# Patient Record
Sex: Female | Born: 1980 | Hispanic: Yes | Marital: Married | Smoking: Never smoker
Health system: Southern US, Community
[De-identification: ages and names within clinical notes are randomized; demographics above are authoritative.]

## PROBLEM LIST (undated history)

## (undated) DIAGNOSIS — G43909 Migraine, unspecified, not intractable, without status migrainosus: Secondary | ICD-10-CM

## (undated) DIAGNOSIS — G932 Benign intracranial hypertension: Secondary | ICD-10-CM

## (undated) HISTORY — PX: CYST EXCISION: SHX5701

## (undated) HISTORY — PX: TUBAL LIGATION: SHX77

## (undated) HISTORY — PX: MOUTH SURGERY: SHX715

---

## 2013-05-21 ENCOUNTER — Emergency Department: Payer: Self-pay | Admitting: Emergency Medicine

## 2013-06-09 ENCOUNTER — Encounter (HOSPITAL_COMMUNITY): Payer: Self-pay | Admitting: Emergency Medicine

## 2013-06-09 ENCOUNTER — Emergency Department (HOSPITAL_COMMUNITY): Payer: Medicaid - Out of State

## 2013-06-09 ENCOUNTER — Emergency Department (HOSPITAL_COMMUNITY)
Admission: EM | Admit: 2013-06-09 | Discharge: 2013-06-10 | Disposition: A | Discharge status: Home / Self Care | Payer: Medicaid - Out of State | Attending: Emergency Medicine | Admitting: Emergency Medicine

## 2013-06-09 DIAGNOSIS — M5126 Other intervertebral disc displacement, lumbar region: Secondary | ICD-10-CM | POA: Insufficient documentation

## 2013-06-09 DIAGNOSIS — X500XXA Overexertion from strenuous movement or load, initial encounter: Secondary | ICD-10-CM | POA: Insufficient documentation

## 2013-06-09 DIAGNOSIS — Y9389 Activity, other specified: Secondary | ICD-10-CM | POA: Insufficient documentation

## 2013-06-09 DIAGNOSIS — Z8679 Personal history of other diseases of the circulatory system: Secondary | ICD-10-CM | POA: Insufficient documentation

## 2013-06-09 DIAGNOSIS — Y990 Civilian activity done for income or pay: Secondary | ICD-10-CM | POA: Insufficient documentation

## 2013-06-09 DIAGNOSIS — Z8669 Personal history of other diseases of the nervous system and sense organs: Secondary | ICD-10-CM | POA: Insufficient documentation

## 2013-06-09 DIAGNOSIS — Y9289 Other specified places as the place of occurrence of the external cause: Secondary | ICD-10-CM | POA: Insufficient documentation

## 2013-06-09 DIAGNOSIS — IMO0002 Reserved for concepts with insufficient information to code with codable children: Secondary | ICD-10-CM | POA: Insufficient documentation

## 2013-06-09 HISTORY — DX: Benign intracranial hypertension: G93.2

## 2013-06-09 HISTORY — DX: Migraine, unspecified, not intractable, without status migrainosus: G43.909

## 2013-06-09 MED ORDER — DIAZEPAM 5 MG PO TABS
5.0000 mg | ORAL_TABLET | Freq: Once | ORAL | Status: AC
Start: 2013-06-09 — End: 2013-06-09
  Administered 2013-06-09: 5 mg via ORAL
  Filled 2013-06-09: qty 1

## 2013-06-09 MED ORDER — HYDROMORPHONE HCL PF 1 MG/ML IJ SOLN
1.0000 mg | Freq: Once | INTRAMUSCULAR | Status: AC
  Administered 2013-06-09: 1 mg via INTRAMUSCULAR
  Filled 2013-06-09: qty 1

## 2013-06-09 MED ORDER — HYDROMORPHONE HCL PF 2 MG/ML IJ SOLN
2.0000 mg | Freq: Once | INTRAMUSCULAR | Status: AC
  Administered 2013-06-09: 2 mg via INTRAVENOUS
  Filled 2013-06-09: qty 1

## 2013-06-09 MED ORDER — METHOCARBAMOL 500 MG PO TABS
1000.0000 mg | ORAL_TABLET | Freq: Once | ORAL | Status: AC
  Administered 2013-06-09: 1000 mg via ORAL
  Filled 2013-06-09: qty 2

## 2013-06-09 MED ORDER — PREDNISONE 20 MG PO TABS
60.0000 mg | ORAL_TABLET | Freq: Once | ORAL | Status: AC
  Administered 2013-06-09: 60 mg via ORAL
  Filled 2013-06-09: qty 3

## 2013-06-09 MED ORDER — KETOROLAC TROMETHAMINE 30 MG/ML IJ SOLN
30.0000 mg | Freq: Once | INTRAMUSCULAR | Status: AC
  Administered 2013-06-09: 30 mg via INTRAVENOUS
  Filled 2013-06-09: qty 1

## 2013-06-09 NOTE — ED Notes (Signed)
ED PA at bedside

## 2013-06-09 NOTE — ED Notes (Signed)
Pt sts lower back pain with radiation to feet after picking up something at work today; pt appears very uncomfortable at present

## 2013-06-09 NOTE — ED Provider Notes (Signed)
CSN: [id number]     Arrival date & time 06/09/13  1849 History   First MD Initiated Contact with Patient 06/09/13 1904     Chief Complaint  Patient presents with  . Back Pain   (Consider location/radiation/quality/duration/timing/severity/associated sxs/prior Treatment) HPI 74 YOF presents to the emergency department with low back pain radiating into her legs bilaterally that began at work today. She bent over at work quickly to pick up a glass of water when she heard a "pop like a band" in her lower back. She was unable to stand up initially and was eventually able to stand up. She walked with a limp away. Pain started immediately and is a "20/10" stabbing, tingling, burning pain that radiates into her heels. She now has tingling in her heels and feet. Denies weakness, saddle anesthesia, bowel and bladder incontinence or dysfunction, numbness, fever, chills, shortness of breath, chest pain. Tried taking 3 Advil with no relief. Nothing makes the pain better; movement, standing, walking, laying for too long makes the pain worse.  Past Medical History  Diagnosis Date  . Pseudotumor cerebri   . Migraine    History reviewed. No pertinent past surgical history. History reviewed. No pertinent family history. History  Substance Use Topics  . Smoking status: Never Smoker   . Smokeless tobacco: Not on file  . Alcohol Use: Yes     Comment: occ   OB History   Grav Para Term Preterm Abortions TAB SAB Ect Mult Living                 Review of Systems All other systems negative except as documented in the HPI. All pertinent positives and negatives as reviewed in the HPI.  Allergies  Review of patient's allergies indicates no known allergies.  Home Medications   Current Outpatient Rx  Name  Route  Sig  Dispense  Refill  . ibuprofen (ADVIL,MOTRIN) 200 MG tablet   Oral   Take 600 mg by mouth daily as needed for pain.          BP 138/98  Pulse 90  Temp(Src) 97.3 F (36.3 C) (Oral)   Resp 22  SpO2 99%  LMP 05/19/2013 Physical Exam  Nursing note and vitals reviewed. Constitutional: She is oriented to person, place, and time. Vital signs are normal. She appears well-developed and well-nourished. She appears distressed.  Patient is rolling in bed moaning with pain  HENT:  Head: Normocephalic and atraumatic.  Eyes: Pupils are equal, round, and reactive to light.  Neck: Normal range of motion and full passive range of motion without pain.  Cardiovascular: Normal rate, regular rhythm, normal heart sounds, intact distal pulses and normal pulses.   Pulmonary/Chest: Effort normal and breath sounds normal.  Musculoskeletal: She exhibits tenderness.       Right knee: Normal.       Left knee: Normal.       Right ankle: Normal.       Left ankle: Normal. Achilles tendon normal.       Cervical back: Normal.       Thoracic back: Normal.       Lumbar back: She exhibits decreased range of motion, tenderness and bony tenderness. She exhibits no swelling, no edema and no deformity.       Back:       Right foot: She exhibits tenderness (tenderness worse over heel). She exhibits normal range of motion (full ROM with pain), no bony tenderness, no swelling, normal capillary refill and no crepitus.  Left foot: She exhibits tenderness (tenderness worse over heel). She exhibits normal range of motion (ROM full with pain), no bony tenderness, no swelling, normal capillary refill and no laceration.  Neurological: She is alert and oriented to person, place, and time. She has normal strength and normal reflexes. No sensory deficit.  Reflex Scores:      Patellar reflexes are 2+ on the right side and 2+ on the left side. Skin: Skin is warm and dry.  Psychiatric: She has a normal mood and affect. Her behavior is normal. Judgment and thought content normal.    ED Course  Procedures (including critical care time) Labs Review Labs Reviewed - No data to display Imaging Review Dg Lumbar Spine  Complete  06/09/2013   CLINICAL DATA:  Back pain.  EXAM: LUMBAR SPINE - COMPLETE 4+ VIEW  COMPARISON:  No priors.  FINDINGS: Five views of the lumbar spine demonstrate no definite acute displaced fractures or compression type fractures. Alignment is anatomic. Possible left-sided pars defect at L5 (likely chronic). Multiple Schmorl's nodes.  IMPRESSION: 1. No acute radiographic abnormality of the lumbar spine to account for the patient's symptoms. 2. Additional incidental findings, as above.   Electronically Signed   By: Trudie Reed M.D.   On: 06/09/2013 19:58    MDM  Patient is given the findings on MRI.  Patient is advised to followup with neurosurgery in the [facility].  The patient does not have any neurological deficits and normal reflexes.  Patient's pain has improved following treatment here in the emergency department.    Carlyle Dolly, PA-C 06/10/13 [phone]

## 2013-06-09 NOTE — ED Notes (Signed)
Patient was at work today around noon drinking from a water bottle, she dropped it and when she bent over to pick it up she heard a sound like a rubber band breaking, she was unable to move or stand up straight, she was eventually able to bend and stand straight after , she completed her shift at work and then came to the hospital for evaluation

## 2013-06-10 MED ORDER — HYDROMORPHONE HCL PF 2 MG/ML IJ SOLN
2.0000 mg | Freq: Once | INTRAMUSCULAR | Status: DC
  Filled 2013-06-10: qty 1

## 2013-06-10 MED ORDER — PREDNISONE 50 MG PO TABS: 50.0000 mg | ORAL_TABLET | Freq: Every day | ORAL | Status: DC

## 2013-06-10 MED ORDER — OXYCODONE-ACETAMINOPHEN 10-325 MG PO TABS: 1.0000 | ORAL_TABLET | ORAL | Status: DC | PRN

## 2013-06-10 MED ORDER — DIAZEPAM 5 MG PO TABS: 5.0000 mg | ORAL_TABLET | Freq: Three times a day (TID) | ORAL | Status: DC | PRN

## 2013-06-10 NOTE — ED Notes (Signed)
Pt returned from MRI °

## 2013-06-11 ENCOUNTER — Emergency Department (HOSPITAL_COMMUNITY)
Admission: EM | Admit: 2013-06-11 | Discharge: 2013-06-11 | Disposition: A | Discharge status: Home / Self Care | Payer: Medicaid - Out of State | Attending: Emergency Medicine | Admitting: Emergency Medicine

## 2013-06-11 ENCOUNTER — Encounter (HOSPITAL_COMMUNITY): Payer: Self-pay | Admitting: *Deleted

## 2013-06-11 DIAGNOSIS — M5126 Other intervertebral disc displacement, lumbar region: Secondary | ICD-10-CM

## 2013-06-11 DIAGNOSIS — M79609 Pain in unspecified limb: Secondary | ICD-10-CM | POA: Insufficient documentation

## 2013-06-11 DIAGNOSIS — M549 Dorsalgia, unspecified: Secondary | ICD-10-CM

## 2013-06-11 DIAGNOSIS — M545 Low back pain, unspecified: Secondary | ICD-10-CM | POA: Insufficient documentation

## 2013-06-11 DIAGNOSIS — G932 Benign intracranial hypertension: Secondary | ICD-10-CM | POA: Insufficient documentation

## 2013-06-11 DIAGNOSIS — Z8669 Personal history of other diseases of the nervous system and sense organs: Secondary | ICD-10-CM | POA: Insufficient documentation

## 2013-06-11 MED ORDER — HYDROMORPHONE HCL PF 1 MG/ML IJ SOLN
1.0000 mg | Freq: Once | INTRAMUSCULAR | Status: AC
  Administered 2013-06-11: 1 mg via INTRAMUSCULAR
  Filled 2013-06-11: qty 1

## 2013-06-11 MED ORDER — KETOROLAC TROMETHAMINE 60 MG/2ML IM SOLN
60.0000 mg | Freq: Once | INTRAMUSCULAR | Status: AC
  Administered 2013-06-11: 60 mg via INTRAMUSCULAR
  Filled 2013-06-11: qty 2

## 2013-06-11 MED ORDER — MORPHINE SULFATE 4 MG/ML IJ SOLN: 4.0000 mg | Freq: Once | INTRAMUSCULAR | Status: DC

## 2013-06-11 MED ORDER — METHOCARBAMOL 100 MG/ML IJ SOLN
500.0000 mg | Freq: Once | INTRAMUSCULAR | Status: AC
  Administered 2013-06-11: 500 mg via INTRAMUSCULAR
  Filled 2013-06-11: qty 5

## 2013-06-11 MED ORDER — HYDROMORPHONE HCL PF 2 MG/ML IJ SOLN
2.0000 mg | Freq: Once | INTRAMUSCULAR | Status: AC
  Administered 2013-06-11: 2 mg via INTRAMUSCULAR
  Filled 2013-06-11: qty 1

## 2013-06-11 NOTE — Progress Notes (Signed)
ED CM received consult from [facility] in [place]. Pt presents to ED with back pain. Pt was seen in [facility] ED 2 days ago for same complaint. MRI done and revealed herniated disc. Pt  moved to [place] from [place] 3 weeks ago. Pt reports having state insurance in [place]. Pt states, she has applied for Medicaid in [place]. Provided verbal and written information about the [facility] and [facility] and its services. Discussed the importance and benefits of having a PCP for follow up care. Pt verbalizes understanding and it in agreement. With patient's permission, I will forward information to the scheduler Barbaraann Boys at [facility] to contact for an ED follow up appointment. Verified correct phone number with patient to be [phone]. No further CM needs identified

## 2013-06-11 NOTE — ED Notes (Signed)
Pt has ride home.

## 2013-06-11 NOTE — ED Provider Notes (Signed)
Medical screening examination/treatment/procedure(s) were conducted as a shared visit with non-physician practitioner(s) and myself.  I personally evaluated the patient during the encounter.  MRI reveals a left HNP/annular tear at L5-S1 questionable acute.   No bowel or bladder incontinence. Refer to neurosurgeon  Donnetta Hutching, MD 06/11/13 [phone]

## 2013-06-11 NOTE — ED Notes (Signed)
Pt refused to ambulate. Pt states "I'll walk when my husband picks me up. It hurts too much to walk." MD notified.

## 2013-06-11 NOTE — ED Notes (Signed)
Pt was here two days ago for lower back pain, had MRI done and told she has herniated disc and was dc home with percocet. Reports pain to both legs and pressure when she urinates, diff ambulating.

## 2013-06-11 NOTE — ED Provider Notes (Signed)
CSN: [id number]     Arrival date & time 06/11/13  1248 History   None    Chief Complaint  Patient presents with  . Back Pain   (Consider location/radiation/quality/duration/timing/severity/associated sxs/prior Treatment) Patient is a 32 y.o. female presenting with back pain. The history is provided by the patient. No language interpreter was used.  Back Pain Location:  Lumbar spine Quality:  Stabbing Radiates to:  L posterior upper leg and R posterior upper leg Pain severity:  Moderate Pain is:  Same all the time Onset quality:  Sudden Duration:  4 days Timing:  Constant Progression:  Worsening Chronicity:  New Context comment:  Bending Relieved by:  Nothing Worsened by:  Movement, twisting and bending Ineffective treatments:  Narcotics Associated symptoms: no abdominal pain, no chest pain, no dysuria, no fever, no headaches, no numbness, no paresthesias, no perianal numbness, no tingling and no weakness   Risk factors: obesity     Past Medical History  Diagnosis Date  . Pseudotumor cerebri   . Migraine    History reviewed. No pertinent past surgical history. History reviewed. No pertinent family history. History  Substance Use Topics  . Smoking status: Never Smoker   . Smokeless tobacco: Not on file  . Alcohol Use: Yes     Comment: occ   OB History   Grav Para Term Preterm Abortions TAB SAB Ect Mult Living                 Review of Systems  Constitutional: Negative for fever.  HENT: Negative for congestion, sore throat and rhinorrhea.   Respiratory: Negative for cough and shortness of breath.   Cardiovascular: Negative for chest pain.  Gastrointestinal: Negative for nausea, vomiting, abdominal pain and diarrhea.  Genitourinary: Negative for dysuria, hematuria and flank pain.  Musculoskeletal: Positive for back pain.  Skin: Negative for rash.  Neurological: Negative for tingling, syncope, weakness, light-headedness, numbness, headaches and paresthesias.  All  other systems reviewed and are negative.    Allergies  Review of patient's allergies indicates no known allergies.  Home Medications   Current Outpatient Rx  Name  Route  Sig  Dispense  Refill  . diazepam (VALIUM) 5 MG tablet   Oral   Take 1 tablet (5 mg total) by mouth every 8 (eight) hours as needed for anxiety.   12 tablet   0   . ibuprofen (ADVIL,MOTRIN) 200 MG tablet   Oral   Take 600 mg by mouth daily as needed for pain.         Marland Kitchen oxyCODONE-acetaminophen (PERCOCET) 10-325 MG per tablet   Oral   Take 1 tablet by mouth every 4 (four) hours as needed for pain.   24 tablet   0   . predniSONE (DELTASONE) 50 MG tablet   Oral   Take 1 tablet (50 mg total) by mouth daily.   5 tablet   0    BP 119/60  Pulse 107  Temp(Src) 98.1 F (36.7 C) (Oral)  Resp 22  Ht 5' 6.5" (1.689 m)  Wt 243 lb (110.224 kg)  BMI 38.64 kg/m2  SpO2 96%  LMP 05/19/2013 Physical Exam  Nursing note and vitals reviewed. Constitutional: She is oriented to person, place, and time. She appears well-developed and well-nourished.  HENT:  Head: Normocephalic and atraumatic.  Right Ear: External ear normal.  Left Ear: External ear normal.  Eyes: EOM are normal.  Neck: Normal range of motion. Neck supple.  Cardiovascular: Normal rate, regular rhythm and intact distal  pulses.  Exam reveals no gallop and no friction rub.   No murmur heard. Pulmonary/Chest: Effort normal and breath sounds normal. No respiratory distress. She has no wheezes. She has no rales. She exhibits no tenderness.  Abdominal: Soft. Bowel sounds are normal. She exhibits no distension. There is no tenderness. There is no rebound.  Genitourinary:  Rectal exam: normal sensation, normal rectal tone  Musculoskeletal: Normal range of motion. She exhibits tenderness (lumbar spine). She exhibits no edema.  Lymphadenopathy:    She has no cervical adenopathy.  Neurological: She is alert and oriented to person, place, and time.  Skin:  Skin is warm. No rash noted.  Psychiatric: She has a normal mood and affect. Her behavior is normal.    ED Course  Procedures (including critical care time) Labs Review Labs Reviewed  URINALYSIS, DIPSTICK ONLY   Imaging Review Dg Lumbar Spine Complete  06/09/2013   CLINICAL DATA:  Back pain.  EXAM: LUMBAR SPINE - COMPLETE 4+ VIEW  COMPARISON:  No priors.  FINDINGS: Five views of the lumbar spine demonstrate no definite acute displaced fractures or compression type fractures. Alignment is anatomic. Possible left-sided pars defect at L5 (likely chronic). Multiple Schmorl's nodes.  IMPRESSION: 1. No acute radiographic abnormality of the lumbar spine to account for the patient's symptoms. 2. Additional incidental findings, as above.   Electronically Signed   By: Trudie Reed M.D.   On: 06/09/2013 19:58   Mr Lumbar Spine Wo Contrast  06/10/2013   *RADIOLOGY REPORT*  Clinical Data: New onset severe low back pain, unable to ambulate.  MRI LUMBAR SPINE WITHOUT CONTRAST  Multiplanar and multiecho pulse sequences of the lumbar spine were obtained without intravenous contrast.  Comparison: Lumbar spine radiographs June 09, 2013 at 1948 hours.  Findings: Lumbar vertebral bodies are intact and aligned with maintenance of lumbar lordosis.  Posterior elements appear intact, specifically no pars interarticularis defect.  Using a reference level of the last well formed intervertebral disc as L5 S1, mild L3- 4 disc height loss with low signal within the L3-4 and L5 S1 disc most consistent with mild desiccation.  Scattered chronic Schmorl's nodes.  No abnormal STIR signal to suggest acute process process.  Conus medullaris terminates at L1-2 appear normal in course and caliber.  Borderline congenital canal narrowing.  1-2 mm of bright T1 signal along the filum terminalis with mild chemical shift artifact most consistent with fibrolipomatous change.  No cord tethering.  Level by level evaluation:  L1-2 and L2-3:   No significant disc bulge, canal stenosis or neural foraminal narrowing.  L3-4:  9 x 6 mm fluid signal lesion contiguous with the medial aspect of the right facet, within the dorsal epidural space resulting in mild thecal sac effacement without osseous canal stenosis, axial 18/40.  Minimal annular bulging. Minimal right greater thanleft neural foraminal narrowing.  L4-5:  Minimal annular bulging, mild facet arthropathy without canal stenosis.  Minimal bilateral neural foraminal narrowing.  L5 S1:  3 mm broad-based left paracentral disc protrusion with annular tear and mild effacement left lateral recess may affect the traversing left S1 nerve.  Mild facet arthropathy without canal stenosis.  Minimal neural foraminal  narrowing.  IMPRESSION: Small to moderate left paracentral disc protrusion and annular tear at L5-S1 may be acute, encroaching upon the traversing left S1 nerve.  9 x 6 mm L3-4 dorsal epidural fluid signal lesion may reflect a facet cyst resulting in mild thecal sac effacement. If clinically indicated, this could be further characterized with  contrast enhanced sequences of the lumbar spine.  No canal stenosis.  Minimal L3-4 through L5-S1 neural foraminal narrowing.  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   Original Report Authenticated By: Awilda Metro    MDM   1. Back pain   2. Bulging lumbar disc    2:49 PM Pt is a 32 y.o. female with pertinent PMHX of pseu who presents to the ED with back pain. Back pain for 4 days. Similar to chronic back pain. Denies tingling or numbness. Denies bowel or bladder incontinence. Denies new trauma or injury to back. Denies fever, recent illness. Denies/endorses IVDA. Endorses nausea. Denies vomiting, diarrhea. Denies loss of sensation to perineum. No red flags on history for back pain. Previous imaging of back showed: disc herniation at L5-S1 on Monday. Pt comes back for worsening pain. No history of kidney stone,s no  dysuria.  On exam: AFVSS. Lungs clear, no CVA tenderness.Rectal exam: normal tone, normal sensation low clinical suspicion for cauda equina. No fevers no IVDA low clinical suspicion for discitis. Plan for: acute pain control and discharge. Will hold off on imaging today, given recent MRI and no history of new trauma  Pain somewhat improved with Dilaudid 2 mg, Robaxin IM. Pt able to ambulate some and bear weight, but does not want to ambulate.Pt stated pain continues, will give extra dose of toradol.  On re-eval pt stated pain slightly improved.  Neurologic exam remains unchanged. Pt stated she still does not feel like walking. Will give 1 last dose of Dilaudid and discharge pt home. Pt able to ambulate with some assistance. Instructed pt to continue medications as prescribed and obtain follow up with either neurosurgery or orthopedics and to allow time for steroids to bring down inflammation. Return precautions for cauda equina given.  8:36 PM: I have discussed the diagnosis/risks/treatment options with the patient and believe the pt to be eligible for discharge home to follow-up with Establish PCP in . We also discussed returning to the ED immediately if new or worsening sx occur. We discussed the sx which are most concerning (e.g., loss of sensation, cauda equina) that necessitate immediate return. Any new prescriptions provided to the patient are listed below.   New Prescriptions   No medications on file    The patient appears reasonably screened and/or stabilized for discharge and I doubt any other medical condition or other [facility] requiring further screening, evaluation or treatment in the ED at this time prior to discharge . Pt in agreement with discharge plan. Return precautions given. Pt discharged VSS  Pt was discussed with my attending, Dr. Kavin Leech, MD 06/12/13 [phone]

## 2013-06-12 ENCOUNTER — Encounter: Payer: Self-pay | Admitting: General Practice

## 2013-06-12 NOTE — ED Provider Notes (Signed)
I saw and evaluated the patient, reviewed the resident's note and I agree with the findings and plan.   Patient with normal neuro exam with continued low back pain. Recent MRI in last 2 days with no signs of acute cord malfunction. Feel that there is no cause for repeat imaging at this time. Given symptomatic control, stressed importance of getting a PCP as well as following up with a spine specialist. Stable for discharge.   Audree Camel, MD 06/12/13 1302

## 2013-06-14 ENCOUNTER — Emergency Department: Payer: Self-pay | Admitting: Emergency Medicine

## 2013-06-15 LAB — URINALYSIS, COMPLETE
Ketone: NEGATIVE
Nitrite: NEGATIVE
Ph: 5 (ref 4.5–8.0)
Protein: NEGATIVE
RBC,UR: 10 /HPF (ref 0–5)
Specific Gravity: 1.01 (ref 1.003–1.030)
Squamous Epithelial: 14
WBC UR: 152 /HPF (ref 0–5)

## 2013-07-13 ENCOUNTER — Emergency Department: Payer: Self-pay | Admitting: Emergency Medicine

## 2013-09-17 ENCOUNTER — Emergency Department: Payer: Self-pay | Admitting: Emergency Medicine

## 2013-09-17 LAB — URINALYSIS, COMPLETE
Bilirubin,UR: NEGATIVE
Glucose,UR: NEGATIVE mg/dL (ref 0–75)
Ketone: NEGATIVE
Leukocyte Esterase: NEGATIVE
Nitrite: NEGATIVE
Ph: 6 (ref 4.5–8.0)
RBC,UR: <1 /HPF (ref 0–5)
Specific Gravity: 1.025 (ref 1.003–1.030)
WBC UR: 1 /HPF (ref 0–5)

## 2013-09-18 ENCOUNTER — Encounter (HOSPITAL_COMMUNITY): Payer: Self-pay | Admitting: Emergency Medicine

## 2013-09-18 DIAGNOSIS — R109 Unspecified abdominal pain: Secondary | ICD-10-CM | POA: Insufficient documentation

## 2013-09-18 DIAGNOSIS — R300 Dysuria: Secondary | ICD-10-CM | POA: Insufficient documentation

## 2013-09-18 DIAGNOSIS — M5126 Other intervertebral disc displacement, lumbar region: Secondary | ICD-10-CM | POA: Insufficient documentation

## 2013-09-18 DIAGNOSIS — R51 Headache: Secondary | ICD-10-CM | POA: Insufficient documentation

## 2013-09-18 DIAGNOSIS — Z8669 Personal history of other diseases of the nervous system and sense organs: Secondary | ICD-10-CM | POA: Insufficient documentation

## 2013-09-18 DIAGNOSIS — G932 Benign intracranial hypertension: Secondary | ICD-10-CM | POA: Insufficient documentation

## 2013-09-18 DIAGNOSIS — M545 Low back pain, unspecified: Secondary | ICD-10-CM | POA: Insufficient documentation

## 2013-09-18 DIAGNOSIS — R110 Nausea: Secondary | ICD-10-CM | POA: Insufficient documentation

## 2013-09-18 NOTE — ED Notes (Signed)
Presents with lower back pain, recently diagnosed with herniated disc, bent over very fast yesterday and felt a sharp shooting pain down her legs. Then today began having lower abdominal pain, headache, nausea and pressure in pelvic area with urination. Denies vaginal discharge, denies foul odor. LMP 09-02-13. Pain is worse with urination and sitting down. Pt alert, oriented.

## 2013-09-19 ENCOUNTER — Emergency Department (HOSPITAL_COMMUNITY)
Admission: EM | Admit: 2013-09-19 | Discharge: 2013-09-19 | Discharge status: Against Medical Advice | Payer: Medicaid Other | Attending: Emergency Medicine | Admitting: Emergency Medicine

## 2013-09-19 NOTE — ED Notes (Signed)
Montgomery Cellar documenting:  Pt called with no responce

## 2013-09-20 ENCOUNTER — Encounter (HOSPITAL_COMMUNITY): Payer: Self-pay | Admitting: Emergency Medicine

## 2013-09-20 ENCOUNTER — Emergency Department (HOSPITAL_COMMUNITY)
Admission: EM | Admit: 2013-09-20 | Discharge: 2013-09-20 | Disposition: A | Discharge status: Home / Self Care | Payer: Medicaid Other | Attending: Emergency Medicine | Admitting: Emergency Medicine

## 2013-09-20 DIAGNOSIS — Z8669 Personal history of other diseases of the nervous system and sense organs: Secondary | ICD-10-CM | POA: Insufficient documentation

## 2013-09-20 DIAGNOSIS — R109 Unspecified abdominal pain: Secondary | ICD-10-CM | POA: Insufficient documentation

## 2013-09-20 DIAGNOSIS — M5431 Sciatica, right side: Secondary | ICD-10-CM

## 2013-09-20 DIAGNOSIS — R51 Headache: Secondary | ICD-10-CM | POA: Insufficient documentation

## 2013-09-20 DIAGNOSIS — R110 Nausea: Secondary | ICD-10-CM | POA: Insufficient documentation

## 2013-09-20 DIAGNOSIS — Z8679 Personal history of other diseases of the circulatory system: Secondary | ICD-10-CM | POA: Insufficient documentation

## 2013-09-20 DIAGNOSIS — M5430 Sciatica, unspecified side: Secondary | ICD-10-CM | POA: Insufficient documentation

## 2013-09-20 DIAGNOSIS — N39498 Other specified urinary incontinence: Secondary | ICD-10-CM | POA: Insufficient documentation

## 2013-09-20 MED ORDER — ONDANSETRON 4 MG PO TBDP
8.0000 mg | ORAL_TABLET | Freq: Once | ORAL | Status: AC
  Administered 2013-09-20: 8 mg via ORAL
  Filled 2013-09-20: qty 2

## 2013-09-20 MED ORDER — OXYCODONE-ACETAMINOPHEN 5-325 MG PO TABS
1.0000 | ORAL_TABLET | Freq: Once | ORAL | Status: AC
  Administered 2013-09-20: 1 via ORAL
  Filled 2013-09-20: qty 1

## 2013-09-20 MED ORDER — OXYCODONE-ACETAMINOPHEN 5-325 MG PO TABS: 2.0000 | ORAL_TABLET | ORAL | Status: DC | PRN

## 2013-09-20 MED ORDER — DIAZEPAM 5 MG PO TABS: 5.0000 mg | ORAL_TABLET | Freq: Two times a day (BID) | ORAL | Status: DC

## 2013-09-20 MED ORDER — DIAZEPAM 5 MG PO TABS
5.0000 mg | ORAL_TABLET | Freq: Once | ORAL | Status: AC
Start: 2013-09-20 — End: 2013-09-20
  Administered 2013-09-20: 5 mg via ORAL
  Filled 2013-09-20: qty 1

## 2013-09-20 MED ORDER — PREDNISONE 20 MG PO TABS
60.0000 mg | ORAL_TABLET | Freq: Once | ORAL | Status: AC
  Administered 2013-09-20: 60 mg via ORAL
  Filled 2013-09-20: qty 3

## 2013-09-20 MED ORDER — PREDNISONE 10 MG PO TABS: 20.0000 mg | ORAL_TABLET | Freq: Every day | ORAL | Status: DC

## 2013-09-20 NOTE — ED Notes (Signed)
Pt alert and oriented, with steady gait at time of discharge. Pt given discharge papers and papers explained. All questions answered and pt walked to discharge.  

## 2013-09-20 NOTE — ED Provider Notes (Signed)
CSN: [id number]     Arrival date & time 09/20/13  1753 History  This chart was scribed for Fayrene Helper, PA-C, working with Junius Argyle, MD by Blanchard Kelch, ED Scribe. This patient was seen in room TR10C/TR10C and the patient's care was started at 7:46 PM.    Chief Complaint  Patient presents with  . Back Pain    Patient is a 32 y.o. female presenting with back pain. The history is provided by the patient. No language interpreter was used.  Back Pain Associated symptoms: abdominal pain and headaches   Associated symptoms: no fever and no numbness     HPI Comments: Kristi Gutierrez is a 32 y.o. female with a history of herniated disc (diagnosed 05/2013) who presents to the Emergency Department complaining of constant severe back pain that began four days ago. She states that she bent over suddenly to grab something out the stove when she felt a shooting pain throughout her back. She has had constant pain to the mid back that worsened the morning after the injury occurred. She states the pain radiates down her entire right leg. The pain is worsened by sitting. She also has pressure in her abdomen with urination since the back pain began as well as nausea and headache. She denies bowel incontinence. She denies groin numbness or fever. She has been taking Tylenol and Motrin without relief of pain. She denies a past medical history of diabetes.    Past Medical History  Diagnosis Date  . Pseudotumor cerebri   . Migraine    History reviewed. No pertinent past surgical history. History reviewed. No pertinent family history. History  Substance Use Topics  . Smoking status: Never Smoker   . Smokeless tobacco: Not on file  . Alcohol Use: Yes     Comment: occ   OB History   Grav Para Term Preterm Abortions TAB SAB Ect Mult Living                 Review of Systems  Constitutional: Negative for fever.  Gastrointestinal: Positive for nausea and abdominal pain.  Musculoskeletal: Positive  for back pain and gait problem.  Neurological: Positive for headaches. Negative for numbness.    Allergies  Review of patient's allergies indicates no known allergies.  Home Medications  No current outpatient prescriptions on file. Triage Vitals: BP 123/98  Pulse 99  Temp(Src) 97.3 F (36.3 C) (Oral)  Resp 22  Ht 5\' 6"  (1.676 m)  Wt 243 lb (110.224 kg)  BMI 39.24 kg/m2  SpO2 95%  LMP 08/28/2013  Physical Exam  Nursing note and vitals reviewed. Constitutional: She is oriented to person, place, and time. She appears well-developed and well-nourished. No distress.  HENT:  Head: Normocephalic and atraumatic.  Eyes: EOM are normal.  Neck: Neck supple. No tracheal deviation present.  Cardiovascular: Normal rate.   Pulmonary/Chest: Effort normal. No respiratory distress.  Abdominal: Soft. She exhibits no distension. There is no tenderness.  Genitourinary:  No cva tenderness  Musculoskeletal: Normal range of motion. She exhibits tenderness.  Tenderness to lumbar midline and paraspinal with no crepitus or step offs. Positive straight leg raise. Pedal pulses intact. 4/4 strength in lower extremities.   Neurological: She is alert and oriented to person, place, and time.  Skin: Skin is warm and dry.  Psychiatric: She has a normal mood and affect. Her behavior is normal.    ED Course  Procedures (including critical care time)  DIAGNOSTIC STUDIES: Oxygen Saturation is 95% on room  air, adequate by my interpretation.    COORDINATION OF CARE: 8:00 PM -Clinical suspicion of sciatica. Will order medication to help with pain. Patient verbalizes understanding and agrees with treatment plan. Pt able to ambulate.  No red flags.    Labs Review Labs Reviewed - No data to display Imaging Review No results found.  EKG Interpretation   None       MDM   1. Sciatica, right    BP 123/98  Pulse 99  Temp(Src) 97.3 F (36.3 C) (Oral)  Resp 22  Ht 5\' 6"  (1.676 m)  Wt 243 lb  (110.224 kg)  BMI 39.24 kg/m2  SpO2 95%  LMP 08/28/2013   I personally performed the services described in this documentation, which was scribed in my presence. The recorded information has been reviewed and is accurate.     Fayrene Helper, PA-C 09/20/13 2014

## 2013-09-20 NOTE — ED Notes (Signed)
Reports hx of herniated disc, went to bend down on 12/24 and felt a pop and pain to lower back. Ambulatory at triage.

## 2013-09-21 NOTE — ED Provider Notes (Signed)
Medical screening examination/treatment/procedure(s) were performed by non-physician practitioner and as supervising physician I was immediately available for consultation/collaboration.  EKG Interpretation   None         Junius Argyle, MD 09/21/13 1235

## 2013-11-09 ENCOUNTER — Emergency Department: Payer: Self-pay | Admitting: Emergency Medicine

## 2013-11-09 LAB — URINALYSIS, COMPLETE
Bacteria: NONE SEEN
Bilirubin,UR: NEGATIVE
Glucose,UR: NEGATIVE mg/dL (ref 0–75)
Ketone: NEGATIVE
Nitrite: NEGATIVE
Ph: 5 (ref 4.5–8.0)
Protein: 100
RBC,UR: 770 /HPF (ref 0–5)
Specific Gravity: 1.017 (ref 1.003–1.030)
Squamous Epithelial: 16

## 2013-11-09 LAB — COMPREHENSIVE METABOLIC PANEL
Albumin: 3.6 g/dL (ref 3.4–5.0)
Alkaline Phosphatase: 89 U/L
Anion Gap: 5 — ABNORMAL LOW (ref 7–16)
BUN: 14 mg/dL (ref 7–18)
Bilirubin,Total: 0.5 mg/dL (ref 0.2–1.0)
CALCIUM: 9.3 mg/dL (ref 8.5–10.1)
Chloride: 106 mmol/L (ref 98–107)
Co2: 26 mmol/L (ref 21–32)
Creatinine: 0.75 mg/dL (ref 0.60–1.30)
EGFR (Non-African Amer.): >60
GLUCOSE: 84 mg/dL (ref 65–99)
Osmolality: 273 (ref 275–301)
Potassium: 4.5 mmol/L (ref 3.5–5.1)
SGOT(AST): 33 U/L (ref 15–37)
SGPT (ALT): 56 U/L (ref 12–78)
Sodium: 137 mmol/L (ref 136–145)
Total Protein: 8 g/dL (ref 6.4–8.2)

## 2013-11-09 LAB — CBC WITH DIFFERENTIAL/PLATELET
Basophil #: 0.1 10*3/uL (ref 0.0–0.1)
Basophil %: 0.6 %
EOS ABS: 0.1 10*3/uL (ref 0.0–0.7)
Eosinophil %: 0.9 %
HCT: 38.8 % (ref 35.0–47.0)
HGB: 13.4 g/dL (ref 12.0–16.0)
Lymphocyte #: 3 10*3/uL (ref 1.0–3.6)
Lymphocyte %: 22.9 %
MCH: 30.9 pg (ref 26.0–34.0)
MCHC: 34.6 g/dL (ref 32.0–36.0)
MCV: 90 fL (ref 80–100)
Monocyte #: 1.1 x10 3/mm — ABNORMAL HIGH (ref 0.2–0.9)
Monocyte %: 8.2 %
NEUTROS ABS: 8.9 10*3/uL — AB (ref 1.4–6.5)
Neutrophil %: 67.4 %
PLATELETS: 221 10*3/uL (ref 150–440)
RBC: 4.33 10*6/uL (ref 3.80–5.20)
RDW: 12.5 % (ref 11.5–14.5)
WBC: 13.2 10*3/uL — AB (ref 3.6–11.0)

## 2013-11-09 LAB — WET PREP, GENITAL

## 2013-11-09 LAB — GC/CHLAMYDIA PROBE AMP

## 2013-11-11 LAB — URINE CULTURE

## 2013-12-01 ENCOUNTER — Emergency Department (HOSPITAL_COMMUNITY)
Admission: EM | Admit: 2013-12-01 | Discharge: 2013-12-01 | Disposition: A | Discharge status: Home / Self Care | Payer: Medicaid Other | Attending: Emergency Medicine | Admitting: Emergency Medicine

## 2013-12-01 ENCOUNTER — Encounter (HOSPITAL_COMMUNITY): Payer: Self-pay | Admitting: Emergency Medicine

## 2013-12-01 DIAGNOSIS — Z3202 Encounter for pregnancy test, result negative: Secondary | ICD-10-CM | POA: Insufficient documentation

## 2013-12-01 DIAGNOSIS — R21 Rash and other nonspecific skin eruption: Secondary | ICD-10-CM | POA: Insufficient documentation

## 2013-12-01 DIAGNOSIS — R51 Headache: Secondary | ICD-10-CM

## 2013-12-01 DIAGNOSIS — R519 Headache, unspecified: Secondary | ICD-10-CM

## 2013-12-01 DIAGNOSIS — G43909 Migraine, unspecified, not intractable, without status migrainosus: Secondary | ICD-10-CM | POA: Insufficient documentation

## 2013-12-01 LAB — POC URINE PREG, ED: Preg Test, Ur: NEGATIVE

## 2013-12-01 MED ORDER — METOCLOPRAMIDE HCL 5 MG/ML IJ SOLN
10.0000 mg | Freq: Once | INTRAMUSCULAR | Status: AC
  Administered 2013-12-01: 10 mg via INTRAVENOUS
  Filled 2013-12-01: qty 2

## 2013-12-01 MED ORDER — SODIUM CHLORIDE 0.9 % IV BOLUS (SEPSIS)
1000.0000 mL | Freq: Once | INTRAVENOUS | Status: AC
  Administered 2013-12-01: 1000 mL via INTRAVENOUS

## 2013-12-01 MED ORDER — KETOROLAC TROMETHAMINE 30 MG/ML IJ SOLN
30.0000 mg | Freq: Once | INTRAMUSCULAR | Status: AC
Start: 2013-12-01 — End: 2013-12-01
  Administered 2013-12-01: 30 mg via INTRAVENOUS
  Filled 2013-12-01: qty 1

## 2013-12-01 MED ORDER — IBUPROFEN 800 MG PO TABS: 800.0000 mg | ORAL_TABLET | Freq: Three times a day (TID) | ORAL | Status: DC | PRN

## 2013-12-01 NOTE — ED Notes (Signed)
IV start attempted but was unsuccessful.

## 2013-12-01 NOTE — Discharge Instructions (Signed)
Migraine Headache A migraine headache is an intense, throbbing pain on one or both sides of your head. A migraine can last for 30 minutes to several hours. CAUSES  The exact cause of a migraine headache is not always known. However, a migraine may be caused when nerves in the brain become irritated and release chemicals that cause inflammation. This causes pain. Certain things may also trigger migraines, such as:  Alcohol.  Smoking.  Stress.  Menstruation.  Aged cheeses.  Foods or drinks that contain nitrates, glutamate, aspartame, or tyramine.  Lack of sleep.  Chocolate.  Caffeine.  Hunger.  Physical exertion.  Fatigue.  Medicines used to treat chest pain (nitroglycerine), birth control pills, estrogen, and some blood pressure medicines. SIGNS AND SYMPTOMS  Pain on one or both sides of your head.  Pulsating or throbbing pain.  Severe pain that prevents daily activities.  Pain that is aggravated by any physical activity.  Nausea, vomiting, or both.  Dizziness.  Pain with exposure to bright lights, loud noises, or activity.  General sensitivity to bright lights, loud noises, or smells. Before you get a migraine, you may get warning signs that a migraine is coming (aura). An aura may include:  Seeing flashing lights.  Seeing bright spots, halos, or zig-zag lines.  Having tunnel vision or blurred vision.  Having feelings of numbness or tingling.  Having trouble talking.  Having muscle weakness. DIAGNOSIS  A migraine headache is often diagnosed based on:  Symptoms.  Physical exam.  A CT scan or MRI of your head. These imaging tests cannot diagnose migraines, but they can help rule out other causes of headaches. TREATMENT Medicines may be given for pain and nausea. Medicines can also be given to help prevent recurrent migraines.  HOME CARE INSTRUCTIONS  Only take over-the-counter or prescription medicines for pain or discomfort as directed by your  health care provider. The use of long-term narcotics is not recommended.  Lie down in a dark, quiet room when you have a migraine.  Keep a journal to find out what may trigger your migraine headaches. For example, write down:  What you eat and drink.  How much sleep you get.  Any change to your diet or medicines.  Limit alcohol consumption.  Quit smoking if you smoke.  Get 7 9 hours of sleep, or as recommended by your health care provider.  Limit stress.  Keep lights dim if bright lights bother you and make your migraines worse. SEEK IMMEDIATE MEDICAL CARE IF:   Your migraine becomes severe.  You have a fever.  You have a stiff neck.  You have vision loss.  You have muscular weakness or loss of muscle control.  You start losing your balance or have trouble walking.  You feel faint or pass out.  You have severe symptoms that are different from your first symptoms. MAKE SURE YOU:   Understand these instructions.  Will watch your condition.  Will get help right away if you are not doing well or get worse. Document Released: 09/10/2005 Document Revised: 07/01/2013 Document Reviewed: 05/18/2013 [employer] Patient Information 2014 [employer].  

## 2013-12-01 NOTE — ED Notes (Signed)
Patient C/O having a headache for 3 days.  C/O nausea and vomiting and inability to eat.  She also C/o having a rash and swelling in her left AC.

## 2013-12-01 NOTE — ED Provider Notes (Signed)
CSN: [id number]     Arrival date & time 12/01/13  1846 History   First MD Initiated Contact with Patient 12/01/13 2148     Chief Complaint  Patient presents with  . Headache     (Consider location/radiation/quality/duration/timing/severity/associated sxs/prior Treatment) Patient is a 33 y.o. female presenting with headaches.  Headache  Pt with history of migraine as well as pseudotumor cerebri reports 3 days of throbbing frontal headache, associated with nausea, photophobia similar to previous and not improved with OTC meds. She reports this headache is similar to multiple prior migraines and not similar to headache associated with increased CSF pressures. Does not feel like she needs LP. She reports she was previously on Topamax and Diamox but no longer taking anything since moving to this area several months ago. Pt also reports a red rash on her L antecubital area, not itchy, makes her arm feel heavy.   Past Medical History  Diagnosis Date  . Pseudotumor cerebri   . Migraine    History reviewed. No pertinent past surgical history. History reviewed. No pertinent family history. History  Substance Use Topics  . Smoking status: Never Smoker   . Smokeless tobacco: Not on file  . Alcohol Use: Yes     Comment: occ   OB History   Grav Para Term Preterm Abortions TAB SAB Ect Mult Living                 Review of Systems  Neurological: Positive for headaches.   All other systems reviewed and are negative except as noted in HPI.     Allergies  Review of patient's allergies indicates no known allergies.  Home Medications  No current outpatient prescriptions on file. BP 128/70  Pulse 101  Temp(Src) 98.3 F (36.8 C) (Oral)  Resp 20  Ht 5\' 6"  (1.676 m)  Wt 243 lb (110.224 kg)  BMI 39.24 kg/m2  SpO2 99%  LMP 11/25/2013 Physical Exam  Nursing note and vitals reviewed. Constitutional: She is oriented to person, place, and time. She appears well-developed and  well-nourished.  HENT:  Head: Normocephalic and atraumatic.  Eyes: EOM are normal. Pupils are equal, round, and reactive to light.  Neck: Normal range of motion. Neck supple.  Cardiovascular: Normal rate, normal heart sounds and intact distal pulses.   Pulmonary/Chest: Effort normal and breath sounds normal.  Abdominal: Bowel sounds are normal. She exhibits no distension. There is no tenderness.  Musculoskeletal: Normal range of motion. She exhibits no edema and no tenderness.  Neurological: She is alert and oriented to person, place, and time. She has normal strength. No cranial nerve deficit or sensory deficit.  Skin: Skin is warm and dry. No rash noted.  Psychiatric: She has a normal mood and affect.    ED Course  Procedures (including critical care time) Labs Review Labs Reviewed  POC URINE PREG, ED   Imaging Review No results found.   EKG Interpretation None      MDM   Final diagnoses:  Headache    Pt feeling better, ready to go home. Referred to Neuro for long term management of headaches.     Charles B. Karle Starch, MD 12/01/13 2348

## 2013-12-01 NOTE — ED Notes (Signed)
Per pt sts 2 days of migraine associated with nausea. sts taking OTC migraine medication without relief. sts also arm pain associated with a rash.

## 2013-12-10 ENCOUNTER — Emergency Department: Payer: Self-pay | Admitting: Emergency Medicine

## 2014-01-13 ENCOUNTER — Encounter (HOSPITAL_COMMUNITY): Payer: Self-pay | Admitting: Emergency Medicine

## 2014-01-13 ENCOUNTER — Emergency Department (HOSPITAL_COMMUNITY)
Admission: EM | Admit: 2014-01-13 | Discharge: 2014-01-14 | Disposition: A | Discharge status: Home / Self Care | Payer: Medicaid Other | Attending: Emergency Medicine | Admitting: Emergency Medicine

## 2014-01-13 DIAGNOSIS — Z3202 Encounter for pregnancy test, result negative: Secondary | ICD-10-CM | POA: Insufficient documentation

## 2014-01-13 DIAGNOSIS — R110 Nausea: Secondary | ICD-10-CM | POA: Insufficient documentation

## 2014-01-13 DIAGNOSIS — Z8669 Personal history of other diseases of the nervous system and sense organs: Secondary | ICD-10-CM | POA: Insufficient documentation

## 2014-01-13 DIAGNOSIS — Z8679 Personal history of other diseases of the circulatory system: Secondary | ICD-10-CM | POA: Insufficient documentation

## 2014-01-13 DIAGNOSIS — N12 Tubulo-interstitial nephritis, not specified as acute or chronic: Secondary | ICD-10-CM

## 2014-01-13 DIAGNOSIS — R109 Unspecified abdominal pain: Secondary | ICD-10-CM | POA: Insufficient documentation

## 2014-01-13 LAB — CBC WITH DIFFERENTIAL/PLATELET
Basophils Absolute: 0 10*3/uL (ref 0.0–0.1)
Basophils Relative: 0 % (ref 0–1)
Eosinophils Absolute: 0.1 10*3/uL (ref 0.0–0.7)
Eosinophils Relative: 1 % (ref 0–5)
HCT: 39.7 % (ref 36.0–46.0)
HEMOGLOBIN: 13.7 g/dL (ref 12.0–15.0)
LYMPHS PCT: 28 % (ref 12–46)
Lymphs Abs: 3.6 10*3/uL (ref 0.7–4.0)
MCH: 31 pg (ref 26.0–34.0)
MCHC: 34.5 g/dL (ref 30.0–36.0)
MCV: 89.8 fL (ref 78.0–100.0)
MONOS PCT: 7 % (ref 3–12)
Monocytes Absolute: 0.9 10*3/uL (ref 0.1–1.0)
NEUTROS ABS: 8.6 10*3/uL — AB (ref 1.7–7.7)
Neutrophils Relative %: 64 % (ref 43–77)
Platelets: 200 10*3/uL (ref 150–400)
RBC: 4.42 MIL/uL (ref 3.87–5.11)
RDW: 12.4 % (ref 11.5–15.5)
WBC: 13.2 10*3/uL — AB (ref 4.0–10.5)

## 2014-01-13 LAB — URINALYSIS, ROUTINE W REFLEX MICROSCOPIC
Bilirubin Urine: NEGATIVE
GLUCOSE, UA: NEGATIVE mg/dL
Ketones, ur: NEGATIVE mg/dL
Nitrite: NEGATIVE
PH: 5.5 (ref 5.0–8.0)
PROTEIN: 100 mg/dL — AB
SPECIFIC GRAVITY, URINE: 1.029 (ref 1.005–1.030)
Urobilinogen, UA: 1 mg/dL (ref 0.0–1.0)

## 2014-01-13 LAB — URINE MICROSCOPIC-ADD ON

## 2014-01-13 LAB — PREGNANCY, URINE: PREG TEST UR: NEGATIVE

## 2014-01-13 MED ORDER — MORPHINE SULFATE 4 MG/ML IJ SOLN
4.0000 mg | Freq: Once | INTRAMUSCULAR | Status: AC
Start: 2014-01-13 — End: 2014-01-14
  Administered 2014-01-14: 4 mg via INTRAVENOUS
  Filled 2014-01-13: qty 1

## 2014-01-13 MED ORDER — ONDANSETRON HCL 4 MG/2ML IJ SOLN
4.0000 mg | Freq: Once | INTRAMUSCULAR | Status: AC
  Administered 2014-01-14: 4 mg via INTRAVENOUS
  Filled 2014-01-13: qty 2

## 2014-01-13 MED ORDER — DEXTROSE 5 % IV SOLN
1.0000 g | Freq: Once | INTRAVENOUS | Status: AC
  Administered 2014-01-14: 1 g via INTRAVENOUS
  Filled 2014-01-13: qty 10

## 2014-01-13 NOTE — ED Provider Notes (Addendum)
CSN: [id number]     Arrival date & time 01/13/14  2043 History   First MD Initiated Contact with Patient 01/13/14 2301     Chief Complaint  Patient presents with  . Dysuria     (Consider location/radiation/quality/duration/timing/severity/associated sxs/prior Treatment) HPI Comments: Pt comes in with cc of lower quadrant abd pain. She has no medical hx. Pain started today, and is described as cramping pain in the lower quadrants, and moving to the left side. Pt has some urgency, and discomfort with urination. No hx of stones, STD and no risk factors for STD. No vaginal bleeding or discharge, but there is some pinkish urine when she cleans.  Patient is a 33 y.o. female presenting with dysuria. The history is provided by the patient.  Dysuria Associated symptoms: abdominal pain and nausea   Associated symptoms: no vomiting     Past Medical History  Diagnosis Date  . Pseudotumor cerebri   . Migraine    History reviewed. No pertinent past surgical history. No family history on file. History  Substance Use Topics  . Smoking status: Never Smoker   . Smokeless tobacco: Not on file  . Alcohol Use: Yes     Comment: occ   OB History   Grav Para Term Preterm Abortions TAB SAB Ect Mult Living                 Review of Systems  Constitutional: Positive for chills. Negative for activity change.  Respiratory: Negative for shortness of breath.   Cardiovascular: Negative for chest pain.  Gastrointestinal: Positive for nausea and abdominal pain. Negative for vomiting.  Genitourinary: Positive for dysuria.  Musculoskeletal: Negative for neck pain.  Neurological: Negative for headaches.      Allergies  Review of patient's allergies indicates no known allergies.  Home Medications   Prior to Admission medications   Not on File   BP 150/50  Pulse 94  Temp(Src) 98.2 F (36.8 C)  Resp 18  SpO2 98%  LMP 12/29/2013 Physical Exam  Nursing note and vitals  reviewed. Constitutional: She is oriented to person, place, and time. She appears well-developed and well-nourished.  HENT:  Head: Normocephalic and atraumatic.  Eyes: EOM are normal. Pupils are equal, round, and reactive to light.  Neck: Neck supple.  Cardiovascular: Normal rate, regular rhythm and normal heart sounds.   No murmur heard. Pulmonary/Chest: Effort normal. No respiratory distress.  Abdominal: Soft. She exhibits no distension. There is tenderness. There is no rebound and no guarding.  Suprapubic, and LLQ tenderness, and left flank tenderness.  Neurological: She is alert and oriented to person, place, and time.  Skin: Skin is warm and dry.    ED Course  Procedures (including critical care time) Labs Review Labs Reviewed  URINALYSIS, ROUTINE W REFLEX MICROSCOPIC - Abnormal; Notable for the following:    APPearance CLOUDY (*)    Hgb urine dipstick LARGE (*)    Protein, ur 100 (*)    Leukocytes, UA MODERATE (*)    All other components within normal limits  URINE MICROSCOPIC-ADD ON - Abnormal; Notable for the following:    Bacteria, UA FEW (*)    All other components within normal limits  URINE CULTURE  PREGNANCY, URINE  CBC WITH DIFFERENTIAL  I-STAT CHEM 8, ED    Imaging Review No results found.   EKG Interpretation None      MDM   Final diagnoses:  None   Young healthy female with dysuria, suprapubic pain, left flank pain. Onset  today. Pt has no peritoneal findings. i suspect that the pain is from bladder spasms - and that she has pyelonephritis, given some nausea and left flank pain.  Pelvic exam didn't show any CMT.  Will d.c with antibiotics, and we will discuss return precautions.   Varney Biles, MD 01/14/14 0143  4:06 AM Pt on reassessment had persistent pain. Unchanged from before. She however started crying in pain - so we added CT abd non contrast, to ensure there was no renal stone, and to ensure there was no signs of inflammation in  her peritoneum - and it's neg.  She has passed po challenge. We will discharge her with pain meds and antibiotics, and i have discussed return precautions.  In light of dirty UA, left sided flank pain, bilateral adnexal tenderness, neg CT - i think she has pyelo. Ovarian torsion extremely unlikely given location of her pain, bilateral nature to pain and all the other findings suggestive of a UTI.  Varney Biles, MD 01/14/14 [phone]

## 2014-01-13 NOTE — ED Notes (Signed)
Pt. reports dysuria and bladder cramping onset today , denies hematuria / no fever or chills. Slight nausea.

## 2014-01-14 ENCOUNTER — Emergency Department (HOSPITAL_COMMUNITY): Payer: Medicaid Other

## 2014-01-14 ENCOUNTER — Encounter (HOSPITAL_COMMUNITY): Payer: Self-pay | Admitting: Radiology

## 2014-01-14 LAB — WET PREP, GENITAL
Trich, Wet Prep: NONE SEEN
Yeast Wet Prep HPF POC: NONE SEEN

## 2014-01-14 LAB — I-STAT CHEM 8, ED
BUN: 12 mg/dL (ref 6–23)
CREATININE: 0.7 mg/dL (ref 0.50–1.10)
Calcium, Ion: 1.17 mmol/L (ref 1.12–1.23)
Chloride: 106 mEq/L (ref 96–112)
Glucose, Bld: 96 mg/dL (ref 70–99)
HCT: 42 % (ref 36.0–46.0)
Hemoglobin: 14.3 g/dL (ref 12.0–15.0)
POTASSIUM: 3.9 meq/L (ref 3.7–5.3)
SODIUM: 141 meq/L (ref 137–147)
TCO2: 21 mmol/L (ref 0–100)

## 2014-01-14 LAB — GC/CHLAMYDIA PROBE AMP
CT Probe RNA: NEGATIVE
GC Probe RNA: NEGATIVE

## 2014-01-14 MED ORDER — HYDROCODONE-ACETAMINOPHEN 5-325 MG PO TABS
2.0000 | ORAL_TABLET | Freq: Once | ORAL | Status: AC
  Administered 2014-01-14: 2 via ORAL
  Filled 2014-01-14: qty 2

## 2014-01-14 MED ORDER — PROMETHAZINE HCL 25 MG RE SUPP: 25.0000 mg | Freq: Four times a day (QID) | RECTAL | Status: DC | PRN

## 2014-01-14 MED ORDER — HYDROCODONE-ACETAMINOPHEN 5-325 MG PO TABS: 1.0000 | ORAL_TABLET | Freq: Four times a day (QID) | ORAL | Status: DC | PRN

## 2014-01-14 MED ORDER — IBUPROFEN 600 MG PO TABS: 600.0000 mg | ORAL_TABLET | Freq: Four times a day (QID) | ORAL | Status: DC | PRN

## 2014-01-14 MED ORDER — KETOROLAC TROMETHAMINE 30 MG/ML IJ SOLN
30.0000 mg | Freq: Once | INTRAMUSCULAR | Status: AC
  Administered 2014-01-14: 30 mg via INTRAVENOUS
  Filled 2014-01-14: qty 1

## 2014-01-14 MED ORDER — ONDANSETRON 8 MG PO TBDP: 8.0000 mg | ORAL_TABLET | Freq: Three times a day (TID) | ORAL | Status: DC | PRN

## 2014-01-14 MED ORDER — CEPHALEXIN 500 MG PO CAPS: 500.0000 mg | ORAL_CAPSULE | Freq: Two times a day (BID) | ORAL | Status: DC

## 2014-01-14 MED ORDER — PHENAZOPYRIDINE HCL 200 MG PO TABS: 200.0000 mg | ORAL_TABLET | Freq: Three times a day (TID) | ORAL | Status: DC

## 2014-01-14 MED ORDER — ONDANSETRON HCL 4 MG/2ML IJ SOLN
4.0000 mg | Freq: Once | INTRAMUSCULAR | Status: AC
  Administered 2014-01-14: 4 mg via INTRAVENOUS
  Filled 2014-01-14: qty 2

## 2014-01-14 NOTE — ED Provider Notes (Signed)
Pelvic exam performed for Dr. Kathrynn Humble. Exam chaperoned.  Normal external female genitalia. Vaginal walls without erythema. Positive white/yellow vaginal discharge. No vaginal bleeding. Vaginal and cervical cultures obtained. Cervix nonfriable, no cervical discharge, no cervical motion tenderness. Bimanual- tenderness suprapubic, right and left adnexal tenderness. Exam limited due to patient's body habitus.  Illene Labrador, PA-C 01/14/14 [place] performed the exam. She communicated her findings for me. I followed up on the results of the swabs.  Varney Biles, MD 01/14/14 [phone]

## 2014-01-14 NOTE — Discharge Instructions (Signed)
We saw you in the ER for the abdominal pain, pain with urination, back pain. All the results in the ER are normal, labs and imaging EXCEPT FOR YOUR URINE. We think you have a KIDNEY INFECTION. Antibiotics, pain meds, and nausea meds prescribed. Return to the ER if the symptoms get worse.   Pyelonephritis, Adult Pyelonephritis is a kidney infection. In general, there are 2 main types of pyelonephritis:  Infections that come on quickly without any warning (acute pyelonephritis).  Infections that persist for a long period of time (chronic pyelonephritis). CAUSES  Two main causes of pyelonephritis are:  Bacteria traveling from the bladder to the kidney. This is a problem especially in pregnant women. The urine in the bladder can become filled with bacteria from multiple causes, including:  Inflammation of the prostate gland (prostatitis).  Sexual intercourse in females.  Bladder infection (cystitis).  Bacteria traveling from the bloodstream to the tissue part of the kidney. Problems that may increase your risk of getting a kidney infection include:  Diabetes.  Kidney stones or bladder stones.  Cancer.  Catheters placed in the bladder.  Other abnormalities of the kidney or ureter. SYMPTOMS   Abdominal pain.  Pain in the side or flank area.  Fever.  Chills.  Upset stomach.  Blood in the urine (dark urine).  Frequent urination.  Strong or persistent urge to urinate.  Burning or stinging when urinating. DIAGNOSIS  Your caregiver may diagnose your kidney infection based on your symptoms. A urine sample may also be taken. TREATMENT  In general, treatment depends on how severe the infection is.   If the infection is mild and caught early, your caregiver may treat you with oral antibiotics and send you home.  If the infection is more severe, the bacteria may have gotten into the bloodstream. This will require intravenous (IV) antibiotics and a hospital stay. Symptoms  may include:  High fever.  Severe flank pain.  Shaking chills.  Even after a hospital stay, your caregiver may require you to be on oral antibiotics for a period of time.  Other treatments may be required depending upon the cause of the infection. HOME CARE INSTRUCTIONS   Take your antibiotics as directed. Finish them even if you start to feel better.  Make an appointment to have your urine checked to make sure the infection is gone.  Drink enough fluids to keep your urine clear or pale yellow.  Take medicines for the bladder if you have urgency and frequency of urination as directed by your caregiver. SEEK IMMEDIATE MEDICAL CARE IF:   You have a fever or persistent symptoms for more than 2-3 days.  You have a fever and your symptoms suddenly get worse.  You are unable to take your antibiotics or fluids.  You develop shaking chills.  You experience extreme weakness or fainting.  There is no improvement after 2 days of treatment. MAKE SURE YOU:  Understand these instructions.  Will watch your condition.  Will get help right away if you are not doing well or get worse. Document Released: 09/10/2005 Document Revised: 03/11/2012 Document Reviewed: 02/14/2011 [facility] Patient Information 2014 [place], [place].

## 2014-01-16 LAB — URINE CULTURE: Colony Count: >=100000

## 2014-01-17 ENCOUNTER — Telehealth (HOSPITAL_BASED_OUTPATIENT_CLINIC_OR_DEPARTMENT_OTHER): Payer: Self-pay | Admitting: Emergency Medicine

## 2014-01-17 NOTE — Telephone Encounter (Signed)
Post ED Visit - Positive Culture Follow-up  Culture report reviewed by antimicrobial stewardship pharmacist: []  Wes [place], Pharm.D., BCPS []  Heide Guile, Pharm.D., BCPS [x]  Alycia Rossetti, Pharm.D., BCPS []  [place], [place].D., BCPS, AAHIVP []  Legrand Como, Pharm.D., BCPS, AAHIVP []  Juliene Pina, Pharm.D.  Positive urine culture Treated with Keflex, organism sensitive to the same and no further patient follow-up is required at this time.  [name] 01/17/2014, 3:05 PM

## 2014-02-08 ENCOUNTER — Emergency Department: Payer: Self-pay | Admitting: Emergency Medicine

## 2014-02-08 LAB — URINALYSIS, COMPLETE
BILIRUBIN, UR: NEGATIVE
Glucose,UR: NEGATIVE mg/dL (ref 0–75)
Ketone: NEGATIVE
NITRITE: NEGATIVE
Ph: 5 (ref 4.5–8.0)
Protein: NEGATIVE
Specific Gravity: 1.021 (ref 1.003–1.030)
WBC UR: 35 /HPF (ref 0–5)

## 2014-02-08 LAB — CBC WITH DIFFERENTIAL/PLATELET
BASOS ABS: 0 10*3/uL (ref 0.0–0.1)
BASOS PCT: 0.3 %
EOS ABS: 0.1 10*3/uL (ref 0.0–0.7)
Eosinophil %: 0.7 %
HCT: 39.3 % (ref 35.0–47.0)
HGB: 13.1 g/dL (ref 12.0–16.0)
LYMPHS ABS: 4 10*3/uL — AB (ref 1.0–3.6)
Lymphocyte %: 28.7 %
MCH: 30.1 pg (ref 26.0–34.0)
MCHC: 33.3 g/dL (ref 32.0–36.0)
MCV: 90 fL (ref 80–100)
Monocyte #: 1.2 x10 3/mm — ABNORMAL HIGH (ref 0.2–0.9)
Monocyte %: 8.7 %
Neutrophil #: 8.6 10*3/uL — ABNORMAL HIGH (ref 1.4–6.5)
Neutrophil %: 61.6 %
Platelet: 210 10*3/uL (ref 150–440)
RBC: 4.35 10*6/uL (ref 3.80–5.20)
RDW: 12.4 % (ref 11.5–14.5)
WBC: 13.9 10*3/uL — ABNORMAL HIGH (ref 3.6–11.0)

## 2014-02-08 LAB — COMPREHENSIVE METABOLIC PANEL
ALBUMIN: 3.7 g/dL (ref 3.4–5.0)
Alkaline Phosphatase: 88 U/L
Anion Gap: 4 — ABNORMAL LOW (ref 7–16)
BUN: 13 mg/dL (ref 7–18)
Bilirubin,Total: 0.2 mg/dL (ref 0.2–1.0)
Calcium, Total: 8.9 mg/dL (ref 8.5–10.1)
Chloride: 106 mmol/L (ref 98–107)
Co2: 26 mmol/L (ref 21–32)
Creatinine: 1.1 mg/dL (ref 0.60–1.30)
EGFR (African American): >60
GLUCOSE: 103 mg/dL — AB (ref 65–99)
Osmolality: 272 (ref 275–301)
Potassium: 3.7 mmol/L (ref 3.5–5.1)
SGOT(AST): 38 U/L — ABNORMAL HIGH (ref 15–37)
SGPT (ALT): 60 U/L (ref 12–78)
Sodium: 136 mmol/L (ref 136–145)
TOTAL PROTEIN: 7.8 g/dL (ref 6.4–8.2)

## 2014-02-08 LAB — HCG, QUANTITATIVE, PREGNANCY: Beta Hcg, Quant.: <1 m[IU]/mL — ABNORMAL LOW

## 2014-02-28 ENCOUNTER — Encounter (HOSPITAL_COMMUNITY): Payer: Self-pay | Admitting: Emergency Medicine

## 2014-02-28 DIAGNOSIS — R350 Frequency of micturition: Secondary | ICD-10-CM | POA: Insufficient documentation

## 2014-02-28 DIAGNOSIS — R1032 Left lower quadrant pain: Secondary | ICD-10-CM | POA: Insufficient documentation

## 2014-02-28 DIAGNOSIS — Z3202 Encounter for pregnancy test, result negative: Secondary | ICD-10-CM | POA: Insufficient documentation

## 2014-02-28 DIAGNOSIS — Z8679 Personal history of other diseases of the circulatory system: Secondary | ICD-10-CM | POA: Insufficient documentation

## 2014-02-28 DIAGNOSIS — R300 Dysuria: Secondary | ICD-10-CM | POA: Insufficient documentation

## 2014-02-28 DIAGNOSIS — K5710 Diverticulosis of small intestine without perforation or abscess without bleeding: Secondary | ICD-10-CM | POA: Insufficient documentation

## 2014-02-28 DIAGNOSIS — R319 Hematuria, unspecified: Secondary | ICD-10-CM | POA: Insufficient documentation

## 2014-02-28 LAB — CBC WITH DIFFERENTIAL/PLATELET
Basophils Absolute: 0 10*3/uL (ref 0.0–0.1)
Basophils Relative: 0 % (ref 0–1)
Eosinophils Absolute: 0.1 10*3/uL (ref 0.0–0.7)
Eosinophils Relative: 1 % (ref 0–5)
HEMATOCRIT: 38.6 % (ref 36.0–46.0)
HEMOGLOBIN: 13 g/dL (ref 12.0–15.0)
Lymphocytes Relative: 35 % (ref 12–46)
Lymphs Abs: 3.7 10*3/uL (ref 0.7–4.0)
MCH: 30.2 pg (ref 26.0–34.0)
MCHC: 33.7 g/dL (ref 30.0–36.0)
MCV: 89.8 fL (ref 78.0–100.0)
MONOS PCT: 9 % (ref 3–12)
Monocytes Absolute: 1 10*3/uL (ref 0.1–1.0)
NEUTROS ABS: 5.8 10*3/uL (ref 1.7–7.7)
NEUTROS PCT: 55 % (ref 43–77)
Platelets: 208 10*3/uL (ref 150–400)
RBC: 4.3 MIL/uL (ref 3.87–5.11)
RDW: 12.2 % (ref 11.5–15.5)
WBC: 10.6 10*3/uL — AB (ref 4.0–10.5)

## 2014-02-28 LAB — COMPREHENSIVE METABOLIC PANEL
ALK PHOS: 87 U/L (ref 39–117)
ALT: 52 U/L — ABNORMAL HIGH (ref 0–35)
AST: 31 U/L (ref 0–37)
Albumin: 3.9 g/dL (ref 3.5–5.2)
BILIRUBIN TOTAL: 0.4 mg/dL (ref 0.3–1.2)
BUN: 14 mg/dL (ref 6–23)
CHLORIDE: 101 meq/L (ref 96–112)
CO2: 25 mEq/L (ref 19–32)
Calcium: 9.5 mg/dL (ref 8.4–10.5)
Creatinine, Ser: 0.85 mg/dL (ref 0.50–1.10)
GFR calc Af Amer: >90 mL/min (ref >90)
GFR calc non Af Amer: 90 mL/min — ABNORMAL LOW (ref >90)
Glucose, Bld: 92 mg/dL (ref 70–99)
Potassium: 3.8 mEq/L (ref 3.7–5.3)
SODIUM: 138 meq/L (ref 137–147)
Total Protein: 7.9 g/dL (ref 6.0–8.3)

## 2014-02-28 NOTE — ED Notes (Signed)
Pt. reports low abdominal pain , low back pain , dysuria and blood tinged urine , denies fever / no nausea or vomitting .

## 2014-03-01 ENCOUNTER — Emergency Department (HOSPITAL_COMMUNITY): Payer: Medicaid Other

## 2014-03-01 ENCOUNTER — Encounter (HOSPITAL_COMMUNITY): Payer: Self-pay | Admitting: Radiology

## 2014-03-01 ENCOUNTER — Emergency Department (HOSPITAL_COMMUNITY)
Admission: EM | Admit: 2014-03-01 | Discharge: 2014-03-01 | Disposition: A | Discharge status: Home / Self Care | Payer: Medicaid Other | Attending: Emergency Medicine | Admitting: Emergency Medicine

## 2014-03-01 DIAGNOSIS — K5790 Diverticulosis of intestine, part unspecified, without perforation or abscess without bleeding: Secondary | ICD-10-CM

## 2014-03-01 DIAGNOSIS — R109 Unspecified abdominal pain: Secondary | ICD-10-CM

## 2014-03-01 DIAGNOSIS — R300 Dysuria: Secondary | ICD-10-CM

## 2014-03-01 LAB — URINALYSIS, ROUTINE W REFLEX MICROSCOPIC
BILIRUBIN URINE: NEGATIVE
GLUCOSE, UA: NEGATIVE mg/dL
KETONES UR: NEGATIVE mg/dL
Nitrite: NEGATIVE
PH: 6 (ref 5.0–8.0)
Protein, ur: NEGATIVE mg/dL
SPECIFIC GRAVITY, URINE: 1.026 (ref 1.005–1.030)
Urobilinogen, UA: 1 mg/dL (ref 0.0–1.0)

## 2014-03-01 LAB — PREGNANCY, URINE: PREG TEST UR: NEGATIVE

## 2014-03-01 LAB — URINE MICROSCOPIC-ADD ON

## 2014-03-01 MED ORDER — CIPROFLOXACIN HCL 500 MG PO TABS: 500.0000 mg | ORAL_TABLET | Freq: Two times a day (BID) | ORAL | Status: DC

## 2014-03-01 MED ORDER — ONDANSETRON HCL 4 MG PO TABS: 4.0000 mg | ORAL_TABLET | Freq: Four times a day (QID) | ORAL | Status: DC | PRN

## 2014-03-01 MED ORDER — ONDANSETRON HCL 4 MG/2ML IJ SOLN
4.0000 mg | Freq: Once | INTRAMUSCULAR | Status: AC
  Administered 2014-03-01: 4 mg via INTRAVENOUS
  Filled 2014-03-01: qty 2

## 2014-03-01 MED ORDER — ONDANSETRON 4 MG PO TBDP
8.0000 mg | ORAL_TABLET | Freq: Once | ORAL | Status: AC
  Administered 2014-03-01: 8 mg via ORAL
  Filled 2014-03-01: qty 2

## 2014-03-01 MED ORDER — OXYCODONE-ACETAMINOPHEN 5-325 MG PO TABS: 1.0000 | ORAL_TABLET | ORAL | Status: DC | PRN

## 2014-03-01 MED ORDER — HYDROMORPHONE HCL PF 1 MG/ML IJ SOLN
1.0000 mg | Freq: Once | INTRAMUSCULAR | Status: AC
  Administered 2014-03-01: 1 mg via INTRAVENOUS
  Filled 2014-03-01: qty 1

## 2014-03-01 MED ORDER — METRONIDAZOLE 500 MG PO TABS: 500.0000 mg | ORAL_TABLET | Freq: Three times a day (TID) | ORAL | Status: DC

## 2014-03-01 MED ORDER — IOHEXOL 300 MG/ML  SOLN
100.0000 mL | Freq: Once | INTRAMUSCULAR | Status: AC | PRN
  Administered 2014-03-01: 100 mL via INTRAVENOUS

## 2014-03-01 MED ORDER — OXYCODONE-ACETAMINOPHEN 5-325 MG PO TABS
1.0000 | ORAL_TABLET | Freq: Once | ORAL | Status: AC
  Administered 2014-03-01: 1 via ORAL
  Filled 2014-03-01: qty 1

## 2014-03-01 MED ORDER — IOHEXOL 300 MG/ML  SOLN
25.0000 mL | INTRAMUSCULAR | Status: AC
  Administered 2014-03-01: 25 mL via ORAL

## 2014-03-01 NOTE — Discharge Instructions (Signed)
Your evaluation did not show a clear cause for her your pain. There is no definite signs of urinary tract infection. CAT scan showed some evidence of diverticulosis but not diverticulitis. I am going to treat you empirically for possible low-grade diverticulitis. Follow up with your physician on June 17 as scheduled. Return to the emergency department if symptoms are getting worse.  Diverticulosis Diverticulosis is a common condition that develops when small pouches (diverticula) form in the wall of the colon. The risk of diverticulosis increases with age. It happens more often in people who eat a low-fiber diet. Most individuals with diverticulosis have no symptoms. Those individuals with symptoms usually experience abdominal pain, constipation, or loose stools (diarrhea). HOME CARE INSTRUCTIONS   Increase the amount of fiber in your diet as directed by your caregiver or dietician. This may reduce symptoms of diverticulosis.  Your caregiver may recommend taking a dietary fiber supplement.  Drink at least 6 to 8 glasses of water each day to prevent constipation.  Try not to strain when you have a bowel movement.  Your caregiver may recommend avoiding nuts and seeds to prevent complications, although this is still an uncertain benefit.  Only take over-the-counter or prescription medicines for pain, discomfort, or fever as directed by your caregiver. FOODS WITH HIGH FIBER CONTENT INCLUDE:  Fruits. Apple, peach, pear, tangerine, raisins, prunes.  Vegetables. Brussels sprouts, asparagus, broccoli, cabbage, carrot, cauliflower, romaine lettuce, spinach, summer squash, tomato, winter squash, zucchini.  Starchy Vegetables. Baked beans, kidney beans, lima beans, split peas, lentils, potatoes (with skin).  Grains. Whole wheat bread, brown rice, bran flake cereal, plain oatmeal, white rice, shredded wheat, bran muffins. SEEK IMMEDIATE MEDICAL CARE IF:   You develop increasing pain or severe  bloating.  You have an oral temperature above 102 F (38.9 C), not controlled by medicine.  You develop vomiting or bowel movements that are bloody or black. Document Released: 06/07/2004 Document Revised: 12/03/2011 Document Reviewed: 02/08/2010 [facility] Patient Information 2014 [place].  Diverticulitis A diverticulum is a small pouch or sac on the colon. Diverticulosis is the presence of these diverticula on the colon. Diverticulitis is the irritation (inflammation) or infection of diverticula. CAUSES  The colon and its diverticula contain bacteria. If food particles block the tiny opening to a diverticulum, the bacteria inside can grow and cause an increase in pressure. This leads to infection and inflammation and is called diverticulitis. SYMPTOMS   Abdominal pain and tenderness. Usually, the pain is located on the left side of your abdomen. However, it could be located elsewhere.  Fever.  Bloating.  Feeling sick to your stomach (nausea).  Throwing up (vomiting).  Abnormal stools. DIAGNOSIS  Your caregiver will take a history and perform a physical exam. Since many things can cause abdominal pain, other tests may be necessary. Tests may include:  Blood tests.  Urine tests.  X-ray of the abdomen.  CT scan of the abdomen. Sometimes, surgery is needed to determine if diverticulitis or other conditions are causing your symptoms. TREATMENT  Most of the time, you can be treated without surgery. Treatment includes:  Resting the bowels by only having liquids for a few days. As you improve, you will need to eat a low-fiber diet.  Intravenous (IV) fluids if you are losing body fluids (dehydrated).  Antibiotic medicines that treat infections may be given.  Pain and nausea medicine, if needed.  Surgery if the inflamed diverticulum has burst. HOME CARE INSTRUCTIONS   Try a clear liquid diet (broth, tea, or  water for as long as directed by your caregiver). You may  then gradually begin a low-fiber diet as tolerated.  A low-fiber diet is a diet with less than 10 grams of fiber. Choose the foods below to reduce fiber in the diet:  White breads, cereals, rice, and pasta.  Cooked fruits and vegetables or soft fresh fruits and vegetables without the skin.  Ground or well-cooked tender beef, ham, veal, lamb, pork, or poultry.  Eggs and seafood.  After your diverticulitis symptoms have improved, your caregiver may put you on a high-fiber diet. A high-fiber diet includes 14 grams of fiber for every 1000 calories consumed. For a standard 2000 calorie diet, you would need 28 grams of fiber. Follow these diet guidelines to help you increase the fiber in your diet. It is important to slowly increase the amount fiber in your diet to avoid gas, constipation, and bloating.  Choose whole-grain breads, cereals, pasta, and brown rice.  Choose fresh fruits and vegetables with the skin on. Do not overcook vegetables because the more vegetables are cooked, the more fiber is lost.  Choose more nuts, seeds, legumes, dried peas, beans, and lentils.  Look for food products that have greater than 3 grams of fiber per serving on the Nutrition Facts label.  Take all medicine as directed by your caregiver.  If your caregiver has given you a follow-up appointment, it is very important that you go. Not going could result in lasting (chronic) or permanent injury, pain, and disability. If there is any problem keeping the appointment, call to reschedule. SEEK MEDICAL CARE IF:   Your pain does not improve.  You have a hard time advancing your diet beyond clear liquids.  Your bowel movements do not return to normal. SEEK IMMEDIATE MEDICAL CARE IF:   Your pain becomes worse.  You have an oral temperature above 102 F (38.9 C), not controlled by medicine.  You have repeated vomiting.  You have bloody or black, tarry stools.  Symptoms that brought you to your caregiver  become worse or are not getting better. MAKE SURE YOU:   Understand these instructions.  Will watch your condition.  Will get help right away if you are not doing well or get worse. Document Released: 06/20/2005 Document Revised: 12/03/2011 Document Reviewed: 10/16/2010 [facility] Patient Information 2014 [place].  Ciprofloxacin tablets What is this medicine? CIPROFLOXACIN (sip roe FLOX a sin) is a quinolone antibiotic. It is used to treat certain kinds of bacterial infections. It will not work for colds, flu, or other viral infections. This medicine may be used for other purposes; ask your health care provider or pharmacist if you have questions. COMMON BRAND NAME(S): Cipro What should I tell my health care provider before I take this medicine? They need to know if you have any of these conditions: -bone problems -cerebral disease -joint problems -irregular heartbeat -kidney disease -liver disease -myasthenia gravis -seizure disorder -tendon problems -an unusual or allergic reaction to ciprofloxacin, other antibiotics or medicines, foods, dyes, or preservatives -pregnant or trying to get pregnant -breast-feeding How should I use this medicine? Take this medicine by mouth with a glass of water. Follow the directions on the prescription label. Take your medicine at regular intervals. Do not take your medicine more often than directed. Take all of your medicine as directed even if you think your are better. Do not skip doses or stop your medicine early. You can take this medicine with food or on an empty stomach. It can be  taken with a meal that contains dairy or calcium, but do not take it alone with a dairy product, like milk or yogurt or calcium-fortified juice. A special MedGuide will be given to you by the pharmacist with each prescription and refill. Be sure to read this information carefully each time. Talk to your pediatrician regarding the use of this medicine in  children. Special care may be needed. Overdosage: If you think you have taken too much of this medicine contact a poison control center or emergency room at once. NOTE: This medicine is only for you. Do not share this medicine with others. What if I miss a dose? If you miss a dose, take it as soon as you can. If it is almost time for your next dose, take only that dose. Do not take double or extra doses. What may interact with this medicine? Do not take this medicine with any of the following medications: -cisapride -droperidol -terfenadine -tizanidine This medicine may also interact with the following medications: -antacids -caffeine -cyclosporin -didanosine (ddI) buffered tablets or powder -medicines for diabetes -medicines for inflammation like ibuprofen, naproxen -methotrexate -multivitamins -omeprazole -phenytoin -probenecid -sucralfate -theophylline -warfarin This list may not describe all possible interactions. Give your health care provider a list of all the medicines, herbs, non-prescription drugs, or dietary supplements you use. Also tell them if you smoke, drink alcohol, or use illegal drugs. Some items may interact with your medicine. What should I watch for while using this medicine? Tell your doctor or health care professional if your symptoms do not improve. Do not treat diarrhea with over the counter products. Contact your doctor if you have diarrhea that lasts more than 2 days or if it is severe and watery. You may get drowsy or dizzy. Do not drive, use machinery, or do anything that needs mental alertness until you know how this medicine affects you. Do not stand or sit up quickly, especially if you are an older patient. This reduces the risk of dizzy or fainting spells. This medicine can make you more sensitive to the sun. Keep out of the sun. If you cannot avoid being in the sun, wear protective clothing and use sunscreen. Do not use sun lamps or tanning  beds/booths. Avoid antacids, aluminum, calcium, iron, magnesium, and zinc products for 6 hours before and 2 hours after taking a dose of this medicine. What side effects may I notice from receiving this medicine? Side effects that you should report to your doctor or health care professional as soon as possible: - allergic reactions like skin rash, itching or hives, swelling of the face, lips, or tongue - breathing problems - confusion, nightmares or hallucinations - feeling faint or lightheaded, falls - irregular heartbeat - joint, muscle or tendon pain or swelling - pain or trouble passing urine -persistent headache with or without blurred vision - redness, blistering, peeling or loosening of the skin, including inside the mouth - seizure - unusual pain, numbness, tingling, or weakness Side effects that usually do not require medical attention (report to your doctor or health care professional if they continue or are bothersome): - diarrhea - nausea or stomach upset - white patches or sores in the mouth This list may not describe all possible side effects. Call your doctor for medical advice about side effects. You may report side effects to FDA at 1-800-FDA-1088. Where should I keep my medicine? Keep out of the reach of children. Store at room temperature below 30 degrees C (86 degrees F). Keep container  tightly closed. Throw away any unused medicine after the expiration date. NOTE: This sheet is a summary. It may not cover all possible information. If you have questions about this medicine, talk to your doctor, pharmacist, or health care provider.  2014, [employer] Standard. (2011-09-28 12:53:06)  Metronidazole tablets or capsules What is this medicine? METRONIDAZOLE (me troe NI da zole) is an antiinfective. It is used to treat certain kinds of bacterial and protozoal infections. It will not work for colds, flu, or other viral infections. This medicine may be used for  other purposes; ask your health care provider or pharmacist if you have questions. COMMON BRAND NAME(S): Flagyl What should I tell my health care provider before I take this medicine? They need to know if you have any of these conditions: -anemia or other blood disorders -disease of the nervous system -fungal or yeast infection -if you drink alcohol containing drinks -liver disease -seizures -an unusual or allergic reaction to metronidazole, or other medicines, foods, dyes, or preservatives -pregnant or trying to get pregnant -breast-feeding How should I use this medicine? Take this medicine by mouth with a full glass of water. Follow the directions on the prescription label. Take your medicine at regular intervals. Do not take your medicine more often than directed. Take all of your medicine as directed even if you think you are better. Do not skip doses or stop your medicine early. Talk to your pediatrician regarding the use of this medicine in children. Special care may be needed. Overdosage: If you think you have taken too much of this medicine contact a poison control center or emergency room at once. NOTE: This medicine is only for you. Do not share this medicine with others. What if I miss a dose? If you miss a dose, take it as soon as you can. If it is almost time for your next dose, take only that dose. Do not take double or extra doses. What may interact with this medicine? Do not take this medicine with any of the following medications: -alcohol or any product that contains alcohol -amprenavir oral solution -cisapride -disulfiram -dofetilide -dronedarone -paclitaxel injection -pimozide -ritonavir oral solution -sertraline oral solution -sulfamethoxazole-trimethoprim injection -thioridazine -ziprasidone This medicine may also interact with the following medications: -cimetidine -lithium -other medicines that prolong the QT interval (cause an abnormal heart  rhythm) -phenobarbital -phenytoin -warfarin This list may not describe all possible interactions. Give your health care provider a list of all the medicines, herbs, non-prescription drugs, or dietary supplements you use. Also tell them if you smoke, drink alcohol, or use illegal drugs. Some items may interact with your medicine. What should I watch for while using this medicine? Tell your doctor or health care professional if your symptoms do not improve or if they get worse. You may get drowsy or dizzy. Do not drive, use machinery, or do anything that needs mental alertness until you know how this medicine affects you. Do not stand or sit up quickly, especially if you are an older patient. This reduces the risk of dizzy or fainting spells. Avoid alcoholic drinks while you are taking this medicine and for three days afterward. Alcohol may make you feel dizzy, sick, or flushed. If you are being treated for a sexually transmitted disease, avoid sexual contact until you have finished your treatment. Your sexual partner may also need treatment. What side effects may I notice from receiving this medicine? Side effects that you should report to your doctor or health care professional as soon as  possible: -allergic reactions like skin rash or hives, swelling of the face, lips, or tongue -confusion, clumsiness -difficulty speaking -discolored or sore mouth -dizziness -fever, infection -numbness, tingling, pain or weakness in the hands or feet -trouble passing urine or change in the amount of urine -redness, blistering, peeling or loosening of the skin, including inside the mouth -seizures -unusually weak or tired -vaginal irritation, dryness, or discharge Side effects that usually do not require medical attention (report to your doctor or health care professional if they continue or are bothersome): -diarrhea -headache -irritability -metallic taste -nausea -stomach pain or cramps -trouble  sleeping This list may not describe all possible side effects. Call your doctor for medical advice about side effects. You may report side effects to FDA at 1-800-FDA-1088. Where should I keep my medicine? Keep out of the reach of children. Store at room temperature below 25 degrees C (77 degrees F). Protect from light. Keep container tightly closed. Throw away any unused medicine after the expiration date. NOTE: This sheet is a summary. It may not cover all possible information. If you have questions about this medicine, talk to your doctor, pharmacist, or health care provider.  2014, [employer] Standard. (2013-03-10 14:08:26)  Ondansetron tablets What is this medicine? ONDANSETRON (on DAN se tron) is used to treat nausea and vomiting caused by chemotherapy. It is also used to prevent or treat nausea and vomiting after surgery. This medicine may be used for other purposes; ask your health care provider or pharmacist if you have questions. COMMON BRAND NAME(S): Zofran What should I tell my health care provider before I take this medicine? They need to know if you have any of these conditions: -heart disease -history of irregular heartbeat -liver disease -low levels of magnesium or potassium in the blood -an unusual or allergic reaction to ondansetron, granisetron, other medicines, foods, dyes, or preservatives -pregnant or trying to get pregnant -breast-feeding How should I use this medicine? Take this medicine by mouth with a glass of water. Follow the directions on your prescription label. Take your doses at regular intervals. Do not take your medicine more often than directed. Talk to your pediatrician regarding the use of this medicine in children. Special care may be needed. Overdosage: If you think you have taken too much of this medicine contact a poison control center or emergency room at once. NOTE: This medicine is only for you. Do not share this medicine with others. What if  I miss a dose? If you miss a dose, take it as soon as you can. If it is almost time for your next dose, take only that dose. Do not take double or extra doses. What may interact with this medicine? Do not take this medicine with any of the following medications: -apomorphine -certain medicines for fungal infections like fluconazole, itraconazole, ketoconazole, posaconazole, voriconazole -cisapride -dofetilide -dronedarone -pimozide -thioridazine -ziprasidone  This medicine may also interact with the following medications: -carbamazepine -certain medicines for depression, anxiety, or psychotic disturbances -fentanyl -linezolid -MAOIs like Carbex, Eldepryl, Marplan, Nardil, and Parnate -methylene blue (injected into a vein) -other medicines that prolong the QT interval (cause an abnormal heart rhythm) -phenytoin -rifampicin -tramadol This list may not describe all possible interactions. Give your health care provider a list of all the medicines, herbs, non-prescription drugs, or dietary supplements you use. Also tell them if you smoke, drink alcohol, or use illegal drugs. Some items may interact with your medicine. What should I watch for while using this medicine? Check with your doctor  or health care professional right away if you have any sign of an allergic reaction. What side effects may I notice from receiving this medicine? Side effects that you should report to your doctor or health care professional as soon as possible: -allergic reactions like skin rash, itching or hives, swelling of the face, lips or tongue -breathing problems -confusion -dizziness -fast or irregular heartbeat -feeling faint or lightheaded, falls -fever and chills -loss of balance or coordination -seizures -sweating -swelling of the hands or feet -tightness in the chest -tremors -unusually weak or tired Side effects that usually do not require medical attention (report to your doctor or health care  professional if they continue or are bothersome): -constipation or diarrhea -headache This list may not describe all possible side effects. Call your doctor for medical advice about side effects. You may report side effects to FDA at 1-800-FDA-1088. Where should I keep my medicine? Keep out of the reach of children. Store between 2 and 30 degrees C (36 and 86 degrees F). Throw away any unused medicine after the expiration date. NOTE: This sheet is a summary. It may not cover all possible information. If you have questions about this medicine, talk to your doctor, pharmacist, or health care provider.  2014, [employer] Standard. (2013-06-17 16:27:45)  Acetaminophen; Oxycodone tablets What is this medicine? ACETAMINOPHEN; OXYCODONE (a set a MEE noe fen; ox i KOE done) is a pain reliever. It is used to treat mild to moderate pain. This medicine may be used for other purposes; ask your health care provider or pharmacist if you have questions. COMMON BRAND NAME(S): Endocet, Magnacet, Narvox, Percocet, Perloxx, Primalev, Primlev, Roxicet, Xolox What should I tell my health care provider before I take this medicine? They need to know if you have any of these conditions: -brain tumor -Crohn's disease, inflammatory bowel disease, or ulcerative colitis -drug abuse or addiction -head injury -heart or circulation problems -if you often drink alcohol -kidney disease or problems going to the bathroom -liver disease -lung disease, asthma, or breathing problems -an unusual or allergic reaction to acetaminophen, oxycodone, other opioid analgesics, other medicines, foods, dyes, or preservatives -pregnant or trying to get pregnant -breast-feeding How should I use this medicine? Take this medicine by mouth with a full glass of water. Follow the directions on the prescription label. Take your medicine at regular intervals. Do not take your medicine more often than directed. Talk to your pediatrician  regarding the use of this medicine in children. Special care may be needed. Patients over 28 years old may have a stronger reaction and need a smaller dose. Overdosage: If you think you have taken too much of this medicine contact a poison control center or emergency room at once. NOTE: This medicine is only for you. Do not share this medicine with others. What if I miss a dose? If you miss a dose, take it as soon as you can. If it is almost time for your next dose, take only that dose. Do not take double or extra doses. What may interact with this medicine? -alcohol -antihistamines -barbiturates like amobarbital, butalbital, butabarbital, methohexital, pentobarbital, phenobarbital, thiopental, and secobarbital -benztropine -drugs for bladder problems like solifenacin, trospium, oxybutynin, tolterodine, hyoscyamine, and methscopolamine -drugs for breathing problems like ipratropium and tiotropium -drugs for certain stomach or intestine problems like propantheline, homatropine methylbromide, glycopyrrolate, atropine, belladonna, and dicyclomine -general anesthetics like etomidate, ketamine, nitrous oxide, propofol, desflurane, enflurane, halothane, isoflurane, and sevoflurane -medicines for depression, anxiety, or psychotic disturbances -medicines for sleep -muscle relaxants -  naltrexone -narcotic medicines (opiates) for pain -phenothiazines like perphenazine, thioridazine, chlorpromazine, mesoridazine, fluphenazine, prochlorperazine, promazine, and trifluoperazine -scopolamine -tramadol -trihexyphenidyl This list may not describe all possible interactions. Give your health care provider a list of all the medicines, herbs, non-prescription drugs, or dietary supplements you use. Also tell them if you smoke, drink alcohol, or use illegal drugs. Some items may interact with your medicine. What should I watch for while using this medicine? Tell your doctor or health care professional if your pain  does not go away, if it gets worse, or if you have new or a different type of pain. You may develop tolerance to the medicine. Tolerance means that you will need a higher dose of the medication for pain relief. Tolerance is normal and is expected if you take this medicine for a long time. Do not suddenly stop taking your medicine because you may develop a severe reaction. Your body becomes used to the medicine. This does NOT mean you are addicted. Addiction is a behavior related to getting and using a drug for a non-medical reason. If you have pain, you have a medical reason to take pain medicine. Your doctor will tell you how much medicine to take. If your doctor wants you to stop the medicine, the dose will be slowly lowered over time to avoid any side effects. You may get drowsy or dizzy. Do not drive, use machinery, or do anything that needs mental alertness until you know how this medicine affects you. Do not stand or sit up quickly, especially if you are an older patient. This reduces the risk of dizzy or fainting spells. Alcohol may interfere with the effect of this medicine. Avoid alcoholic drinks. There are different types of narcotic medicines (opiates) for pain. If you take more than one type at the same time, you may have more side effects. Give your health care provider a list of all medicines you use. Your doctor will tell you how much medicine to take. Do not take more medicine than directed. Call emergency for help if you have problems breathing. The medicine will cause constipation. Try to have a bowel movement at least every 2 to 3 days. If you do not have a bowel movement for 3 days, call your doctor or health care professional. Do not take Tylenol (acetaminophen) or medicines that have acetaminophen with this medicine. Too much acetaminophen can be very dangerous. Many nonprescription medicines contain acetaminophen. Always read the labels carefully to avoid taking more acetaminophen. What  side effects may I notice from receiving this medicine? Side effects that you should report to your doctor or health care professional as soon as possible: -allergic reactions like skin rash, itching or hives, swelling of the face, lips, or tongue -breathing difficulties, wheezing -confusion -light headedness or fainting spells -severe stomach pain -unusually weak or tired -yellowing of the skin or the whites of the eyes  Side effects that usually do not require medical attention (report to your doctor or health care professional if they continue or are bothersome): -dizziness -drowsiness -nausea -vomiting This list may not describe all possible side effects. Call your doctor for medical advice about side effects. You may report side effects to FDA at 1-800-FDA-1088. Where should I keep my medicine? Keep out of the reach of children. This medicine can be abused. Keep your medicine in a safe place to protect it from theft. Do not share this medicine with anyone. Selling or giving away this medicine is dangerous and against the law.  Store at room temperature between 20 and 25 degrees C (68 and 77 degrees F). Keep container tightly closed. Protect from light. This medicine may cause accidental overdose and death if it is taken by other adults, children, or pets. Flush any unused medicine down the toilet to reduce the chance of harm. Do not use the medicine after the expiration date. NOTE: This sheet is a summary. It may not cover all possible information. If you have questions about this medicine, talk to your doctor, pharmacist, or health care provider.  2014, Elsevier/Gold Standard. (2013-05-04 13:17:35)

## 2014-03-01 NOTE — ED Notes (Signed)
Pt A&OX4 ambulatory at d/c with slow steady gait, NAD

## 2014-03-01 NOTE — ED Provider Notes (Signed)
CSN: [id number]     Arrival date & time 02/28/14  2239 History   First MD Initiated Contact with Patient 03/01/14 [id number]     Chief Complaint  Patient presents with  . Abdominal Pain     (Consider location/radiation/quality/duration/timing/severity/associated sxs/prior Treatment) Patient is a 33 y.o. female presenting with abdominal pain. The history is provided by the patient.  Abdominal Pain She complains of severe left lower quadrant pain radiating to the left flank. Pain has been present for the last 3 days. The distal and crampy with occasional sharp episodes. It is worse when she urinates. There is associated nausea but no vomiting. She denies fever or chills or sweats. She has noted some urinary frequency and pressure when she urinates and has noted some blood in her urine. Pain is severe and she rates at 10/10. Symptoms seem to be getting worse. She had been seen in the emergency department about 6 weeks ago with a similar presentation and had a urinary infection. She denies prior problems with urinary tract infections.  Past Medical History  Diagnosis Date  . Pseudotumor cerebri   . Migraine    History reviewed. No pertinent past surgical history. No family history on file. History  Substance Use Topics  . Smoking status: Never Smoker   . Smokeless tobacco: Not on file  . Alcohol Use: Yes     Comment: occ   OB History   Grav Para Term Preterm Abortions TAB SAB Ect Mult Living                 Review of Systems  Gastrointestinal: Positive for abdominal pain.  All other systems reviewed and are negative.     Allergies  Review of patient's allergies indicates no known allergies.  Home Medications   Prior to Admission medications   Medication Sig Start Date End Date Taking? Authorizing Provider  acetaminophen (TYLENOL) 325 MG tablet Take 650 mg by mouth every 6 (six) hours as needed for moderate pain.   Yes Historical Provider, MD   BP 109/55  Pulse 86  Temp(Src)  97.7 F (36.5 C) (Oral)  Resp 18  Ht 5\' 6"  (1.676 m)  Wt 297 lb (134.718 kg)  BMI 47.96 kg/m2  SpO2 97%  LMP 01/26/2014 Physical Exam  Nursing note and vitals reviewed.  33 year old female, resting comfortably and in no acute distress. Vital signs are normal. Oxygen saturation is 100%, which is normal. Head is normocephalic and atraumatic. PERRLA, EOMI. Oropharynx is clear. Neck is nontender and supple without adenopathy or JVD. Back is nontender in the midline. There is moderate left CVA tenderness. Lungs are clear without rales, wheezes, or rhonchi. Chest is nontender. Heart has regular rate and rhythm without murmur. Abdomen is soft, flat, with mild to moderate left lower quadrant tenderness. There is no rebound or guarding. There are no masses or hepatosplenomegaly and peristalsis is slightly hypoactive. Extremities have no cyanosis or edema, full range of motion is present. Skin is warm and dry without rash. Neurologic: Mental status is normal, cranial nerves are intact, there are no motor or sensory deficits.  ED Course  Procedures (including critical care time) Labs Review Results for orders placed during the hospital encounter of 03/01/14  CBC WITH DIFFERENTIAL      Result Value Ref Range   WBC 10.6 (*) 4.0 - 10.5 K/uL   RBC 4.30  3.87 - 5.11 MIL/uL   Hemoglobin 13.0  12.0 - 15.0 g/dL   HCT 38.6  36.0 -  46.0 %   MCV 89.8  78.0 - 100.0 fL   MCH 30.2  26.0 - 34.0 pg   MCHC 33.7  30.0 - 36.0 g/dL   RDW 12.2  11.5 - 15.5 %   Platelets 208  150 - 400 K/uL   Neutrophils Relative % 55  43 - 77 %   Lymphocytes Relative 35  12 - 46 %   Monocytes Relative 9  3 - 12 %   Eosinophils Relative 1  0 - 5 %   Basophils Relative 0  0 - 1 %   Neutro Abs 5.8  1.7 - 7.7 K/uL   Lymphs Abs 3.7  0.7 - 4.0 K/uL   Monocytes Absolute 1.0  0.1 - 1.0 K/uL   Eosinophils Absolute 0.1  0.0 - 0.7 K/uL   Basophils Absolute 0.0  0.0 - 0.1 K/uL   Smear Review MORPHOLOGY UNREMARKABLE     COMPREHENSIVE METABOLIC PANEL      Result Value Ref Range   Sodium 138  137 - 147 mEq/L   Potassium 3.8  3.7 - 5.3 mEq/L   Chloride 101  96 - 112 mEq/L   CO2 25  19 - 32 mEq/L   Glucose, Bld 92  70 - 99 mg/dL   BUN 14  6 - 23 mg/dL   Creatinine, Ser 0.85  0.50 - 1.10 mg/dL   Calcium 9.5  8.4 - 10.5 mg/dL   Total Protein 7.9  6.0 - 8.3 g/dL   Albumin 3.9  3.5 - 5.2 g/dL   AST 31  0 - 37 U/L   ALT 52 (*) 0 - 35 U/L   Alkaline Phosphatase 87  39 - 117 U/L   Total Bilirubin 0.4  0.3 - 1.2 mg/dL   GFR calc non Af Amer 90 (*) >90 mL/min   GFR calc Af Amer >90  >90 mL/min  PREGNANCY, URINE      Result Value Ref Range   Preg Test, Ur NEGATIVE  NEGATIVE  URINALYSIS, ROUTINE W REFLEX MICROSCOPIC      Result Value Ref Range   Color, Urine YELLOW  YELLOW   APPearance CLOUDY (*) CLEAR   Specific Gravity, Urine 1.026  1.005 - 1.030   pH 6.0  5.0 - 8.0   Glucose, UA NEGATIVE  NEGATIVE mg/dL   Hgb urine dipstick TRACE (*) NEGATIVE   Bilirubin Urine NEGATIVE  NEGATIVE   Ketones, ur NEGATIVE  NEGATIVE mg/dL   Protein, ur NEGATIVE  NEGATIVE mg/dL   Urobilinogen, UA 1.0  0.0 - 1.0 mg/dL   Nitrite NEGATIVE  NEGATIVE   Leukocytes, UA SMALL (*) NEGATIVE  URINE MICROSCOPIC-ADD ON      Result Value Ref Range   Squamous Epithelial / LPF MANY (*) RARE   WBC, UA 3-6  <3 WBC/hpf   RBC / HPF 3-6  <3 RBC/hpf   Imaging Review Ct Abdomen Pelvis W Contrast  03/01/2014   CLINICAL DATA:  Lower abdominal pain, dysuria, hematuria, nausea vomiting for 2-3 days.  EXAM: CT ABDOMEN AND PELVIS WITH CONTRAST  TECHNIQUE: Multidetector CT imaging of the abdomen and pelvis was performed using the standard protocol following bolus administration of intravenous contrast.  CONTRAST:  154mL OMNIPAQUE IOHEXOL 300 MG/ML  SOLN  COMPARISON:  CT of the abdomen and pelvis January 14, 2014.  FINDINGS: Included view of the lung bases are clear. Visualized heart and pericardium are unremarkable.  The spleen, gallbladder, pancreas and  adrenal glands are unremarkable. The liver is diffusely hypodense, most  consistent with fatty infiltration with mild focal fatty sparing about the gallbladder fossa.  The stomach, small and large bowel are normal in course and caliber without inflammatory changes. Enteric contrast has not yet reached the distal small bowel. Mild small bowel feces. The appendix is not discretely identified, however there are no inflammatory changes in the right lower quadrant. A few scattered colonic diverticula. No intraperitoneal free fluid nor free air.  Kidneys are orthotopic, demonstrating symmetric enhancement without nephrolithiasis, hydronephrosis or renal masses. The unopacified ureters are normal in course and caliber. Urinary bladder is partially distended and unremarkable. Punctate phleboliths in the pelvis.  Aortoiliac vessels are normal in course and caliber. No lymphadenopathy by CT size criteria. Internal reproductive organs are normal. The soft tissues and included osseous structures are nonsuspicious. Scattered Schmorl's nodes.  IMPRESSION: No acute intra-abdominal or pelvic process.  Mild colonic diverticulosis. Mild small bowel feces may reflect chronic stasis without bowel obstruction.  Hepatic steatosis.   Electronically Signed   By: Elon Alas   On: 03/01/2014 03:56   MDM   Final diagnoses:  Flank pain  Dysuria  Diverticulosis    Left lower quadrant pain with radiation to the flank concerning for possible pyelonephritis. Old records are reviewed and she was seen in the ED on April 21 and diagnosed with pyelonephritis. Urine culture showed Escherichia coli which was universally sensitive. Initial laboratory workup is unremarkable and urinalysis is pending at this time.  Urinalysis does not show evidence of UTI. She will be sent for CT scan.  CT shows no evidence of hydronephrosis or ureterolithiasis but diverticulosis is present. It is possible her pain could be due to mild diverticulitis  which is not evident on CT scan. She will be empirically treated with a course of ciprofloxacin and metronidazole. She has an appointment with her new gynecologist in one week had an evaluation for dysuria can be done at that time. She is discharged with prescriptions for ciprofloxacin, metronidazole, ondansetron, and oxycodone-acetaminophen.  Delora Fuel, MD 78/58/85 [id number]

## 2014-03-01 NOTE — ED Notes (Signed)
Pt finished with oral CT contrast. CT made aware.

## 2014-03-11 ENCOUNTER — Emergency Department: Payer: Self-pay | Admitting: Emergency Medicine

## 2014-03-12 LAB — URINALYSIS, COMPLETE
BILIRUBIN, UR: NEGATIVE
Bacteria: NONE SEEN
GLUCOSE, UR: NEGATIVE mg/dL (ref 0–75)
Ketone: NEGATIVE
Nitrite: NEGATIVE
PROTEIN: NEGATIVE
Ph: 6 (ref 4.5–8.0)
RBC,UR: 1 /HPF (ref 0–5)
SPECIFIC GRAVITY: 1.018 (ref 1.003–1.030)
Squamous Epithelial: <1

## 2014-04-18 ENCOUNTER — Emergency Department: Payer: Self-pay | Admitting: Emergency Medicine

## 2014-04-19 LAB — BASIC METABOLIC PANEL
ANION GAP: 8 (ref 7–16)
BUN: 13 mg/dL (ref 7–18)
CALCIUM: 8.3 mg/dL — AB (ref 8.5–10.1)
CHLORIDE: 108 mmol/L — AB (ref 98–107)
CREATININE: 0.73 mg/dL (ref 0.60–1.30)
Co2: 23 mmol/L (ref 21–32)
EGFR (African American): >60
Glucose: 80 mg/dL (ref 65–99)
OSMOLALITY: 277 (ref 275–301)
POTASSIUM: 3.6 mmol/L (ref 3.5–5.1)
Sodium: 139 mmol/L (ref 136–145)

## 2014-04-19 LAB — URINALYSIS, COMPLETE
Bilirubin,UR: NEGATIVE
Blood: NEGATIVE
GLUCOSE, UR: NEGATIVE mg/dL (ref 0–75)
KETONE: NEGATIVE
Nitrite: NEGATIVE
PH: 5 (ref 4.5–8.0)
Specific Gravity: 1.035 (ref 1.003–1.030)
Squamous Epithelial: 11

## 2014-04-19 LAB — CBC
HCT: 37.1 % (ref 35.0–47.0)
HGB: 12.3 g/dL (ref 12.0–16.0)
MCH: 30 pg (ref 26.0–34.0)
MCHC: 33.1 g/dL (ref 32.0–36.0)
MCV: 91 fL (ref 80–100)
Platelet: 177 10*3/uL (ref 150–440)
RBC: 4.1 10*6/uL (ref 3.80–5.20)
RDW: 12.5 % (ref 11.5–14.5)
WBC: 10.7 10*3/uL (ref 3.6–11.0)

## 2014-04-19 LAB — PROTIME-INR
INR: 1.1
Prothrombin Time: 13.7 secs (ref 11.5–14.7)

## 2014-04-20 ENCOUNTER — Emergency Department: Payer: Self-pay | Admitting: Emergency Medicine

## 2014-04-27 ENCOUNTER — Ambulatory Visit: Payer: Self-pay | Admitting: Family Medicine

## 2014-05-02 ENCOUNTER — Emergency Department: Payer: Self-pay | Admitting: Emergency Medicine

## 2014-05-12 ENCOUNTER — Emergency Department: Payer: Self-pay | Admitting: Emergency Medicine

## 2014-05-12 LAB — URINALYSIS, COMPLETE
BLOOD: NEGATIVE
Bacteria: NONE SEEN
Bilirubin,UR: NEGATIVE
Glucose,UR: NEGATIVE mg/dL (ref 0–75)
Ketone: NEGATIVE
Nitrite: NEGATIVE
PROTEIN: NEGATIVE
Ph: 5 (ref 4.5–8.0)
RBC,UR: 2 /HPF (ref 0–5)
SPECIFIC GRAVITY: 1.021 (ref 1.003–1.030)
WBC UR: 6 /HPF (ref 0–5)

## 2014-05-12 LAB — CBC
HCT: 39.5 % (ref 35.0–47.0)
HGB: 13.3 g/dL (ref 12.0–16.0)
MCH: 30.7 pg (ref 26.0–34.0)
MCHC: 33.8 g/dL (ref 32.0–36.0)
MCV: 91 fL (ref 80–100)
Platelet: 202 10*3/uL (ref 150–440)
RBC: 4.34 10*6/uL (ref 3.80–5.20)
RDW: 12.9 % (ref 11.5–14.5)
WBC: 11.1 10*3/uL — AB (ref 3.6–11.0)

## 2014-05-12 LAB — HCG, QUANTITATIVE, PREGNANCY: Beta Hcg, Quant.: 18675 m[IU]/mL — ABNORMAL HIGH

## 2014-06-16 ENCOUNTER — Emergency Department: Payer: Self-pay | Admitting: Emergency Medicine

## 2014-06-17 LAB — URINALYSIS, COMPLETE
BACTERIA: NONE SEEN
Bilirubin,UR: NEGATIVE
Blood: NEGATIVE
Glucose,UR: NEGATIVE mg/dL (ref 0–75)
KETONE: NEGATIVE
NITRITE: NEGATIVE
Ph: 6 (ref 4.5–8.0)
Protein: NEGATIVE
SPECIFIC GRAVITY: 1.017 (ref 1.003–1.030)
Squamous Epithelial: 6
WBC UR: 54 /HPF (ref 0–5)

## 2014-06-17 LAB — COMPREHENSIVE METABOLIC PANEL
ALK PHOS: 68 U/L
ALT: 57 U/L
Albumin: 3.2 g/dL — ABNORMAL LOW (ref 3.4–5.0)
Anion Gap: 9 (ref 7–16)
BUN: 8 mg/dL (ref 7–18)
Bilirubin,Total: 0.2 mg/dL (ref 0.2–1.0)
Calcium, Total: 8.6 mg/dL (ref 8.5–10.1)
Chloride: 108 mmol/L — ABNORMAL HIGH (ref 98–107)
Co2: 22 mmol/L (ref 21–32)
Creatinine: 0.8 mg/dL (ref 0.60–1.30)
EGFR (African American): >60
EGFR (Non-African Amer.): >60
Glucose: 106 mg/dL — ABNORMAL HIGH (ref 65–99)
OSMOLALITY: 276 (ref 275–301)
Potassium: 3.7 mmol/L (ref 3.5–5.1)
SGOT(AST): 39 U/L — ABNORMAL HIGH (ref 15–37)
Sodium: 139 mmol/L (ref 136–145)
Total Protein: 7.3 g/dL (ref 6.4–8.2)

## 2014-06-17 LAB — CBC
HCT: 38.5 % (ref 35.0–47.0)
HGB: 13.3 g/dL (ref 12.0–16.0)
MCH: 31 pg (ref 26.0–34.0)
MCHC: 34.5 g/dL (ref 32.0–36.0)
MCV: 90 fL (ref 80–100)
PLATELETS: 191 10*3/uL (ref 150–440)
RBC: 4.3 10*6/uL (ref 3.80–5.20)
RDW: 12.4 % (ref 11.5–14.5)
WBC: 9.8 10*3/uL (ref 3.6–11.0)

## 2014-06-17 LAB — GC/CHLAMYDIA PROBE AMP

## 2014-06-17 LAB — WET PREP, GENITAL

## 2014-06-17 LAB — HCG, QUANTITATIVE, PREGNANCY: Beta Hcg, Quant.: 24120 m[IU]/mL — ABNORMAL HIGH

## 2014-09-01 ENCOUNTER — Observation Stay: Payer: Self-pay | Admitting: Obstetrics and Gynecology

## 2014-09-01 LAB — URINALYSIS, COMPLETE
BACTERIA: NONE SEEN
Bilirubin,UR: NEGATIVE
Blood: NEGATIVE
GLUCOSE, UR: NEGATIVE mg/dL (ref 0–75)
Ketone: NEGATIVE
Nitrite: NEGATIVE
Ph: 5 (ref 4.5–8.0)
Protein: 30
Specific Gravity: 1.029 (ref 1.003–1.030)
Squamous Epithelial: 12

## 2014-09-01 LAB — BASIC METABOLIC PANEL
ANION GAP: 10 (ref 7–16)
BUN: 10 mg/dL (ref 7–18)
Calcium, Total: 8.9 mg/dL (ref 8.5–10.1)
Chloride: 108 mmol/L — ABNORMAL HIGH (ref 98–107)
Co2: 21 mmol/L (ref 21–32)
Creatinine: 0.57 mg/dL — ABNORMAL LOW (ref 0.60–1.30)
EGFR (Non-African Amer.): >60
GLUCOSE: 80 mg/dL (ref 65–99)
OSMOLALITY: 276 (ref 275–301)
Potassium: 3.7 mmol/L (ref 3.5–5.1)
Sodium: 139 mmol/L (ref 136–145)

## 2014-09-20 ENCOUNTER — Encounter: Payer: Self-pay | Admitting: *Deleted

## 2014-09-21 ENCOUNTER — Emergency Department: Payer: Self-pay | Admitting: Emergency Medicine

## 2014-10-05 ENCOUNTER — Ambulatory Visit: Payer: Self-pay | Admitting: General Surgery

## 2014-10-08 ENCOUNTER — Ambulatory Visit: Payer: Self-pay | Admitting: Specialist

## 2014-10-09 ENCOUNTER — Observation Stay: Payer: Self-pay | Admitting: Obstetrics and Gynecology

## 2014-10-14 NOTE — Telephone Encounter (Signed)
error 

## 2014-10-25 HISTORY — PX: OTHER SURGICAL HISTORY: SHX169

## 2014-11-04 ENCOUNTER — Observation Stay: Payer: Self-pay | Admitting: Obstetrics and Gynecology

## 2014-12-04 ENCOUNTER — Observation Stay: Payer: Self-pay | Admitting: Obstetrics and Gynecology

## 2014-12-21 ENCOUNTER — Inpatient Hospital Stay
Admit: 2014-12-21 | Disposition: A | Discharge status: Home / Self Care | Payer: Self-pay | Attending: Obstetrics & Gynecology | Admitting: Obstetrics & Gynecology

## 2014-12-22 LAB — CBC WITH DIFFERENTIAL/PLATELET
Basophil #: 0 10*3/uL (ref 0.0–0.1)
Basophil %: 0.2 %
Eosinophil #: 0.1 10*3/uL (ref 0.0–0.7)
Eosinophil %: 0.9 %
HCT: 32.8 % — AB (ref 35.0–47.0)
HGB: 11 g/dL — ABNORMAL LOW (ref 12.0–16.0)
LYMPHS PCT: 21.1 %
Lymphocyte #: 2.2 10*3/uL (ref 1.0–3.6)
MCH: 29.3 pg (ref 26.0–34.0)
MCHC: 33.4 g/dL (ref 32.0–36.0)
MCV: 88 fL (ref 80–100)
MONOS PCT: 7.2 %
Monocyte #: 0.7 x10 3/mm (ref 0.2–0.9)
NEUTROS ABS: 7.3 10*3/uL — AB (ref 1.4–6.5)
Neutrophil %: 70.6 %
PLATELETS: 216 10*3/uL (ref 150–440)
RBC: 3.74 10*6/uL — ABNORMAL LOW (ref 3.80–5.20)
RDW: 13.4 % (ref 11.5–14.5)
WBC: 10.3 10*3/uL (ref 3.6–11.0)

## 2014-12-23 LAB — HEMATOCRIT: HCT: 33.8 % — ABNORMAL LOW (ref 35.0–47.0)

## 2015-01-02 LAB — CBC
HCT: 37.3 % (ref 35.0–47.0)
HGB: 12.2 g/dL (ref 12.0–16.0)
MCH: 28.8 pg (ref 26.0–34.0)
MCHC: 32.8 g/dL (ref 32.0–36.0)
MCV: 88 fL (ref 80–100)
Platelet: 340 10*3/uL (ref 150–440)
RBC: 4.24 10*6/uL (ref 3.80–5.20)
RDW: 13.5 % (ref 11.5–14.5)
WBC: 11 10*3/uL (ref 3.6–11.0)

## 2015-01-02 LAB — COMPREHENSIVE METABOLIC PANEL
ALBUMIN: 3.4 g/dL — AB
ALT: 35 U/L
Alkaline Phosphatase: 114 U/L
Anion Gap: 9 (ref 7–16)
BUN: 15 mg/dL
Bilirubin,Total: <0.1 mg/dL — ABNORMAL LOW
Calcium, Total: 9.1 mg/dL
Chloride: 109 mmol/L
Co2: 23 mmol/L
Creatinine: 0.84 mg/dL
EGFR (Non-African Amer.): >60
Glucose: 100 mg/dL — ABNORMAL HIGH
POTASSIUM: 3.6 mmol/L
SGOT(AST): 29 U/L
SODIUM: 141 mmol/L
TOTAL PROTEIN: 7.4 g/dL

## 2015-01-02 LAB — WET PREP, GENITAL

## 2015-01-03 LAB — CBC WITH DIFFERENTIAL/PLATELET
BASOS ABS: 0 10*3/uL (ref 0.0–0.1)
Basophil %: 0.3 %
EOS PCT: 2.3 %
Eosinophil #: 0.2 10*3/uL (ref 0.0–0.7)
HCT: 34.8 % — ABNORMAL LOW (ref 35.0–47.0)
HGB: 11.4 g/dL — AB (ref 12.0–16.0)
LYMPHS PCT: 29.8 %
Lymphocyte #: 2.4 10*3/uL (ref 1.0–3.6)
MCH: 28.8 pg (ref 26.0–34.0)
MCHC: 32.8 g/dL (ref 32.0–36.0)
MCV: 88 fL (ref 80–100)
Monocyte #: 0.7 x10 3/mm (ref 0.2–0.9)
Monocyte %: 8.9 %
NEUTROS ABS: 4.7 10*3/uL (ref 1.4–6.5)
NEUTROS PCT: 58.7 %
Platelet: 280 10*3/uL (ref 150–440)
RBC: 3.97 10*6/uL (ref 3.80–5.20)
RDW: 13.6 % (ref 11.5–14.5)
WBC: 7.9 10*3/uL (ref 3.6–11.0)

## 2015-01-05 ENCOUNTER — Inpatient Hospital Stay
Admit: 2015-01-05 | Disposition: A | Discharge status: Home / Self Care | Payer: Self-pay | Attending: Emergency Medicine | Admitting: Emergency Medicine

## 2015-01-05 LAB — CBC WITH DIFFERENTIAL/PLATELET
BASOS PCT: 0.3 %
Basophil #: 0 10*3/uL (ref 0.0–0.1)
Eosinophil #: 0.3 10*3/uL (ref 0.0–0.7)
Eosinophil %: 3.9 %
HCT: 35.4 % (ref 35.0–47.0)
HGB: 11.6 g/dL — AB (ref 12.0–16.0)
Lymphocyte #: 2.3 10*3/uL (ref 1.0–3.6)
Lymphocyte %: 32.5 %
MCH: 28.4 pg (ref 26.0–34.0)
MCHC: 32.6 g/dL (ref 32.0–36.0)
MCV: 87 fL (ref 80–100)
MONO ABS: 0.7 x10 3/mm (ref 0.2–0.9)
Monocyte %: 9.4 %
NEUTROS PCT: 53.9 %
Neutrophil #: 3.8 10*3/uL (ref 1.4–6.5)
Platelet: 256 10*3/uL (ref 150–440)
RBC: 4.07 10*6/uL (ref 3.80–5.20)
RDW: 13.5 % (ref 11.5–14.5)
WBC: 7 10*3/uL (ref 3.6–11.0)

## 2015-01-06 LAB — CBC WITH DIFFERENTIAL/PLATELET
BASOS PCT: 0.3 %
Basophil #: 0 10*3/uL (ref 0.0–0.1)
EOS PCT: 3.3 %
Eosinophil #: 0.3 10*3/uL (ref 0.0–0.7)
HCT: 34.5 % — AB (ref 35.0–47.0)
HGB: 11.3 g/dL — AB (ref 12.0–16.0)
LYMPHS PCT: 28 %
Lymphocyte #: 2.2 10*3/uL (ref 1.0–3.6)
MCH: 28.7 pg (ref 26.0–34.0)
MCHC: 32.8 g/dL (ref 32.0–36.0)
MCV: 87 fL (ref 80–100)
Monocyte #: 0.7 x10 3/mm (ref 0.2–0.9)
Monocyte %: 9 %
NEUTROS ABS: 4.6 10*3/uL (ref 1.4–6.5)
NEUTROS PCT: 59.4 %
Platelet: 235 10*3/uL (ref 150–440)
RBC: 3.94 10*6/uL (ref 3.80–5.20)
RDW: 13.2 % (ref 11.5–14.5)
WBC: 7.7 10*3/uL (ref 3.6–11.0)

## 2015-01-11 ENCOUNTER — Emergency Department
Admit: 2015-01-11 | Disposition: A | Discharge status: Home / Self Care | Payer: Self-pay | Admitting: Emergency Medicine

## 2015-01-12 LAB — CBC
HCT: 36.5 % (ref 35.0–47.0)
HGB: 12 g/dL (ref 12.0–16.0)
MCH: 28.5 pg (ref 26.0–34.0)
MCHC: 32.9 g/dL (ref 32.0–36.0)
MCV: 87 fL (ref 80–100)
Platelet: 254 10*3/uL (ref 150–440)
RBC: 4.21 10*6/uL (ref 3.80–5.20)
RDW: 13.1 % (ref 11.5–14.5)
WBC: 7.9 10*3/uL (ref 3.6–11.0)

## 2015-01-17 LAB — SURGICAL PATHOLOGY

## 2015-01-23 NOTE — Consult Note (Signed)
PATIENT NAME:  Kristi Gutierrez, Kristi Gutierrez MR#:  [id number] DATE OF BIRTH:  1981/05/12  DATE OF CONSULTATION:  01/03/2015  REFERRING PHYSICIAN:   CONSULTING PHYSICIAN:  Kristi Vanleeuwen A. Kamyia Thomason, MD  REASON FOR CONSULTATION: Incisional pain.   HISTORY OF PRESENT ILLNESS: Kristi Gutierrez is a pleasant 35 year old female with a history of recent uncomplicated vaginal delivery and attempted postpartum tubal ligation due to patient obesity. From my understanding from operative report, the fascia was not incised, and the peritoneum was not entered. She presents with 3 day history of worsening peri-incisional pain, nausea, emesis and diarrhea, also subjective fevers. She also noticed an increase in bleeding with mucus, clots, and cramping.  Did have some erythema at the incisional site which according to notes has improved. No chest pain, shortness of breath, cough, dysuria, or hematuria.   PAST MEDICAL HISTORY: Migraines pseudotumor cerebri, morbid obesity, attempted bilateral tubal ligation.   MEDICATIONS: Prenatal vitamins, ibuprofen.   ALLERGIES: No known drug allergies.   SOCIAL HISTORY: Denies tobacco or alcohol use.   FAMILY HISTORY: Negative.  REVIEW OF SYSTEMS:  Full review obtained. Pertinent positives and negatives as above.   PHYSICAL EXAMINATION:  VITAL SIGNS: Temperature 98.8, pulse 74, blood pressure 110/64, respirations 20, 98% on room air.  GENERAL: No acute distress, alert and oriented x 3.  HEAD: Normocephalic, atraumatic.  EYES: No scleral icterus. No conjunctivitis.  FACE:  No obvious facial trauma.  EARS AND NOSE:  Normal external nose. Normal external ears.  CHEST: Lungs clear to auscultation bilaterally.  ABDOMEN: Soft, tender around the incision.  Mild erythema. No purulence, less tender lateral to incision, nondistended, no peritonitis.  EXTREMITIES: Moves all extremities well. Strength 5/5.  NEUROLOGIC: Cranial nerves II through XII grossly intact.  Sensation intact to all 4  extremities.   LABORATORY DATA: White cell count currently at 7.9, 59% neutrophils.  A CT scan shows small superficial fluid collection as well as some thickening of the rectus muscle on the right side. No air or gas, sterility indeterminate.   ASSESSMENT AND PLAN: Kristi Gutierrez is a pleasant 34 year old female with history of peri-incisional pain following failed tubal ligation, some erythema, on oral antibiotics has improved. Does have fluid of fascia, but no intraabdominal process due to resolving erythema, and no obvious signs of infection on CT scan.  Would not recommend opening incision at this time. If erythema gets worse or there is air on repeat studies or patient starts showing systemic signs of inflammation, would consider opening wound, but no surgical indications at this time.   ____________________________ Kristi Norfolk Jamell Laymon, MD cal:sp D: 01/03/2015 09:40:35 ET T: 01/03/2015 09:58:00 ET JOB#: [id number]  cc: Kristi Gave A. Elanah Osmanovic, MD, <Dictator> Kristi Parkins MD ELECTRONICALLY SIGNED 01/04/2015 17:41

## 2015-01-23 NOTE — Op Note (Signed)
PATIENT NAME:  Kristi Gutierrez, Kristi Gutierrez MR#:  [id number] DATE OF BIRTH:  1981-03-17  DATE OF PROCEDURE:  10/08/2014  PREOPERATIVE DIAGNOSIS: Symptomatic dorsal ganglion, left wrist.   POSTOPERATIVE DIAGNOSIS: Symptomatic dorsal ganglion, left wrist.   PROCEDURE: Excision of symptomatic ganglion, dorsum left wrist.   SURGEON: Christophe Louis, M.D.   ANESTHESIA: General.   COMPLICATIONS: None.   TOURNIQUET TIME: 23 minutes.   DESCRIPTION OF PROCEDURE: After adequate induction of general anesthesia, the left upper extremity is thoroughly prepped with alcohol and ChloraPrep and draped in standard sterile fashion. A transverse incision is made directly over the prominence of the ganglion. The dissection is carefully carried down under loupe magnification. Tendons are identified and retracted to each side. The ganglion is completely dissected out all the way down to the joint capsule, which appeared to be at the base of the fourth metacarpal. The rongeur is used to remove any residual pieces of ganglion. Specimen is sent to pathology. Then 1 cm in diameter of dorsal capsule is removed. The wound is thoroughly irrigated multiple times. A single 5-0 Vicryl suture is placed subcutaneously. The skin is closed with a running subcuticular 3-0 Prolene. Prior to this, the subcutaneous tissue was injected with 0.5% plain Marcaine and was thoroughly irrigated. A soft bulky dressing with a volar splint is applied. The patient is returned to the recovery room in satisfactory condition having tolerated the procedure quite well.  ____________________________ Lucas Mallow, MD ces:sb D: 10/08/2014 11:00:02 ET T: 10/08/2014 12:12:24 ET JOB#: [id number]  cc: Lucas Mallow, MD, <Dictator> Lucas Mallow MD ELECTRONICALLY SIGNED 10/13/2014 15:54

## 2015-01-23 NOTE — Op Note (Signed)
PATIENT NAME:  Kristi Gutierrez, Kristi Gutierrez MR#:  [id number] DATE OF BIRTH:  [birth year]  DATE OF PROCEDURE:  12/23/2014  PREOPERATIVE DIAGNOSES: 1.  Multiparity.  2.  Postpartum, desires sterilization.   POSTOPERATIVE DIAGNOSES: 1.  Multiparity.  2.  Postpartum, desires sterilization.   PROCEDURE:  Attempted postpartum tubal ligation, aborted due to patient habitus.   ANESTHESIA:  General.   SURGEON:  Will Bonnet, MD   ASSISTANT SURGEON:  Aletha Halim, MD   ESTIMATED BLOOD LOSS:  25 mL.   OPERATIVE FLUIDS:  1000 mL of crystalloid.   COMPLICATIONS:  None.   FINDINGS:  Unable to safely enter the abdominal cavity even after extending incision to about 3.5 inches. Procedure will require much larger incision to accomplish safely.   SPECIMENS:  None.   CONDITION AT THE END OF PROCEDURE:  Stable.   PROCEDURE IN DETAIL:  The patient was taken to the operating room, where general anesthesia was induced. The patient was placed in the dorsal supine position. After a timeout was called, local anesthetic was injected in the infraumbilical area, and an infraumbilical incision was made with a scalpel and extended for approximately 2 inches. The underlying subcutaneous tissue was then carefully dissected. It was very difficult to reach the fascial layers. The incision was extended to approximately 3.5 inches. Again, the fascia was able to be visualized, and it was transected sharply using Metzenbaum scissors and opened cranially and caudally using sharp dissection. After grasping each edge of the fascial opening with Kocher clamps, the preperitoneal fat and peritoneal areas were tented up. The preperitoneal fat was quite dense and quite deep, and at this point, there was a great amount of depth to the incision already and it was very difficult to reach in safely with the instruments and attempt to grasp the peritoneum. At this point, decision between extending this incision further and aborting the  procedure had to be made. Given that this would be essentially a laparotomy incision that was larger already and have to be extended further, it was decided not to extend the incision further because no guarantee would be that we would still be able to reach the peritoneal opening safely even with a greater incision, causing the patient great morbidity. There was hemostasis. The fascia was carefully reapproximated using #0 Vicryl in a running fashion. The closure was checked, and there was no defect at the end of the procedure. The subcutaneous tissue was reapproximated such that no greater than 2 cm of dead space remained. The skin was closed in subcuticular fashion using a 4-0 monocryl. The skin closure was reinforced using Dermabond, and additional local anesthetic was injected.   The patient tolerated the procedure well. Sponge, lap, and needle counts were correct x 2. For VTE prophylaxis, the patient was wearing pneumatic compression stockings throughout the entire procedure. She was awakened in the operating room and taken to the recovery area in stable condition. Once the patient was awakened back in her hospital room, I discussed the intraoperative findings of the case and discussed that I was unable to safely complete the tubal ligation. The patient voiced understanding and a desire to have a tubal ligation at a later point. She will follow up soon after for discussion of possibly laparoscopic tubal ligation if appropriate.    ____________________________ Will Bonnet, MD sdj:nb D: 12/29/2014 17:57:08 ET T: 12/30/2014 02:29:31 ET JOB#: [id number]  cc: Will Bonnet, MD, <Dictator> Will Bonnet MD ELECTRONICALLY SIGNED 01/20/2015 9:49

## 2015-02-01 NOTE — H&P (Signed)
L&D Evaluation:  History:  HPI 34 year old G3 P17 Hispanic female with EDC=12/29/2014 by an 8wk6d ultrasound presents at [redacted]w[redacted]d gestational age with reports of not urinating for several hours as well as burning when she urinates. She also reports the burning when she is just sitting and not doing anything. She denies discharge, itching, cva tenderness, fever, or chills. She reports that this started suddenly today. She has been out all day standing with her daughter at a music event. She reports trying to stay hydrated.  She notes no vaginal bleeding, no leakage of fluid.  She notes positive fetal movement. 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 She has not established care with a neurologist since her move from [place]. [facility] also remarkable for BMI>43 (has lost 10# with the pregnancy-weighed 282 at her [facility] visit) and  an elevated early one hr GTT, but 3 hr was WNL.   Presents with not able to void, burning   Patient's Medical History Migraines, morbid obesity, pseudotumor cerebrei   Patient's Surgical History none  SVD in 2000 and 2007   Medications Tylenol (Acetaminophen)  occasional vitamins   Allergies NKDA   Social History none   Family History Non-Contributory   ROS:  ROS see HPI   Exam:  Vital Signs stable   Urine Protein 1+   General no apparent distress, alert, awake   Mental Status clear   Chest clear   Heart normal sinus rhythm, no murmur/gallop/rubs   Abdomen gravid/obese   Back no CVAT   Edema no edema   Pelvic no external lesions, thin white discharge on spec exam   Mebranes Intact   FHT normal rate with no decels, 130/mod var/+accel/no decels   Ucx absent   Skin dry   Other wet prep- +clue, +whiff, no yeast, hyphae, or trich seen UA- negative for ketones, glucose, protein, nitrites, or leuks. 1 WBC, <1 RBC, no bacteria, 1 epi cell, mucous present   Impression:  Impression  1) Intrauterine pregnancy at [redacted]w[redacted]d gestational age, 2) BV   Plan:  Plan discharge home with rx for flagyl   Follow Up Appointment already scheduled   Electronic Signatures: Louisa Second (CNM)  (Signed 13-Mar-16 01:12)  Authored: L&D Evaluation   Last Updated: 13-Mar-16 01:12 by Louisa Second (CNM)

## 2015-02-01 NOTE — H&P (Signed)
L&D Evaluation:  History:  HPI 34 year old G3 P31 Hispanic female with EDC=12/29/2014 by an 8wk6d ultrasound presents at 23 weeks with c/o of a migraine headache x 2 days. This is "like her typical migraine", it is frontal, and accompanied by nausea and vomiting and diplopia/blurred vision. Denies paresthesias or slurred speech. Tylenol has not provided relief. Able to keep down a cup of Fruit punch today. Vomited after trying to eat a salad. Denies nasal congestion, cold sx, sinus congestion, rhinorrhea. 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 She also complains of low pain pain bilaterally RT>LT and initially described right flank pain. No dysuria or bad odor to urine. She has been under a lot of stress recently -with the death of her best friend and then her burial. Her husband is disabled and she has two jobs. She has not established care with a neurologist since her move from [place]. [facility] also remarkable for BMI>43 (has lost 10# with the pregnancy-weighed 282 at her [place] visit) and  an elevated early one hr GTT, but 3 hr was WNL.   Presents with headache, N/V and low back pain   Patient's Medical History Migraines, morbid obesity, pseudotumor cerebrei   Patient's Surgical History none  SVD in 2000 and 2007   Medications Tylenol (Acetaminophen)  occasional vitamins   Allergies NKDA   Social History none   Family History Non-Contributory   ROS:  ROS see HPI   Exam:  Vital Signs stable  114/61. 98.4   Urine Protein 1+, sp grav=1.029, neg ketones, trace leuks. 3RBC/HPF, 8WBC/HPF, 12 epi cells/HPF   General no apparent distress, alert, awake, very talkative   Mental Status clear   Chest clear   Heart normal sinus rhythm, no murmur/gallop/rubs   Abdomen gravid/obese   Back no CVAT, tenderness over SI areas bilaterally   Edema 1+  Nonpitting   Mebranes Intact   FHT normal rate with no decels, 140s    Ucx absent   Skin dry   Other PEARL. Nasal mucosa is inflammed. CN IV to XII intact.   Impression:  Impression IUP at 23 weeks with headache and N/V-.probable migraine  LBP due to MSK etiology.   Plan:  Plan IV fluids and IV Compazine. O2 at 2 L nasal cannula. If headache unresolved with Compazine-will try Demerol. ice chips for now. Try liquids after Compazine. Discussed case with Dr Georgianne Fick. Will get neuro consult as OP.   Electronic Signatures: Karene Fry (CNM)  (Signed 10-Dec-15 00:52)  Authored: L&D Evaluation   Last Updated: 10-Dec-15 00:52 by Karene Fry (CNM)

## 2015-02-01 NOTE — H&P (Signed)
L&D Evaluation:  History:  HPI -CC: no FM today -HPI: 34 y/o G3P2002 @ 28/3 (EDC 4/6) with above CC. Preg c/b BMI 46, h/o migraines and pseudotumor and POD#1 from left wrist ganglion cyst removal. No labor s/s.   Medications none   Allergies NKDA   Exam:  Vital Signs af vs normal and stable   General no apparent distress   Abdomen gravid, non-tender, obese   Mebranes Intact   FHT 140 baseline, +accels, no decels, mod var   Ucx absent   Impression:  Impression decreased fetal movement   Plan:  Comments rNST. [place] precautions given d/c to home   Follow Up Appointment already scheduled   Electronic Signatures: Aletha Halim (MD)  (Signed 16-Jan-16 16:20)  Authored: L&D Evaluation   Last Updated: 16-Jan-16 16:20 by Aletha Halim (MD)

## 2015-02-01 NOTE — H&P (Signed)
L&D Evaluation:  History Expanded:  HPI 34 year old G3 P64 Hispanic female with EDC=12/29/2014 by an 8wk6d ultrasound presents at [redacted]w[redacted]d gestational age with uterine cramping and back pain. This has been going on for a week, but worse today.  She notes no vaginal bleeding, no leakage of fluid.  She notes positive fetal movement.  She denies urinary symptoms and vaginal symptoms. 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 She has not established care with a neurologist since her move from [place]. [facility] also remarkable for BMI>43 (has lost 10# with the pregnancy-weighed 282 at her [place] visit) and  an elevated early one hr GTT, but 3 hr was WNL.   Patient's Medical History Migraines, morbid obesity, pseudotumor cerebrei   Patient's Surgical History none  SVD in 2000 and 2007   Medications Tylenol (Acetaminophen)  occasional vitamins   Allergies NKDA   Social History none   Family History Non-Contributory   ROS:  ROS see HPI   Exam:  Vital Signs stable  T97.9, BP130/64, P87   Urine Protein 1+   General no apparent distress, alert, awake   Mental Status clear   Chest clear   Heart normal sinus rhythm, no murmur/gallop/rubs   Abdomen gravid/obese   Back no CVAT   Edema no edema   Pelvic no external lesions, closed, but external os open/50%/OOP   Mebranes Intact   FHT normal rate with no decels, 130/mod var/+accel/no decels   Ucx absent   Skin dry   Impression:  Impression 1) Intrauterine pregnancy at [redacted]w[redacted]d gestational age, 2) uterine cramping   Plan:  Comments 1) UA, wet prep, fFN, chlamydia/gonorrhea NAAT 2) no signs of labor   Labs:  Lab Results: Routine UA:  11-Feb-16 23:53   Color (UA) Yellow  Clarity (UA) Cloudy  Glucose (UA) Negative  Bilirubin (UA) Negative  Ketones (UA) Negative  Specific Gravity (UA) 1.008  Blood (UA) Negative  pH (UA) 6.0  Protein (UA) Negative  Nitrite  (UA) Negative  Leukocyte Esterase (UA) 1+ (Result(s) reported on 05 Nov 2014 at 12:30AM.)  RBC (UA) 1 /HPF  WBC (UA) 8 /HPF  Bacteria (UA) TRACE  Epithelial Cells (UA) 13 /HPF  Mucous (UA) PRESENT (Result(s) reported on 05 Nov 2014 at 12:30AM.)   Electronic Signatures: Will Bonnet (MD)  (Signed 12-Feb-16 02:33)  Authored: L&D Evaluation, Labs   Last Updated: 12-Feb-16 02:33 by Will Bonnet (MD)

## 2015-02-23 ENCOUNTER — Encounter: Payer: Self-pay | Admitting: *Deleted

## 2015-02-23 ENCOUNTER — Inpatient Hospital Stay: Admission: RE | Admit: 2015-02-23 | Payer: Medicaid - Out of State | Source: Ambulatory Visit

## 2015-02-23 DIAGNOSIS — Z302 Encounter for sterilization: Secondary | ICD-10-CM | POA: Diagnosis not present

## 2015-02-23 DIAGNOSIS — R51 Headache: Secondary | ICD-10-CM | POA: Diagnosis not present

## 2015-02-23 DIAGNOSIS — Z833 Family history of diabetes mellitus: Secondary | ICD-10-CM | POA: Diagnosis not present

## 2015-02-23 DIAGNOSIS — Z8261 Family history of arthritis: Secondary | ICD-10-CM | POA: Diagnosis not present

## 2015-02-23 DIAGNOSIS — G932 Benign intracranial hypertension: Secondary | ICD-10-CM | POA: Diagnosis not present

## 2015-02-23 DIAGNOSIS — Z6841 Body Mass Index (BMI) 40.0 and over, adult: Secondary | ICD-10-CM | POA: Diagnosis not present

## 2015-02-23 NOTE — Patient Instructions (Signed)
  Your procedure is scheduled on: 03-01-15 Report to [facility] To find out your arrival time please call [phone] between 1PM - 3PM on 02-28-15 [facility])  Remember: Instructions that are not followed completely may result in serious medical risk, up to and including death, or upon the discretion of your surgeon and anesthesiologist your surgery may need to be rescheduled.    __X__ 1. Do not eat food or drink liquids after midnight. No gum chewing or hard candies.     __X__ 2. No Alcohol for 24 hours before or after surgery.   ____ 3. Bring all medications with you on the day of surgery if instructed.    __X__ 4. Notify your doctor if there is any change in your medical condition     (cold, fever, infections).     Do not wear jewelry, make-up, hairpins, clips or nail polish.  Do not wear lotions, powders, or perfumes. You may wear deodorant.  Do not shave 48 hours prior to surgery. Men may shave face and neck.  Do not bring valuables to the hospital.    [facility] is not responsible for any belongings or valuables.               Contacts, dentures or bridgework may not be worn into surgery.  Leave your suitcase in the car. After surgery it may be brought to your room.  For patients admitted to the hospital, discharge time is determined by your treatment team.   Patients discharged the day of surgery will not be allowed to drive home.   Please read over the following fact sheets that you were given:     ____ Take these medicines the morning of surgery with A SIP OF WATER:    1. NONE  2.   3.   4.  5.  6.  ____ Fleet Enema (as directed)   __X__ Use CHG Soap as directed  ____ Use inhalers on the day of surgery  ____ Stop metformin 2 days prior to surgery    ____ Take 1/2 of usual insulin dose the night before surgery and none on the morning of surgery.   ____ Stop Coumadin/Plavix/aspirin-N/A   ____ Stop Anti-inflammatories-NO NSAIDS OR  ASPIRIN PRODUCTS-TYLENOL OK   ____ Stop supplements until after surgery.    ____ Bring C-Pap to the hospital.

## 2015-02-24 ENCOUNTER — Encounter
Admission: RE | Admit: 2015-02-24 | Discharge: 2015-02-24 | Disposition: A | Discharge status: Home / Self Care | Payer: Medicaid - Out of State | Source: Ambulatory Visit | Attending: Obstetrics and Gynecology | Admitting: Obstetrics and Gynecology

## 2015-02-24 DIAGNOSIS — E669 Obesity, unspecified: Secondary | ICD-10-CM | POA: Insufficient documentation

## 2015-02-24 DIAGNOSIS — Z01810 Encounter for preprocedural cardiovascular examination: Secondary | ICD-10-CM | POA: Insufficient documentation

## 2015-02-24 LAB — CBC
HEMATOCRIT: 36.7 % (ref 35.0–47.0)
Hemoglobin: 12.1 g/dL (ref 12.0–16.0)
MCH: 28.6 pg (ref 26.0–34.0)
MCHC: 32.8 g/dL (ref 32.0–36.0)
MCV: 87 fL (ref 80.0–100.0)
Platelets: 210 10*3/uL (ref 150–440)
RBC: 4.22 MIL/uL (ref 3.80–5.20)
RDW: 13.2 % (ref 11.5–14.5)
WBC: 7.2 10*3/uL (ref 3.6–11.0)

## 2015-02-24 LAB — COMPREHENSIVE METABOLIC PANEL
ALT: 34 U/L (ref 14–54)
ANION GAP: 4 — AB (ref 5–15)
AST: 22 U/L (ref 15–41)
Albumin: 3.8 g/dL (ref 3.5–5.0)
Alkaline Phosphatase: 86 U/L (ref 38–126)
BUN: 15 mg/dL (ref 6–20)
CO2: 28 mmol/L (ref 22–32)
Calcium: 8.9 mg/dL (ref 8.9–10.3)
Chloride: 108 mmol/L (ref 101–111)
Creatinine, Ser: 0.83 mg/dL (ref 0.44–1.00)
GFR calc non Af Amer: >60 mL/min (ref >60)
Glucose, Bld: 90 mg/dL (ref 65–99)
Potassium: 4.2 mmol/L (ref 3.5–5.1)
SODIUM: 140 mmol/L (ref 135–145)
Total Bilirubin: 0.5 mg/dL (ref 0.3–1.2)
Total Protein: 7.6 g/dL (ref 6.5–8.1)

## 2015-02-24 LAB — TYPE AND SCREEN
ABO/RH(D): O POS
ANTIBODY SCREEN: NEGATIVE

## 2015-02-24 LAB — ABO/RH: ABO/RH(D): O POS

## 2015-02-28 ENCOUNTER — Other Ambulatory Visit: Payer: Medicaid - Out of State

## 2015-03-01 ENCOUNTER — Ambulatory Visit: Payer: Medicaid Other | Admitting: Anesthesiology

## 2015-03-01 ENCOUNTER — Encounter
Admission: RE | Disposition: A | Discharge status: Home / Self Care | Payer: Self-pay | Source: Ambulatory Visit | Attending: Obstetrics and Gynecology

## 2015-03-01 ENCOUNTER — Encounter: Payer: Self-pay | Admitting: *Deleted

## 2015-03-01 ENCOUNTER — Ambulatory Visit
Admission: RE | Admit: 2015-03-01 | Discharge: 2015-03-01 | Disposition: A | Discharge status: Home / Self Care | Payer: Medicaid Other | Source: Ambulatory Visit | Attending: Obstetrics and Gynecology | Admitting: Obstetrics and Gynecology

## 2015-03-01 DIAGNOSIS — Z6841 Body Mass Index (BMI) 40.0 and over, adult: Secondary | ICD-10-CM | POA: Insufficient documentation

## 2015-03-01 DIAGNOSIS — Z302 Encounter for sterilization: Secondary | ICD-10-CM

## 2015-03-01 DIAGNOSIS — Z833 Family history of diabetes mellitus: Secondary | ICD-10-CM | POA: Insufficient documentation

## 2015-03-01 DIAGNOSIS — Z8261 Family history of arthritis: Secondary | ICD-10-CM | POA: Insufficient documentation

## 2015-03-01 DIAGNOSIS — G932 Benign intracranial hypertension: Secondary | ICD-10-CM | POA: Insufficient documentation

## 2015-03-01 DIAGNOSIS — R51 Headache: Secondary | ICD-10-CM | POA: Insufficient documentation

## 2015-03-01 HISTORY — PX: LAPAROSCOPIC TUBAL LIGATION: SHX1937

## 2015-03-01 LAB — TYPE AND SCREEN
ABO/RH(D): O POS
ANTIBODY SCREEN: NEGATIVE

## 2015-03-01 LAB — POCT PREGNANCY, URINE: Preg Test, Ur: NEGATIVE

## 2015-03-01 SURGERY — LIGATION, FALLOPIAN TUBE, LAPAROSCOPIC
Anesthesia: General | Laterality: Bilateral

## 2015-03-01 MED ORDER — PHENYLEPHRINE HCL 10 MG/ML IJ SOLN
INTRAMUSCULAR | Status: DC | PRN
  Administered 2015-03-01: 100 ug via INTRAVENOUS

## 2015-03-01 MED ORDER — ROCURONIUM BROMIDE 100 MG/10ML IV SOLN
INTRAVENOUS | Status: DC | PRN
  Administered 2015-03-01: 30 mg via INTRAVENOUS
  Administered 2015-03-01 (×2): 10 mg via INTRAVENOUS

## 2015-03-01 MED ORDER — LACTATED RINGERS IV SOLN
INTRAVENOUS | Status: DC
  Administered 2015-03-01: 07:00:00 via INTRAVENOUS

## 2015-03-01 MED ORDER — NEOSTIGMINE METHYLSULFATE 10 MG/10ML IV SOLN
INTRAVENOUS | Status: DC | PRN
  Administered 2015-03-01: 3 mg via INTRAVENOUS

## 2015-03-01 MED ORDER — HYDROMORPHONE HCL 1 MG/ML IJ SOLN
INTRAMUSCULAR | Status: AC
  Administered 2015-03-01: 0.25 mg via INTRAVENOUS
  Filled 2015-03-01: qty 1

## 2015-03-01 MED ORDER — BUPIVACAINE HCL (PF) 0.5 % IJ SOLN
INTRAMUSCULAR | Status: DC | PRN
  Administered 2015-03-01: 17 mL

## 2015-03-01 MED ORDER — LIDOCAINE HCL (CARDIAC) 20 MG/ML IV SOLN
INTRAVENOUS | Status: DC | PRN
  Administered 2015-03-01: 100 mg via INTRAVENOUS

## 2015-03-01 MED ORDER — FENTANYL CITRATE (PF) 100 MCG/2ML IJ SOLN: 25.0000 ug | INTRAMUSCULAR | Status: DC | PRN

## 2015-03-01 MED ORDER — MIDAZOLAM HCL 2 MG/2ML IJ SOLN
INTRAMUSCULAR | Status: DC | PRN
  Administered 2015-03-01: 2 mg via INTRAVENOUS

## 2015-03-01 MED ORDER — HYDROCODONE-ACETAMINOPHEN 5-325 MG PO TABS
ORAL_TABLET | ORAL | Status: DC
Start: 2015-03-01 — End: 2015-03-01
  Filled 2015-03-01: qty 2

## 2015-03-01 MED ORDER — DEXAMETHASONE SODIUM PHOSPHATE 4 MG/ML IJ SOLN
INTRAMUSCULAR | Status: DC | PRN
  Administered 2015-03-01: 10 mg via INTRAVENOUS

## 2015-03-01 MED ORDER — BUPIVACAINE HCL (PF) 0.5 % IJ SOLN
INTRAMUSCULAR | Status: AC
  Filled 2015-03-01: qty 30

## 2015-03-01 MED ORDER — EPHEDRINE SULFATE 50 MG/ML IJ SOLN
INTRAMUSCULAR | Status: DC | PRN
  Administered 2015-03-01 (×2): 10 mg via INTRAVENOUS

## 2015-03-01 MED ORDER — HYDROMORPHONE HCL 1 MG/ML IJ SOLN
0.2500 mg | INTRAMUSCULAR | Status: DC | PRN
  Administered 2015-03-01 (×8): 0.25 mg via INTRAVENOUS

## 2015-03-01 MED ORDER — FAMOTIDINE 20 MG PO TABS
20.0000 mg | ORAL_TABLET | Freq: Once | ORAL | Status: AC
  Administered 2015-03-01: 20 mg via ORAL

## 2015-03-01 MED ORDER — HYDROCODONE-ACETAMINOPHEN 5-325 MG PO TABS
2.0000 | ORAL_TABLET | Freq: Four times a day (QID) | ORAL | Status: DC | PRN
  Administered 2015-03-01: 2 via ORAL

## 2015-03-01 MED ORDER — IBUPROFEN 600 MG PO TABS
600.0000 mg | ORAL_TABLET | Freq: Four times a day (QID) | ORAL | Status: DC | PRN
Start: 2015-03-01 — End: 2015-03-23

## 2015-03-01 MED ORDER — ONDANSETRON HCL 4 MG/2ML IJ SOLN: 4.0000 mg | Freq: Once | INTRAMUSCULAR | Status: DC | PRN

## 2015-03-01 MED ORDER — FENTANYL CITRATE (PF) 100 MCG/2ML IJ SOLN
INTRAMUSCULAR | Status: DC | PRN
  Administered 2015-03-01: 100 ug via INTRAVENOUS
  Administered 2015-03-01 (×2): 50 ug via INTRAVENOUS
  Administered 2015-03-01: 100 ug via INTRAVENOUS

## 2015-03-01 MED ORDER — PROPOFOL 10 MG/ML IV BOLUS
INTRAVENOUS | Status: DC | PRN
  Administered 2015-03-01: 200 mg via INTRAVENOUS

## 2015-03-01 MED ORDER — ONDANSETRON HCL 4 MG/2ML IJ SOLN
INTRAMUSCULAR | Status: DC | PRN
  Administered 2015-03-01: 4 mg via INTRAVENOUS

## 2015-03-01 MED ORDER — FAMOTIDINE 20 MG PO TABS
ORAL_TABLET | ORAL | Status: AC
  Administered 2015-03-01: 20 mg via ORAL
  Filled 2015-03-01: qty 1

## 2015-03-01 MED ORDER — GLYCOPYRROLATE 0.2 MG/ML IJ SOLN
INTRAMUSCULAR | Status: DC | PRN
  Administered 2015-03-01: 0.6 mg via INTRAVENOUS

## 2015-03-01 MED ORDER — HYDROCODONE-ACETAMINOPHEN 5-325 MG PO TABS: 2.0000 | ORAL_TABLET | Freq: Four times a day (QID) | ORAL | Status: DC | PRN

## 2015-03-01 MED ORDER — BUPIVACAINE-EPINEPHRINE (PF) 0.25% -1:200000 IJ SOLN: INTRAMUSCULAR | Status: DC | PRN

## 2015-03-01 SURGICAL SUPPLY — 25 items
BLADE SURG SZ11 CARB STEEL (BLADE) ×2 IMPLANT
CATH ROBINSON RED A/P 16FR (CATHETERS) ×2 IMPLANT
CHLORAPREP W/TINT 26ML (MISCELLANEOUS) ×2 IMPLANT
DRAPE LAPAROTOMY 100X77 ABD (DRAPES) ×2 IMPLANT
DRAPE LEGGINS SURG 28X43 STRL (DRAPES) ×2 IMPLANT
FILSHIE ×2 IMPLANT
GLOVE BIO SURGEON STRL SZ7 (GLOVE) ×6 IMPLANT
GLOVE BIOGEL PI IND STRL 7.5 (GLOVE) ×3 IMPLANT
GLOVE BIOGEL PI INDICATOR 7.5 (GLOVE) ×3
GOWN STRL REUS W/ TWL LRG LVL3 (GOWN DISPOSABLE) ×2 IMPLANT
GOWN STRL REUS W/TWL LRG LVL3 (GOWN DISPOSABLE) ×2
KIT RM TURNOVER CYSTO AR (KITS) ×2 IMPLANT
LABEL OR SOLS (LABEL) ×2 IMPLANT
LIQUID BAND (GAUZE/BANDAGES/DRESSINGS) ×2 IMPLANT
NS IRRIG 500ML POUR BTL (IV SOLUTION) ×2 IMPLANT
PACK GYN LAPAROSCOPIC (MISCELLANEOUS) ×2 IMPLANT
PAD OB MATERNITY 4.3X12.25 (PERSONAL CARE ITEMS) ×2 IMPLANT
PAD PREP 24X41 OB/GYN DISP (PERSONAL CARE ITEMS) ×2 IMPLANT
SUT MNCRL 3-0 UNDYED SH (SUTURE) ×1 IMPLANT
SUT MONOCRYL 3-0 UNDYED (SUTURE) ×1
SUT VIC AB 4-0 FS2 27 (SUTURE) ×2 IMPLANT
TROCAR 12M 150ML BLUNT (TROCAR) ×2 IMPLANT
TROCAR 5M 150ML BLDLS (TROCAR) ×2 IMPLANT
TROCAR XCEL NON-BLD 5MMX100MML (ENDOMECHANICALS) ×2 IMPLANT
TUBING INSUFFLATOR HI FLOW (MISCELLANEOUS) ×2 IMPLANT

## 2015-03-01 NOTE — Discharge Instructions (Addendum)
AMBULATORY SURGERY  DISCHARGE INSTRUCTIONS   1) The drugs that you were given will stay in your system until tomorrow so for the next 24 hours you should not:  A) Drive an automobile B) Make any legal decisions C) Drink any alcoholic beverage   2) You may resume regular meals tomorrow.  Today it is better to start with liquids and gradually work up to solid foods.  You may eat anything you prefer, but it is better to start with liquids, then soup and crackers, and gradually work up to solid foods.   3) Please notify your doctor immediately if you have any unusual bleeding, trouble breathing, redness and pain at the surgery site, drainage, fever, or pain not relieved by medication. 4)   5) Your post-operative visit with Dr.                                     is: Date:                        Time:    Please call to schedule your post-operative visit.  6) Additional Instructions: AMBULATORY SURGERY  DISCHARGE INSTRUCTIONS   The drugs that you were given will stay in your system until tomorrow so for the next 24 hours you should not:  Drive an automobile Make any legal decisions Drink any alcoholic beverage   You may resume regular meals tomorrow.  Today it is better to start with liquids and gradually work up to solid foods.  You may eat anything you prefer, but it is better to start with liquids, then soup and crackers, and gradually work up to solid foods.   Please notify your doctor immediately if you have any unusual bleeding, trouble breathing, redness and pain at the surgery site, drainage, fever, or pain not relieved by medication.                        Additional Instructions:

## 2015-03-01 NOTE — Anesthesia Postprocedure Evaluation (Signed)
  Anesthesia Post-op Note  Patient: Kristi Gutierrez  Procedure(s) Performed: Procedure(s): LAPAROSCOPIC TUBAL LIGATION (Bilateral)  Anesthesia type:General  Patient location: PACU  Post pain: Pain level controlled  Post assessment: Post-op Vital signs reviewed, Patient's Cardiovascular Status Stable, Respiratory Function Stable, Patent Airway and No signs of Nausea or vomiting  Post vital signs: Reviewed and stable  Last Vitals:  Filed Vitals:   03/01/15 0631  BP: 104/60  Pulse: 81  Temp: 36.6 C  Resp: 16    Level of consciousness: awake, alert  and patient cooperative  Complications: No apparent anesthesia complications

## 2015-03-01 NOTE — Anesthesia Preprocedure Evaluation (Signed)
Anesthesia Evaluation  Patient identified by MRN, date of birth, ID band Patient awake    Reviewed: Allergy & Precautions, NPO status , Patient's Chart, lab work & pertinent test results  History of Anesthesia Complications Negative for: history of anesthetic complications  Airway Mallampati: II  TM Distance: >3 FB Neck ROM: Full    Dental no notable dental hx.    Pulmonary neg pulmonary ROS,  breath sounds clear to auscultation  Pulmonary exam normal       Cardiovascular Exercise Tolerance: Good Normal cardiovascular examRhythm:Regular Rate:Normal     Neuro/Psych  Headaches, Pseudotumor cerebri negative psych ROS   GI/Hepatic negative GI ROS, Neg liver ROS,   Endo/Other  negative endocrine ROS  Renal/GU negative Renal ROS  negative genitourinary   Musculoskeletal negative musculoskeletal ROS (+)   Abdominal   Peds negative pediatric ROS (+)  Hematology negative hematology ROS (+)   Anesthesia Other Findings   Reproductive/Obstetrics negative OB ROS                             Anesthesia Physical Anesthesia Plan  ASA: II  Anesthesia Plan: General   Post-op Pain Management:    Induction: Intravenous  Airway Management Planned: Oral ETT  Additional Equipment:   Intra-op Plan:   Post-operative Plan: Extubation in OR  Informed Consent: I have reviewed the patients History and Physical, chart, labs and discussed the procedure including the risks, benefits and alternatives for the proposed anesthesia with the patient or authorized representative who has indicated his/her understanding and acceptance.   Dental advisory given  Plan Discussed with: CRNA and Surgeon  Anesthesia Plan Comments:         Anesthesia Quick Evaluation

## 2015-03-01 NOTE — Anesthesia Postprocedure Evaluation (Signed)
  Anesthesia Post-op Note  Patient: Kristi Gutierrez  Procedure(s) Performed: Procedure(s): LAPAROSCOPIC TUBAL LIGATION (Bilateral)  Anesthesia type:General  Patient location: PACU  Post pain: Pain level controlled  Post assessment: Post-op Vital signs reviewed, Patient's Cardiovascular Status Stable, Respiratory Function Stable, Patent Airway and No signs of Nausea or vomiting  Post vital signs: Reviewed and stable  Last Vitals:  Filed Vitals:   03/01/15 1322  BP:   Pulse: 82  Temp:   Resp: 18    Level of consciousness: awake, alert  and patient cooperative  Complications: No apparent anesthesia complications

## 2015-03-01 NOTE — Transfer of Care (Signed)
Immediate Anesthesia Transfer of Care Note  Patient: Kristi Gutierrez  Procedure(s) Performed: Procedure(s): LAPAROSCOPIC TUBAL LIGATION (Bilateral)  Patient Location: PACU  Anesthesia Type:General  Level of Consciousness: awake, alert , oriented and patient cooperative  Airway & Oxygen Therapy: Patient Spontanous Breathing and Patient connected to face mask oxygen  Post-op Assessment: Report given to RN and Post -op Vital signs reviewed and stable  Post vital signs: Reviewed and stable  Last Vitals:  Filed Vitals:   03/01/15 0631  BP: 104/60  Pulse: 81  Temp: 36.6 C  Resp: 16    Complications: No apparent anesthesia complications

## 2015-03-01 NOTE — Op Note (Signed)
Operative Report   Pre-Op Diagnosis: multiparity, desires permanent sterilization  Post-Op Diagnosis: multiparity, desires permanent sterilization  Procedures:  Laparoscopic bilateral tubal ligation using Filshie clips  Primary Surgeon: Dr. Prentice Docker   EBL: minimal  IVF: 450 mL  Urine output: 500 mL  Specimens: None  Drains: None   Complications: None   Disposition: PACU   Condition: Stable   Findings: Normal appearing uterus, bilateral fallopian tubes, and ovaries  Procedure Summary:  After adequate general anesthesia was obtained, the patient was placed in dorsal lithotomy positioning in [place] and prepped & draped in the usual, sterile fashion.  After injection of local anesthetic, a  29mm infraumbilical incision was made with the scalpel.  Entry into the abdominal cavity was obtained via the Optiview trocar method. Verification of abdominal entry was obtained by opening pressures. The camera was reintroduced through the trocar and verification of atraumatic entry was obtained.  Abdomen insufflated with CO2 gas without incident to approximately 3 liters.  Patient placed in Trendelenburg positioning.  12 mm suprapubic incision made and accessory port advanced under direct visualization.    Filshie applicator was advanced through the port and right fallopian tube elevated.  Filshie clip applied at the proximal tube in the mid-isthmic region with care to occlude the entire diameter of the tube.  Filshie clip applied without incident.  In a similar fashion, the contralateral tube was identified, grasped with Filshie applicator and clip was applied to the proximal tube in the isthmic region without incident.    Bilateral sites were visualized and complete occlusion confirmed.  All instruments were removed, abdomen deflated, and the suprapubic incision was closed in a subcuticular fashion. Both incisions were reapproximated using skin glue.additional and aesthetic was  given at the end of the procedure for total 17 mL's of Percent Sensorcaine plain.  All sponge, laps, and needle counts were correct times two.  The patient tolerated the procedure well and was transferred to the PACU in stable condition.    Will Bonnet, MD 03/01/2015 11:37 AM

## 2015-03-01 NOTE — H&P (Signed)
History and Physical Interval Note:  Kristi Gutierrez  has presented today for surgery, with the diagnosis of DESIRE FOR PERMANENT STERILITY  The various methods of treatment have been discussed with the patient and family. After consideration of risks, benefits and other options for treatment, the patient has consented to  Procedure(s): LAPAROSCOPIC TUBAL LIGATION (Bilateral) as a surgical intervention .  The patient's history has been reviewed, patient examined, no change in status, stable for surgery.  I have reviewed the patient's chart and labs.  Questions were answered to the patient's satisfaction.    The patient does not take a beta blocker and none is indicated for this surgery.   Will Bonnet, MD, [place] 03/01/2015 10:36 AM

## 2015-03-01 NOTE — Anesthesia Procedure Notes (Signed)
Procedure Name: Intubation Date/Time: 03/01/2015 10:35 AM Performed by: Alda Berthold Pre-anesthesia Checklist: Patient identified, Emergency Drugs available, Suction available and Patient being monitored Patient Re-evaluated:Patient Re-evaluated prior to inductionOxygen Delivery Method: Circle system utilized Preoxygenation: Pre-oxygenation with 100% oxygen Intubation Type: IV induction Laryngoscope Size: Mac and 3 Grade View: Grade I Tube type: Oral Tube size: 7.0 mm Number of attempts: 1 Placement Confirmation: ETT inserted through vocal cords under direct vision,  positive ETCO2 and breath sounds checked- equal and bilateral Secured at: 21 cm Tube secured with: Tape Dental Injury: Teeth and Oropharynx as per pre-operative assessment

## 2015-03-04 ENCOUNTER — Emergency Department
Admission: EM | Admit: 2015-03-04 | Discharge: 2015-03-05 | Disposition: A | Discharge status: Home / Self Care | Payer: Medicaid - Out of State | Attending: Emergency Medicine | Admitting: Emergency Medicine

## 2015-03-04 DIAGNOSIS — N390 Urinary tract infection, site not specified: Secondary | ICD-10-CM

## 2015-03-04 DIAGNOSIS — R110 Nausea: Secondary | ICD-10-CM | POA: Insufficient documentation

## 2015-03-04 DIAGNOSIS — R1030 Lower abdominal pain, unspecified: Secondary | ICD-10-CM | POA: Diagnosis present

## 2015-03-04 DIAGNOSIS — R109 Unspecified abdominal pain: Secondary | ICD-10-CM

## 2015-03-04 LAB — URINALYSIS COMPLETE WITH MICROSCOPIC (ARMC ONLY)
Bilirubin Urine: NEGATIVE
Glucose, UA: NEGATIVE mg/dL
Ketones, ur: NEGATIVE mg/dL
Leukocytes, UA: NEGATIVE
Nitrite: NEGATIVE
Protein, ur: 100 mg/dL — AB
Specific Gravity, Urine: 1.023 (ref 1.005–1.030)
pH: 6 (ref 5.0–8.0)

## 2015-03-04 LAB — COMPREHENSIVE METABOLIC PANEL
ALT: 30 U/L (ref 14–54)
AST: 23 U/L (ref 15–41)
Albumin: 3.8 g/dL (ref 3.5–5.0)
Alkaline Phosphatase: 91 U/L (ref 38–126)
Anion gap: 8 (ref 5–15)
BUN: 19 mg/dL (ref 6–20)
CO2: 23 mmol/L (ref 22–32)
Calcium: 9 mg/dL (ref 8.9–10.3)
Chloride: 105 mmol/L (ref 101–111)
Creatinine, Ser: 0.84 mg/dL (ref 0.44–1.00)
GFR calc non Af Amer: >60 mL/min (ref >60)
Glucose, Bld: 120 mg/dL — ABNORMAL HIGH (ref 65–99)
Potassium: 3.9 mmol/L (ref 3.5–5.1)
Sodium: 136 mmol/L (ref 135–145)
TOTAL PROTEIN: 7.6 g/dL (ref 6.5–8.1)
Total Bilirubin: 0.3 mg/dL (ref 0.3–1.2)

## 2015-03-04 LAB — CBC WITH DIFFERENTIAL/PLATELET
Basophils Absolute: 0.1 K/uL (ref 0–0.1)
Basophils Relative: 0 %
Eosinophils Absolute: 0.1 K/uL (ref 0–0.7)
Eosinophils Relative: 1 %
HCT: 38.8 % (ref 35.0–47.0)
Hemoglobin: 13 g/dL (ref 12.0–16.0)
Lymphocytes Relative: 20 %
Lymphs Abs: 2.7 K/uL (ref 1.0–3.6)
MCH: 29.2 pg (ref 26.0–34.0)
MCHC: 33.6 g/dL (ref 32.0–36.0)
MCV: 86.8 fL (ref 80.0–100.0)
Monocytes Absolute: 1 K/uL — ABNORMAL HIGH (ref 0.2–0.9)
Monocytes Relative: 7 %
Neutro Abs: 10 K/uL — ABNORMAL HIGH (ref 1.4–6.5)
Neutrophils Relative %: 72 %
Platelets: 246 K/uL (ref 150–440)
RBC: 4.47 MIL/uL (ref 3.80–5.20)
RDW: 13.3 % (ref 11.5–14.5)
WBC: 13.9 K/uL — ABNORMAL HIGH (ref 3.6–11.0)

## 2015-03-04 MED ORDER — ONDANSETRON HCL 4 MG/2ML IJ SOLN
4.0000 mg | Freq: Once | INTRAMUSCULAR | Status: AC
  Administered 2015-03-05: 4 mg via INTRAVENOUS

## 2015-03-04 MED ORDER — MORPHINE SULFATE 4 MG/ML IJ SOLN: 4.0000 mg | Freq: Once | INTRAMUSCULAR | Status: AC

## 2015-03-04 MED ORDER — SODIUM CHLORIDE 0.9 % IV SOLN
1000.0000 mL | Freq: Once | INTRAVENOUS | Status: AC
  Administered 2015-03-05: 1000 mL via INTRAVENOUS

## 2015-03-04 MED ORDER — IOHEXOL 240 MG/ML SOLN
50.0000 mL | INTRAMUSCULAR | Status: AC
  Administered 2015-03-04 – 2015-03-05 (×2): 50 mL via ORAL

## 2015-03-04 NOTE — ED Provider Notes (Signed)
[facility] Emergency Department Provider Note  ____________________________________________  Time seen: 11 PM  I have reviewed the triage vital signs and the nursing notes.   HISTORY  Chief Complaint No chief complaint on file.      HPI Kristi Gutierrez is a 34 y.o. female who presents with lower abdominal pain. She reports she had her tubes tied 3 days ago and the pain is not improved or gotten worse. She also notes vaginal bleeding which is light to moderate. She denies fevers but does report chills. She denies back pain. She denies dysuria. She reports constipation has not had a bowel movement since the procedure. She notes the pain is sharp and essentially suprapubic and constant. She called her gynecologist who recommended she come to the emergency department.She denies vaginal discharge     Past Medical History  Diagnosis Date  . Pseudotumor cerebri   . Migraine     Patient Active Problem List   Diagnosis Date Noted  . Admission for sterilization 03/01/2015    Past Surgical History  Procedure Laterality Date  . Cyst excision    . Attempted btl-had to abort procedure  10-2014  . Mouth surgery    . Laparoscopic tubal ligation Bilateral 03/01/2015    Procedure: LAPAROSCOPIC TUBAL LIGATION;  Surgeon: Will Bonnet, MD;  Location: [facility] ORS;  Service: Gynecology;  Laterality: Bilateral;    Current Outpatient Rx  Name  Route  Sig  Dispense  Refill  . HYDROcodone-acetaminophen (NORCO) 5-325 MG per tablet   Oral   Take 2 tablets by mouth every 6 (six) hours as needed for moderate pain.   30 tablet   0   . ibuprofen (ADVIL,MOTRIN) 600 MG tablet   Oral   Take 1 tablet (600 mg total) by mouth every 6 (six) hours as needed for mild pain or cramping.   30 tablet   0     Allergies Review of patient's allergies indicates no known allergies.  No family history on file.  Social History History  Substance Use Topics  . Smoking status:  Never Smoker   . Smokeless tobacco: Not on file  . Alcohol Use: Yes     Comment: occ    Review of Systems  Constitutional: Negative for fever. Eyes: Negative for visual changes. ENT: Negative for sore throat Cardiovascular: Negative for chest pain. Respiratory: Negative for shortness of breath. Gastrointestinal: Positive for abdominal pain and nausea Genitourinary: Negative for dysuria. Positive for vaginal bleeding Musculoskeletal: Negative for back pain. Skin: Negative for rash. Neurological: Negative for headaches or focal weakness   10-point ROS otherwise negative.  ____________________________________________   PHYSICAL EXAM:  VITAL SIGNS: ED Triage Vitals  Enc Vitals Group     BP 03/04/15 1934 143/78 mmHg     Pulse Rate 03/04/15 1934 94     Resp 03/04/15 1934 17     Temp 03/04/15 1934 98.3 F (36.8 C)     Temp Source 03/04/15 1934 Oral     SpO2 03/04/15 1934 98 %     Weight 03/04/15 1934 273 lb (123.832 kg)     Height 03/04/15 1934 5\' 6"  (1.676 m)     Head Cir --      Peak Flow --      Pain Score 03/04/15 1935 10     Pain Loc --      Pain Edu? --      Excl. in [place]? --      Constitutional: Alert and oriented. Well appearing  and anxious Eyes: Conjunctivae are normal. PERRL. ENT   Head: Normocephalic and atraumatic.   Nose: No rhinnorhea.   Mouth/Throat: Mucous membranes are moist. Cardiovascular: Normal rate, regular rhythm. Normal and symmetric distal pulses are present in all extremities. No murmurs, rubs, or gallops. Respiratory: Normal respiratory effort without tachypnea nor retractions. Breath sounds are clear and equal bilaterally.  Gastrointestinal: Patient with tenderness suprapubically. No distention. There is no CVA tenderness. Genitourinary: Pelvic exam demonstrated small amount of bleeding from os, no discharge noted, no uterine tenderness Musculoskeletal: Nontender with normal range of motion in all extremities. No lower extremity  tenderness nor edema. Neurologic:  Normal speech and language. No gross focal neurologic deficits are appreciated. Skin:  Skin is warm, dry and intact. No rash noted. Psychiatric: Mood and affect are normal. Patient exhibits appropriate insight and judgment.  ____________________________________________    LABS (pertinent positives/negatives)  Labs Reviewed  CBC WITH DIFFERENTIAL/PLATELET - Abnormal; Notable for the following:    WBC 13.9 (*)    Neutro Abs 10.0 (*)    Monocytes Absolute 1.0 (*)    All other components within normal limits  COMPREHENSIVE METABOLIC PANEL - Abnormal; Notable for the following:    Glucose, Bld 120 (*)    All other components within normal limits  URINALYSIS COMPLETEWITH MICROSCOPIC ([facility] ONLY)    ____________________________________________   EKG  None  ____________________________________________    RADIOLOGY  CT scan unremarkable  ____________________________________________   PROCEDURES  Procedure(s) performed: none  Critical Care performed: none  ____________________________________________   INITIAL IMPRESSION / ASSESSMENT AND PLAN / ED COURSE  Pertinent labs & imaging results that were available during my care of the patient were reviewed by me and considered in my medical decision making (see chart for details).  Patient status post gynecologic procedure 3 days ago now with worsening pain. Concern is primarily for infection although patient also with significant constipation. UTI is also a possibility. Vitals are reassuring. We will give normal saline IV, pain medications, Zofran IV. We will need imaging likely a CT scan   ____________________________________________ ----------------------------------------- 2:09 AM on 03/05/2015 -----------------------------------------  CT scan reassuring. Mild elevation in white blood cell count, urinalysis consistent with possible urinary tract infection. Patient has been resting  comfortably in emergency department  ----------------------------------------- 5:01 AM on 03/05/2015 -----------------------------------------  Ultrasound pelvis also normal. I will discharge the patient with antibiotics for UTI. She will follow with Dr. Glennon Mac in 2 days. She is to return to the emergency department if she develops fevers chills nausea vomiting or worsening pain  FINAL CLINICAL IMPRESSION(S) / ED DIAGNOSES  Final diagnoses:  Abdominal pain  UTI (lower urinary tract infection)     Lavonia Drafts, MD 03/05/15 0502

## 2015-03-04 NOTE — ED Notes (Signed)
Pt states she had her tubes tied on Tuesday, pt states since that time she has worsening abd pain. Pt states she has a lot of vag bleeding. Pt called her obgyn and was told to come to the ed,.

## 2015-03-05 ENCOUNTER — Emergency Department: Payer: Medicaid - Out of State

## 2015-03-05 ENCOUNTER — Encounter: Payer: Self-pay | Admitting: Emergency Medicine

## 2015-03-05 MED ORDER — KETOROLAC TROMETHAMINE 30 MG/ML IJ SOLN
30.0000 mg | Freq: Once | INTRAMUSCULAR | Status: AC
  Administered 2015-03-05: 30 mg via INTRAVENOUS

## 2015-03-05 MED ORDER — KETOROLAC TROMETHAMINE 30 MG/ML IJ SOLN
INTRAMUSCULAR | Status: AC
  Administered 2015-03-05: 30 mg via INTRAVENOUS
  Filled 2015-03-05: qty 1

## 2015-03-05 MED ORDER — NITROFURANTOIN MONOHYD MACRO 100 MG PO CAPS: 100.0000 mg | ORAL_CAPSULE | Freq: Two times a day (BID) | ORAL | Status: AC

## 2015-03-05 MED ORDER — MORPHINE SULFATE 2 MG/ML IJ SOLN
INTRAMUSCULAR | Status: AC
  Administered 2015-03-05: 4 mg via INTRAVENOUS
  Filled 2015-03-05: qty 2

## 2015-03-05 MED ORDER — IOHEXOL 300 MG/ML  SOLN
125.0000 mL | Freq: Once | INTRAMUSCULAR | Status: AC | PRN
  Administered 2015-03-05: 125 mL via INTRAVENOUS

## 2015-03-05 MED ORDER — ONDANSETRON HCL 4 MG/2ML IJ SOLN
INTRAMUSCULAR | Status: AC
  Administered 2015-03-05: 4 mg via INTRAVENOUS
  Filled 2015-03-05: qty 2

## 2015-03-05 NOTE — Discharge Instructions (Signed)

## 2015-03-05 NOTE — ED Notes (Signed)
Patient transported to Ultrasound 

## 2015-03-05 NOTE — ED Notes (Signed)
Pt tolerating oral contrast

## 2015-03-05 NOTE — ED Notes (Signed)
Pt ambulatory to restroom without difficulty. Pt provided with feminine pad.

## 2015-03-22 ENCOUNTER — Encounter (HOSPITAL_COMMUNITY): Payer: Self-pay | Admitting: Emergency Medicine

## 2015-03-22 DIAGNOSIS — E669 Obesity, unspecified: Secondary | ICD-10-CM | POA: Insufficient documentation

## 2015-03-22 DIAGNOSIS — N898 Other specified noninflammatory disorders of vagina: Secondary | ICD-10-CM | POA: Insufficient documentation

## 2015-03-22 DIAGNOSIS — R51 Headache: Secondary | ICD-10-CM | POA: Diagnosis not present

## 2015-03-22 DIAGNOSIS — Z8679 Personal history of other diseases of the circulatory system: Secondary | ICD-10-CM | POA: Insufficient documentation

## 2015-03-22 DIAGNOSIS — R1030 Lower abdominal pain, unspecified: Secondary | ICD-10-CM | POA: Diagnosis present

## 2015-03-22 DIAGNOSIS — G8918 Other acute postprocedural pain: Secondary | ICD-10-CM | POA: Diagnosis not present

## 2015-03-22 DIAGNOSIS — Z3202 Encounter for pregnancy test, result negative: Secondary | ICD-10-CM | POA: Insufficient documentation

## 2015-03-22 LAB — COMPREHENSIVE METABOLIC PANEL
ALK PHOS: 99 U/L (ref 38–126)
ALT: 48 U/L (ref 14–54)
AST: 27 U/L (ref 15–41)
Albumin: 3.8 g/dL (ref 3.5–5.0)
Anion gap: 9 (ref 5–15)
BUN: 15 mg/dL (ref 6–20)
CHLORIDE: 105 mmol/L (ref 101–111)
CO2: 25 mmol/L (ref 22–32)
Calcium: 9.5 mg/dL (ref 8.9–10.3)
Creatinine, Ser: 0.87 mg/dL (ref 0.44–1.00)
GFR calc non Af Amer: >60 mL/min (ref >60)
GLUCOSE: 107 mg/dL — AB (ref 65–99)
Potassium: 3.6 mmol/L (ref 3.5–5.1)
Sodium: 139 mmol/L (ref 135–145)
Total Bilirubin: 0.2 mg/dL — ABNORMAL LOW (ref 0.3–1.2)
Total Protein: 7.7 g/dL (ref 6.5–8.1)

## 2015-03-22 NOTE — ED Notes (Signed)
Pt. reports low abdominal pain with nausea , chills and headache onset this week , denies emesis or diarrhea.

## 2015-03-23 ENCOUNTER — Emergency Department (HOSPITAL_COMMUNITY)
Admission: EM | Admit: 2015-03-23 | Discharge: 2015-03-23 | Disposition: A | Discharge status: Home / Self Care | Payer: Medicaid Other | Attending: Emergency Medicine | Admitting: Emergency Medicine

## 2015-03-23 ENCOUNTER — Emergency Department (HOSPITAL_COMMUNITY): Payer: Medicaid Other

## 2015-03-23 ENCOUNTER — Encounter (HOSPITAL_COMMUNITY): Payer: Self-pay

## 2015-03-23 DIAGNOSIS — R1030 Lower abdominal pain, unspecified: Secondary | ICD-10-CM

## 2015-03-23 DIAGNOSIS — G8918 Other acute postprocedural pain: Secondary | ICD-10-CM

## 2015-03-23 LAB — WET PREP, GENITAL
Clue Cells Wet Prep HPF POC: NONE SEEN
TRICH WET PREP: NONE SEEN
Yeast Wet Prep HPF POC: NONE SEEN

## 2015-03-23 LAB — URINALYSIS, ROUTINE W REFLEX MICROSCOPIC
BILIRUBIN URINE: NEGATIVE
Glucose, UA: NEGATIVE mg/dL
HGB URINE DIPSTICK: NEGATIVE
Ketones, ur: NEGATIVE mg/dL
Leukocytes, UA: NEGATIVE
Nitrite: NEGATIVE
Protein, ur: NEGATIVE mg/dL
Specific Gravity, Urine: 1.027 (ref 1.005–1.030)
Urobilinogen, UA: 0.2 mg/dL (ref 0.0–1.0)
pH: 6 (ref 5.0–8.0)

## 2015-03-23 LAB — CBC WITH DIFFERENTIAL/PLATELET
BASOS PCT: 0 % (ref 0–1)
Basophils Absolute: 0 10*3/uL (ref 0.0–0.1)
EOS ABS: 0.2 10*3/uL (ref 0.0–0.7)
EOS PCT: 2 % (ref 0–5)
HEMATOCRIT: 37.1 % (ref 36.0–46.0)
Hemoglobin: 12.4 g/dL (ref 12.0–15.0)
LYMPHS ABS: 2.9 10*3/uL (ref 0.7–4.0)
Lymphocytes Relative: 30 % (ref 12–46)
MCH: 28.2 pg (ref 26.0–34.0)
MCHC: 33.4 g/dL (ref 30.0–36.0)
MCV: 84.5 fL (ref 78.0–100.0)
Monocytes Absolute: 0.8 10*3/uL (ref 0.1–1.0)
Monocytes Relative: 8 % (ref 3–12)
NEUTROS ABS: 5.6 10*3/uL (ref 1.7–7.7)
Neutrophils Relative %: 60 % (ref 43–77)
PLATELETS: 260 10*3/uL (ref 150–400)
RBC: 4.39 MIL/uL (ref 3.87–5.11)
RDW: 12.7 % (ref 11.5–15.5)
WBC: 9.5 10*3/uL (ref 4.0–10.5)

## 2015-03-23 LAB — GC/CHLAMYDIA PROBE AMP (~~LOC~~) NOT AT ARMC
Chlamydia: NEGATIVE
Neisseria Gonorrhea: NEGATIVE

## 2015-03-23 LAB — POC URINE PREG, ED: Preg Test, Ur: NEGATIVE

## 2015-03-23 MED ORDER — IOHEXOL 300 MG/ML  SOLN
100.0000 mL | Freq: Once | INTRAMUSCULAR | Status: AC | PRN
  Administered 2015-03-23: 100 mL via INTRAVENOUS

## 2015-03-23 MED ORDER — ONDANSETRON HCL 4 MG/2ML IJ SOLN
4.0000 mg | Freq: Once | INTRAMUSCULAR | Status: AC
  Administered 2015-03-23: 4 mg via INTRAVENOUS
  Filled 2015-03-23: qty 2

## 2015-03-23 MED ORDER — HYDROMORPHONE HCL 1 MG/ML IJ SOLN
1.0000 mg | Freq: Once | INTRAMUSCULAR | Status: AC
  Administered 2015-03-23: 1 mg via INTRAVENOUS
  Filled 2015-03-23: qty 1

## 2015-03-23 MED ORDER — OXYCODONE-ACETAMINOPHEN 5-325 MG PO TABS: 1.0000 | ORAL_TABLET | Freq: Four times a day (QID) | ORAL | Status: DC | PRN

## 2015-03-23 MED ORDER — MORPHINE SULFATE 4 MG/ML IJ SOLN
4.0000 mg | Freq: Once | INTRAMUSCULAR | Status: AC
  Administered 2015-03-23: 4 mg via INTRAVENOUS
  Filled 2015-03-23: qty 1

## 2015-03-23 MED ORDER — DOXYCYCLINE HYCLATE 100 MG PO CAPS: 100.0000 mg | ORAL_CAPSULE | Freq: Two times a day (BID) | ORAL | Status: DC

## 2015-03-23 MED ORDER — SODIUM CHLORIDE 0.9 % IV BOLUS (SEPSIS)
1000.0000 mL | Freq: Once | INTRAVENOUS | Status: AC
  Administered 2015-03-23: 1000 mL via INTRAVENOUS

## 2015-03-23 NOTE — Discharge Instructions (Signed)
You were seen today for abdominal pain. The source of your pain is not clear. Your CT scan is reassuring. You do have some drainage from her surgical site. You will be placed on antibiotics for early infection. You need to follow-up with your primary OB for recheck.  Abdominal Pain, Women Abdominal (stomach, pelvic, or belly) pain can be caused by many things. It is important to tell your doctor:  The location of the pain.  Does it come and go or is it present all the time?  Are there things that start the pain (eating certain foods, exercise)?  Are there other symptoms associated with the pain (fever, nausea, vomiting, diarrhea)? All of this is helpful to know when trying to find the cause of the pain. CAUSES   Stomach: virus or bacteria infection, or ulcer.  Intestine: appendicitis (inflamed appendix), regional ileitis (Crohn's disease), ulcerative colitis (inflamed colon), irritable bowel syndrome, diverticulitis (inflamed diverticulum of the colon), or cancer of the stomach or intestine.  Gallbladder disease or stones in the gallbladder.  Kidney disease, kidney stones, or infection.  Pancreas infection or cancer.  Fibromyalgia (pain disorder).  Diseases of the female organs:  Uterus: fibroid (non-cancerous) tumors or infection.  Fallopian tubes: infection or tubal pregnancy.  Ovary: cysts or tumors.  Pelvic adhesions (scar tissue).  Endometriosis (uterus lining tissue growing in the pelvis and on the pelvic organs).  Pelvic congestion syndrome (female organs filling up with blood just before the menstrual period).  Pain with the menstrual period.  Pain with ovulation (producing an egg).  Pain with an IUD (intrauterine device, birth control) in the uterus.  Cancer of the female organs.  Functional pain (pain not caused by a disease, may improve without treatment).  Psychological pain.  Depression. DIAGNOSIS  Your doctor will decide the seriousness of your  pain by doing an examination.  Blood tests.  X-rays.  Ultrasound.  CT scan (computed tomography, special type of X-ray).  MRI (magnetic resonance imaging).  Cultures, for infection.  Barium enema (dye inserted in the large intestine, to better view it with X-rays).  Colonoscopy (looking in intestine with a lighted tube).  Laparoscopy (minor surgery, looking in abdomen with a lighted tube).  Major abdominal exploratory surgery (looking in abdomen with a large incision). TREATMENT  The treatment will depend on the cause of the pain.   Many cases can be observed and treated at home.  Over-the-counter medicines recommended by your caregiver.  Prescription medicine.  Antibiotics, for infection.  Birth control pills, for painful periods or for ovulation pain.  Hormone treatment, for endometriosis.  Nerve blocking injections.  Physical therapy.  Antidepressants.  Counseling with a psychologist or psychiatrist.  Minor or major surgery. HOME CARE INSTRUCTIONS   Do not take laxatives, unless directed by your caregiver.  Take over-the-counter pain medicine only if ordered by your caregiver. Do not take aspirin because it can cause an upset stomach or bleeding.  Try a clear liquid diet (broth or water) as ordered by your caregiver. Slowly move to a bland diet, as tolerated, if the pain is related to the stomach or intestine.  Have a thermometer and take your temperature several times a day, and record it.  Bed rest and sleep, if it helps the pain.  Avoid sexual intercourse, if it causes pain.  Avoid stressful situations.  Keep your follow-up appointments and tests, as your caregiver orders.  If the pain does not go away with medicine or surgery, you may try:  Acupuncture.  Relaxation exercises (yoga, meditation).  Group therapy.  Counseling. SEEK MEDICAL CARE IF:   You notice certain foods cause stomach pain.  Your home care treatment is not helping  your pain.  You need stronger pain medicine.  You want your IUD removed.  You feel faint or lightheaded.  You develop nausea and vomiting.  You develop a rash.  You are having side effects or an allergy to your medicine. SEEK IMMEDIATE MEDICAL CARE IF:   Your pain does not go away or gets worse.  You have a fever.  Your pain is felt only in portions of the abdomen. The right side could possibly be appendicitis. The left lower portion of the abdomen could be colitis or diverticulitis.  You are passing blood in your stools (bright red or black tarry stools, with or without vomiting).  You have blood in your urine.  You develop chills, with or without a fever.  You pass out. MAKE SURE YOU:   Understand these instructions.  Will watch your condition.  Will get help right away if you are not doing well or get worse. Document Released: 07/08/2007 Document Revised: 01/25/2014 Document Reviewed: 07/28/2009 [facility] Patient Information 2015 [place], [place]. This information is not intended to replace advice given to you by your health care provider. Make sure you discuss any questions you have with your health care provider.

## 2015-03-23 NOTE — ED Provider Notes (Signed)
CSN: [id number]     Arrival date & time 03/22/15  2219 History  This chart was scribed for Kristi Hacker, MD by Rayfield Citizen, ED Scribe. This patient was seen in room D31C/D31C and the patient's care was started at 2:17 AM.    Chief Complaint  Patient presents with  . Abdominal Pain   The history is provided by the patient. No language interpreter was used.     HPI Comments: Kristi Gutierrez is a 34 y.o. female who presents to the Emergency Department complaining of 2 days of suprapubic abdominal pain, rated 10/10, with nausea and vomiting. She also notes headache, chills. Patient explains she has some "oozing" from her tubal ligation site, which concerns her, states, "My insides hurt." She denies fever, dysuria, hematuria. Reports vaginal discharge.  No new sexual partners.  Patient gave birth two months PTA; tubal ligation on 03/01/15 by Dr. Glennon Mac.   Past Medical History  Diagnosis Date  . Pseudotumor cerebri   . Migraine    Past Surgical History  Procedure Laterality Date  . Cyst excision    . Attempted btl-had to abort procedure  10-2014  . Mouth surgery    . Laparoscopic tubal ligation Bilateral 03/01/2015    Procedure: LAPAROSCOPIC TUBAL LIGATION;  Surgeon: Will Bonnet, MD;  Location: [facility] ORS;  Service: Gynecology;  Laterality: Bilateral;   No family history on file. History  Substance Use Topics  . Smoking status: Never Smoker   . Smokeless tobacco: Not on file  . Alcohol Use: Yes     Comment: occ   OB History    No data available     Review of Systems  Constitutional: Negative for fever.  Respiratory: Negative for chest tightness and shortness of breath.   Cardiovascular: Negative for chest pain.  Gastrointestinal: Positive for abdominal pain. Negative for nausea and vomiting.  Genitourinary: Positive for vaginal discharge. Negative for dysuria and vaginal pain.  Musculoskeletal: Negative for back pain.  Skin: Negative for wound.  Neurological: Positive  for headaches.  Psychiatric/Behavioral: Negative for confusion.  All other systems reviewed and are negative.     Allergies  Review of patient's allergies indicates no known allergies.  Home Medications   Prior to Admission medications   Medication Sig Start Date End Date Taking? Authorizing Provider  doxycycline (VIBRAMYCIN) 100 MG capsule Take 1 capsule (100 mg total) by mouth 2 (two) times daily. 03/23/15   Kristi Hacker, MD  oxyCODONE-acetaminophen (PERCOCET/ROXICET) 5-325 MG per tablet Take 1-2 tablets by mouth every 6 (six) hours as needed for severe pain. 03/23/15   Kristi Hacker, MD   BP 111/65 mmHg  Pulse 73  Temp(Src) 98.2 F (36.8 C) (Oral)  Resp 18  SpO2 99%  LMP 02/13/2015 (Approximate) Physical Exam  Constitutional: She is oriented to person, place, and time. She appears well-developed and well-nourished.  obese  HENT:  Head: Normocephalic and atraumatic.  Eyes: Pupils are equal, round, and reactive to light.  Cardiovascular: Normal rate, regular rhythm and normal heart sounds.   Pulmonary/Chest: Effort normal. No respiratory distress. She has no wheezes.  Abdominal: Soft. Bowel sounds are normal. There is tenderness. There is no rebound and no guarding.  Diffuse tenderness to palpation without rebound or guarding, 1 cm laparoscopic incision suprapubic midline with serosanguineous drainage, no adjacent erythema or fluctuance  Genitourinary: Vagina normal.  Moderate white vaginal discharge  Neurological: She is alert and oriented to person, place, and time.  Skin: Skin is warm and dry.  Psychiatric: She has a normal mood and affect.  Nursing note and vitals reviewed.   ED Course  Procedures   DIAGNOSTIC STUDIES: Oxygen Saturation is 97% on RA, adequate by my interpretation.    COORDINATION OF CARE: 2:22 AM Discussed treatment plan with pt at bedside and pt agreed to plan.   Labs Review Labs Reviewed  WET PREP, GENITAL - Abnormal; Notable for  the following:    WBC, Wet Prep HPF POC FEW (*)    All other components within normal limits  COMPREHENSIVE METABOLIC PANEL - Abnormal; Notable for the following:    Glucose, Bld 107 (*)    Total Bilirubin 0.2 (*)    All other components within normal limits  CBC WITH DIFFERENTIAL/PLATELET  URINALYSIS, ROUTINE W REFLEX MICROSCOPIC (NOT AT [facility])  POC URINE PREG, ED  GC/CHLAMYDIA PROBE AMP ([place]) NOT AT [facility]    Imaging Review Ct Abdomen Pelvis W Contrast  03/23/2015   CLINICAL DATA:  Severe suprapubic abdominal pain for 2 days. Nausea and vomiting.  EXAM: CT ABDOMEN AND PELVIS WITH CONTRAST  TECHNIQUE: Multidetector CT imaging of the abdomen and pelvis was performed using the standard protocol following bolus administration of intravenous contrast.  CONTRAST:  152mL OMNIPAQUE IOHEXOL 300 MG/ML  SOLN  COMPARISON:  Ultrasound 03/05/2015  FINDINGS: Lower chest: No significant abnormality. Minimal linear scarring or atelectasis in the right lateral base.  Hepatobiliary: There are normal appearances of the liver, gallbladder and bile ducts.  Pancreas: Normal  Spleen: Normal  Adrenals/Urinary Tract: The adrenals and kidneys are normal in appearance. There is no urinary calculus evident. There is no hydronephrosis or ureteral dilatation. Collecting systems and ureters appear unremarkable.  Stomach/Bowel: The stomach, small bowel and colon appear unremarkable.  Vascular/Lymphatic: The abdominal aorta is normal in caliber. There is no atherosclerotic calcification. There is no adenopathy in the abdomen or pelvis.  Reproductive: The uterus and ovaries appear unremarkable. Tubal ligation clips noted.  Other: There is a mild amount of stranding along the low midline incision. No drainable collection is evident.  Musculoskeletal: No significant abnormality.  IMPRESSION: No acute findings. Mild stranding along the low midline abdominal incision. No abscess or drainable fluid collection.   Electronically Signed    By: Andreas Newport M.D.   On: 03/23/2015 05:19     EKG Interpretation None      MDM   Final diagnoses:  Lower abdominal pain  Pain at surgical site    Patient presents with generalized abdominal pain, vaginal discharge, and drainage from one of her laparoscopic incisions. Nontoxic on exam. Vital signs reassuring. She is tender but without signs of peritonitis on exam. There is some serosanguineous drainage from one of her laparoscopic sites without adjacent signs or symptoms of infection.  Lab work is largely reassuring. Patient was given pain and nausea medication. On recheck, patient continuing to endorse pain refractory to pain medication. Given recent surgery will obtain a CT scan. CT without any drainable abscess or fluid collection. There is mild stranding along the lower midline abdominal wall incision. This may reflect early infection. Will discharge with antibiotics and OB follow-up. Patient reports no new sexual partners.  She was tested for STDs but not treated.  After history, exam, and medical workup I feel the patient has been appropriately medically screened and is safe for discharge home. Pertinent diagnoses were discussed with the patient. Patient was given return precautions.  I personally performed the services described in this documentation, which was scribed in my presence.  The recorded information has been reviewed and is accurate.      Kristi Hacker, MD 03/23/15 [phone]

## 2015-03-23 NOTE — ED Notes (Signed)
Pt stable, ambulatory, states understanding of discharge instructions 

## 2015-04-06 ENCOUNTER — Encounter: Payer: Self-pay | Admitting: *Deleted

## 2015-04-06 DIAGNOSIS — Z792 Long term (current) use of antibiotics: Secondary | ICD-10-CM | POA: Insufficient documentation

## 2015-04-06 DIAGNOSIS — Y838 Other surgical procedures as the cause of abnormal reaction of the patient, or of later complication, without mention of misadventure at the time of the procedure: Secondary | ICD-10-CM | POA: Insufficient documentation

## 2015-04-06 DIAGNOSIS — N390 Urinary tract infection, site not specified: Secondary | ICD-10-CM | POA: Insufficient documentation

## 2015-04-06 DIAGNOSIS — R109 Unspecified abdominal pain: Secondary | ICD-10-CM | POA: Diagnosis present

## 2015-04-06 DIAGNOSIS — N9989 Other postprocedural complications and disorders of genitourinary system: Secondary | ICD-10-CM | POA: Insufficient documentation

## 2015-04-06 NOTE — ED Notes (Signed)
Pt unable to void at this time. 

## 2015-04-06 NOTE — ED Notes (Signed)
Pt had tubal ligation March 02 2015.  Done by dr Glennon Mac.  Incision is draining and bleeding.  Pt has low abd pain.

## 2015-04-07 ENCOUNTER — Emergency Department: Payer: Medicaid Other

## 2015-04-07 ENCOUNTER — Emergency Department
Admission: EM | Admit: 2015-04-07 | Discharge: 2015-04-07 | Disposition: A | Discharge status: Home / Self Care | Payer: Medicaid Other | Attending: Emergency Medicine | Admitting: Emergency Medicine

## 2015-04-07 DIAGNOSIS — N390 Urinary tract infection, site not specified: Secondary | ICD-10-CM

## 2015-04-07 DIAGNOSIS — T8189XA Other complications of procedures, not elsewhere classified, initial encounter: Secondary | ICD-10-CM

## 2015-04-07 LAB — COMPREHENSIVE METABOLIC PANEL
ALK PHOS: 87 U/L (ref 38–126)
ALT: 51 U/L (ref 14–54)
AST: 33 U/L (ref 15–41)
Albumin: 4 g/dL (ref 3.5–5.0)
Anion gap: 6 (ref 5–15)
BUN: 16 mg/dL (ref 6–20)
CALCIUM: 9.4 mg/dL (ref 8.9–10.3)
CO2: 24 mmol/L (ref 22–32)
Chloride: 108 mmol/L (ref 101–111)
Creatinine, Ser: 0.77 mg/dL (ref 0.44–1.00)
Glucose, Bld: 110 mg/dL — ABNORMAL HIGH (ref 65–99)
Potassium: 3.6 mmol/L (ref 3.5–5.1)
Sodium: 138 mmol/L (ref 135–145)
Total Bilirubin: 0.3 mg/dL (ref 0.3–1.2)
Total Protein: 7.7 g/dL (ref 6.5–8.1)

## 2015-04-07 LAB — CBC WITH DIFFERENTIAL/PLATELET
BASOS PCT: 0 %
Basophils Absolute: 0 10*3/uL (ref 0–0.1)
EOS PCT: 1 %
Eosinophils Absolute: 0.1 10*3/uL (ref 0–0.7)
HCT: 37 % (ref 35.0–47.0)
Hemoglobin: 12.5 g/dL (ref 12.0–16.0)
Lymphocytes Relative: 25 %
Lymphs Abs: 2.9 10*3/uL (ref 1.0–3.6)
MCH: 29 pg (ref 26.0–34.0)
MCHC: 33.8 g/dL (ref 32.0–36.0)
MCV: 85.8 fL (ref 80.0–100.0)
MONOS PCT: 10 %
Monocytes Absolute: 1.1 10*3/uL — ABNORMAL HIGH (ref 0.2–0.9)
NEUTROS PCT: 64 %
Neutro Abs: 7.4 10*3/uL — ABNORMAL HIGH (ref 1.4–6.5)
Platelets: 215 10*3/uL (ref 150–440)
RBC: 4.31 MIL/uL (ref 3.80–5.20)
RDW: 13.6 % (ref 11.5–14.5)
WBC: 11.6 10*3/uL — AB (ref 3.6–11.0)

## 2015-04-07 LAB — URINALYSIS COMPLETE WITH MICROSCOPIC (ARMC ONLY)
BILIRUBIN URINE: NEGATIVE
Glucose, UA: NEGATIVE mg/dL
Hgb urine dipstick: NEGATIVE
Ketones, ur: NEGATIVE mg/dL
NITRITE: NEGATIVE
PROTEIN: NEGATIVE mg/dL
Specific Gravity, Urine: 1.03 (ref 1.005–1.030)
pH: 5 (ref 5.0–8.0)

## 2015-04-07 MED ORDER — ONDANSETRON HCL 4 MG/2ML IJ SOLN
4.0000 mg | Freq: Once | INTRAMUSCULAR | Status: AC
  Administered 2015-04-07: 4 mg via INTRAVENOUS
  Filled 2015-04-07: qty 2

## 2015-04-07 MED ORDER — PHENAZOPYRIDINE HCL 95 MG PO TABS: 190.0000 mg | ORAL_TABLET | Freq: Three times a day (TID) | ORAL | Status: DC | PRN

## 2015-04-07 MED ORDER — CEPHALEXIN 500 MG PO CAPS: 500.0000 mg | ORAL_CAPSULE | Freq: Two times a day (BID) | ORAL | Status: DC

## 2015-04-07 MED ORDER — HYDROMORPHONE HCL 1 MG/ML IJ SOLN
INTRAMUSCULAR | Status: AC
  Administered 2015-04-07: 1 mg via INTRAVENOUS
  Filled 2015-04-07: qty 1

## 2015-04-07 MED ORDER — IOHEXOL 300 MG/ML  SOLN
100.0000 mL | Freq: Once | INTRAMUSCULAR | Status: AC | PRN
  Administered 2015-04-07: 100 mL via INTRAVENOUS

## 2015-04-07 MED ORDER — OXYCODONE-ACETAMINOPHEN 5-325 MG PO TABS
2.0000 | ORAL_TABLET | Freq: Once | ORAL | Status: AC
  Administered 2015-04-07: 2 via ORAL
  Filled 2015-04-07: qty 2

## 2015-04-07 MED ORDER — IOHEXOL 240 MG/ML SOLN
50.0000 mL | INTRAMUSCULAR | Status: AC
  Administered 2015-04-07 (×2): 25 mL via ORAL

## 2015-04-07 MED ORDER — ONDANSETRON HCL 4 MG PO TABS: ORAL_TABLET | ORAL | Status: DC

## 2015-04-07 MED ORDER — CEPHALEXIN 500 MG PO CAPS: 500.0000 mg | ORAL_CAPSULE | Freq: Three times a day (TID) | ORAL | Status: DC

## 2015-04-07 MED ORDER — HYDROMORPHONE HCL 1 MG/ML IJ SOLN
1.0000 mg | Freq: Once | INTRAMUSCULAR | Status: AC
Start: 2015-04-07 — End: 2015-04-07
  Administered 2015-04-07: 1 mg via INTRAVENOUS

## 2015-04-07 MED ORDER — CEFTRIAXONE SODIUM IN DEXTROSE 20 MG/ML IV SOLN
1.0000 g | Freq: Once | INTRAVENOUS | Status: AC
  Administered 2015-04-07: 1 g via INTRAVENOUS
  Filled 2015-04-07: qty 50

## 2015-04-07 MED ORDER — SODIUM CHLORIDE 0.9 % IV BOLUS (SEPSIS)
1000.0000 mL | Freq: Once | INTRAVENOUS | Status: AC
  Administered 2015-04-07: 1000 mL via INTRAVENOUS

## 2015-04-07 MED ORDER — HYDROMORPHONE HCL 1 MG/ML IJ SOLN
1.0000 mg | Freq: Once | INTRAMUSCULAR | Status: AC
  Administered 2015-04-07: 1 mg via INTRAVENOUS
  Filled 2015-04-07: qty 1

## 2015-04-07 NOTE — ED Notes (Signed)
Pt states abdominal pain is 10/10 intensity, but is actively texting on her cell phone during this RN's initial assessment.

## 2015-04-07 NOTE — ED Provider Notes (Signed)
[facility] Emergency Department Provider Note   ____________________________________________  Time seen: 4:30 AM I have reviewed the triage vital signs and the triage nursing note.  HISTORY  Chief Complaint Abdominal Pain   Historian Patient  HPI Kristi Gutierrez is a 34 y.o. female who is presenting with lower abdominal pain which is severe and associated with vomiting a few times today. She's not had a fever. She had a tubal ligation through her low midline incision early in June and had no pain after the surgery, but she did have a nonhealing wound. Her OB/GYN does not know about this nonhealing wound. The fluid drainage was whitish per the patient and then over the last 2 days it seemed to become foul smelling and yellow. She's not had any diarrhea. She's having no specific dysuria, or frequency of urination. No back pain or flank pain.    Past Medical History  Diagnosis Date  . Pseudotumor cerebri   . Migraine     Patient Active Problem List   Diagnosis Date Noted  . Admission for sterilization 03/01/2015    Past Surgical History  Procedure Laterality Date  . Cyst excision    . Attempted btl-had to abort procedure  10-2014  . Mouth surgery    . Laparoscopic tubal ligation Bilateral 03/01/2015    Procedure: LAPAROSCOPIC TUBAL LIGATION;  Surgeon: Will Bonnet, MD;  Location: [facility] ORS;  Service: Gynecology;  Laterality: Bilateral;    Current Outpatient Rx  Name  Route  Sig  Dispense  Refill  . doxycycline (VIBRAMYCIN) 100 MG capsule   Oral   Take 1 capsule (100 mg total) by mouth 2 (two) times daily.   20 capsule   0   . oxyCODONE-acetaminophen (PERCOCET/ROXICET) 5-325 MG per tablet   Oral   Take 1-2 tablets by mouth every 6 (six) hours as needed for severe pain.   12 tablet   0     Allergies Review of patient's allergies indicates no known allergies.  No family history on file.  Social History History  Substance Use Topics  .  Smoking status: Never Smoker   . Smokeless tobacco: Not on file  . Alcohol Use: No     Comment: occ    Review of Systems  Constitutional: Negative for fever. Eyes: Negative for visual changes. ENT: Negative for sore throat. Cardiovascular: Negative for chest pain. Respiratory: Negative for shortness of breath. Gastrointestinal: Negative for black or bloody stools. Genitourinary: Negative for dysuria. Musculoskeletal: Negative for back pain. Skin: Negative for rash. Neurological: Negative for headaches, focal weakness or numbness. 10 point Review of Systems otherwise negative ____________________________________________   PHYSICAL EXAM:  VITAL SIGNS: ED Triage Vitals  Enc Vitals Group     BP 04/06/15 2311 117/64 mmHg     Pulse Rate 04/06/15 2311 82     Resp 04/06/15 2311 20     Temp 04/06/15 2311 98.1 F (36.7 C)     Temp Source 04/06/15 2311 Oral     SpO2 04/06/15 2311 96 %     Weight 04/06/15 2311 263 lb (119.296 kg)     Height 04/06/15 2311 5\' 6"  (1.676 m)     Head Cir --      Peak Flow --      Pain Score 04/06/15 2313 9     Pain Loc --      Pain Edu? --      Excl. in [place]? --      Constitutional: Alert and oriented. Well  appearing and in no distress. Eyes: Conjunctivae are normal. PERRL. Normal extraocular movements. ENT   Head: Normocephalic and atraumatic.   Nose: No congestion/rhinnorhea.   Mouth/Throat: Mucous membranes are moist.   Neck: No stridor. Cardiovascular/Chest: Normal rate, regular rhythm.  No murmurs, rubs, or gallops. Respiratory: Normal respiratory effort without tachypnea nor retractions. Breath sounds are clear and equal bilaterally. No wheezes/rales/rhonchi. Gastrointestinal: Soft. No distention, no guarding, no rebound. Obese. Approximate 1 inch open midline suprapubic incision draining serous sanguinous fluid which is foul-smelling. No cellulitis. No induration or fluctuance. Moderate tenderness to palpation in the lower  abdomen.  Genitourinary/rectal:Deferred Musculoskeletal: Nontender with normal range of motion in all extremities. No joint effusions.  No lower extremity tenderness nor edema. Neurologic:  Normal speech and language. No gross or focal neurologic deficits are appreciated. Skin:  Skin is warm, dry and intact. No rash noted. Psychiatric: Mood and affect are normal. Speech and behavior are normal. Patient exhibits appropriate insight and judgment.  ____________________________________________   EKG I, Lisa Roca, MD, the attending physician have personally viewed and interpreted all ECGs.  None ____________________________________________  LABS (pertinent positives/negatives)  White blood count 11.6, hemoglobin 40.8 Complete metabolic panel within normal limits Urinalysis positive for 3+ leukocytes, 6-30 white blood cells, many bacteria, 6-30 EPITHELIAL cells. No nitrates  ____________________________________________  RADIOLOGY All Xrays were viewed by me. Imaging interpreted by Radiologist.  CT abdomen and pelvis with contrast: Pending __________________________________________  PROCEDURES  Procedure(s) performed: None Critical Care performed: None  ____________________________________________   ED COURSE / ASSESSMENT AND PLAN  CONSULTATIONS: None  Pertinent labs & imaging results that were available during my care of the patient were reviewed by me and considered in my medical decision making (see chart for details).  I'm concerned about intra-abdominal abscess or complication due to the nonhealing laparoscopic wound with a new change in the drainage, associated with elevated white blood cell count, and lower abdominal pain, as well as the consideration of appendicitis, as well as urinary tract infection based on her urinalysis. I discussed with the patient obtaining the CT scan for further evaluation.  CT scan is pending. Patient transferred to Dr. for block at shift  change 7 AM.  Disposition based on CT scan results.  Patient / Family / Caregiver informed of clinical course, medical decision-making process, and agree with plan.   I discussed return precautions, follow-up instructions, and discharged instructions with patient and/or family.  ___________________________________________   FINAL CLINICAL IMPRESSION(S) / ED DIAGNOSES   Final diagnoses:  Urinary tract infection without hematuria, site unspecified  Nonhealing surgical wound, initial encounter      Lisa Roca, MD 04/07/15 [phone]

## 2015-04-07 NOTE — ED Notes (Signed)
Unsuccessful IV attepmt x 2.

## 2015-04-07 NOTE — ED Provider Notes (Signed)
-----------------------------------------   7 AM on 04/07/2015 -----------------------------------------   Blood pressure 116/58, pulse 87, temperature 98.1 F (36.7 C), temperature source Oral, resp. rate 16, height 5\' 6"  (1.676 m), weight 263 lb (119.296 kg), SpO2 100 %.  Assuming care from Dr. Reita Cliche.  In short, Kristi Gutierrez is a 34 y.o. female with a chief complaint of Abdominal Pain .  Refer to the original H&P for additional details.  The current plan of care is to follow-up CT abdomen and pelvis and reassess.  ----------------------------------------- 9:17 AM on 04/07/2015 -----------------------------------------  The patient's CT abdomen and pelvis was unremarkable with no evidence of an abscess at surgical site.  The patient continues to report pain but she appears to be in no acute distress and has normal vital signs upon reevaluation.  He gave her the good news about her CT scan report.  She continues to complain of pain at the open surgical site, but I explained that it needs to heal from inside out and that she should follow-up with Dr. Glennon Mac regarding her chronic pain in the area of her surgery.  Of note, I included a report from the [place] controlled substances database to be scanned in; she has had 6 prescriptions for narcotics filled in the last 3 months, and I will decline to prescribe any additional narcotics at this time in favor of having her follow up with Dr. Glennon Mac.  I will, however, prescribed Keflex and Pyridium for her symptoms that may be related to urinary tract infection.   Hinda Kehr, MD 04/07/15 [phone]

## 2015-04-07 NOTE — Discharge Instructions (Signed)
Your evaluation in the emergency department was reassuring today.  Your CT scan does not show any sign of severe infection, even at the site of your surgical wound.  Please keep the wound clean and dry and apply a topical antibiotics ointment.  Take the prescribed antibiotics for your urinary tract infection.  Follow-up with Dr. Glennon Mac at the next available opportunity to discuss your ongoing pain.  Take regular over-the-counter medication as indicated or as prescribed by Dr. Glennon Mac.   Urinary Tract Infection Urinary tract infections (UTIs) can develop anywhere along your urinary tract. Your urinary tract is your body's drainage system for removing wastes and extra water. Your urinary tract includes two kidneys, two ureters, a bladder, and a urethra. Your kidneys are a pair of bean-shaped organs. Each kidney is about the size of your fist. They are located below your ribs, one on each side of your spine. CAUSES Infections are caused by microbes, which are microscopic organisms, including fungi, viruses, and bacteria. These organisms are so small that they can only be seen through a microscope. Bacteria are the microbes that most commonly cause UTIs. SYMPTOMS  Symptoms of UTIs may vary by age and gender of the patient and by the location of the infection. Symptoms in young women typically include a frequent and intense urge to urinate and a painful, burning feeling in the bladder or urethra during urination. Older women and men are more likely to be tired, shaky, and weak and have muscle aches and abdominal pain. A fever may mean the infection is in your kidneys. Other symptoms of a kidney infection include pain in your back or sides below the ribs, nausea, and vomiting. DIAGNOSIS To diagnose a UTI, your caregiver will ask you about your symptoms. Your caregiver also will ask to provide a urine sample. The urine sample will be tested for bacteria and white blood cells. White blood cells are made by your  body to help fight infection. TREATMENT  Typically, UTIs can be treated with medication. Because most UTIs are caused by a bacterial infection, they usually can be treated with the use of antibiotics. The choice of antibiotic and length of treatment depend on your symptoms and the type of bacteria causing your infection. HOME CARE INSTRUCTIONS  If you were prescribed antibiotics, take them exactly as your caregiver instructs you. Finish the medication even if you feel better after you have only taken some of the medication.  Drink enough water and fluids to keep your urine clear or pale yellow.  Avoid caffeine, tea, and carbonated beverages. They tend to irritate your bladder.  Empty your bladder often. Avoid holding urine for long periods of time.  Empty your bladder before and after sexual intercourse.  After a bowel movement, women should cleanse from front to back. Use each tissue only once. SEEK MEDICAL CARE IF:   You have back pain.  You develop a fever.  Your symptoms do not begin to resolve within 3 days. SEEK IMMEDIATE MEDICAL CARE IF:   You have severe back pain or lower abdominal pain.  You develop chills.  You have nausea or vomiting.  You have continued burning or discomfort with urination. MAKE SURE YOU:   Understand these instructions.  Will watch your condition.  Will get help right away if you are not doing well or get worse. Document Released: 06/20/2005 Document Revised: 03/11/2012 Document Reviewed: 10/19/2011 [facility] Patient Information 2015 [place], [place]. This information is not intended to replace advice given to you by your  health care provider. Make sure you discuss any questions you have with your health care provider. ° °

## 2015-04-09 LAB — URINE CULTURE

## 2015-04-12 LAB — CULTURE, BLOOD (ROUTINE X 2)
Culture: NO GROWTH
Culture: NO GROWTH

## 2015-05-11 ENCOUNTER — Encounter: Payer: Self-pay | Admitting: Emergency Medicine

## 2015-05-11 DIAGNOSIS — N938 Other specified abnormal uterine and vaginal bleeding: Secondary | ICD-10-CM | POA: Diagnosis not present

## 2015-05-11 DIAGNOSIS — N939 Abnormal uterine and vaginal bleeding, unspecified: Secondary | ICD-10-CM | POA: Diagnosis present

## 2015-05-11 DIAGNOSIS — Z79899 Other long term (current) drug therapy: Secondary | ICD-10-CM | POA: Diagnosis not present

## 2015-05-11 DIAGNOSIS — Z3202 Encounter for pregnancy test, result negative: Secondary | ICD-10-CM | POA: Insufficient documentation

## 2015-05-11 LAB — BASIC METABOLIC PANEL
ANION GAP: 10 (ref 5–15)
BUN: 17 mg/dL (ref 6–20)
CALCIUM: 9.1 mg/dL (ref 8.9–10.3)
CO2: 21 mmol/L — ABNORMAL LOW (ref 22–32)
Chloride: 107 mmol/L (ref 101–111)
Creatinine, Ser: 0.92 mg/dL (ref 0.44–1.00)
GFR calc Af Amer: >60 mL/min (ref >60)
GLUCOSE: 125 mg/dL — AB (ref 65–99)
POTASSIUM: 3.7 mmol/L (ref 3.5–5.1)
Sodium: 138 mmol/L (ref 135–145)

## 2015-05-11 LAB — CBC
HCT: 37.6 % (ref 35.0–47.0)
Hemoglobin: 12.6 g/dL (ref 12.0–16.0)
MCH: 28.6 pg (ref 26.0–34.0)
MCHC: 33.4 g/dL (ref 32.0–36.0)
MCV: 85.6 fL (ref 80.0–100.0)
Platelets: 214 10*3/uL (ref 150–440)
RBC: 4.39 MIL/uL (ref 3.80–5.20)
RDW: 13.4 % (ref 11.5–14.5)
WBC: 8.8 10*3/uL (ref 3.6–11.0)

## 2015-05-11 NOTE — ED Notes (Signed)
Patient ambulatory to triage with steady gait, without difficulty or distress noted; pt reports lower abd cramping since 11am; st symptoms have persisted since tubal ligation 70months ago with heavy vag bleeding

## 2015-05-12 ENCOUNTER — Emergency Department
Admission: EM | Admit: 2015-05-12 | Discharge: 2015-05-12 | Disposition: A | Discharge status: Home / Self Care | Payer: Medicaid Other | Attending: Emergency Medicine | Admitting: Emergency Medicine

## 2015-05-12 ENCOUNTER — Emergency Department: Payer: Medicaid Other

## 2015-05-12 DIAGNOSIS — N938 Other specified abnormal uterine and vaginal bleeding: Secondary | ICD-10-CM

## 2015-05-12 LAB — URINALYSIS COMPLETE WITH MICROSCOPIC (ARMC ONLY)
Bacteria, UA: NONE SEEN
Bilirubin Urine: NEGATIVE
Glucose, UA: NEGATIVE mg/dL
KETONES UR: NEGATIVE mg/dL
Nitrite: NEGATIVE
PH: 6 (ref 5.0–8.0)
Protein, ur: 100 mg/dL — AB
Specific Gravity, Urine: 1.025 (ref 1.005–1.030)

## 2015-05-12 LAB — PREGNANCY, URINE: Preg Test, Ur: NEGATIVE

## 2015-05-12 MED ORDER — HYDROCODONE-ACETAMINOPHEN 5-325 MG PO TABS: 1.0000 | ORAL_TABLET | ORAL | Status: DC | PRN

## 2015-05-12 MED ORDER — OXYCODONE-ACETAMINOPHEN 5-325 MG PO TABS
1.0000 | ORAL_TABLET | Freq: Once | ORAL | Status: AC
  Administered 2015-05-12: 1 via ORAL
  Filled 2015-05-12: qty 1

## 2015-05-12 MED ORDER — NORGESTIMATE-ETH ESTRADIOL 0.25-35 MG-MCG PO TABS: 1.0000 | ORAL_TABLET | Freq: Every day | ORAL | Status: DC

## 2015-05-12 NOTE — ED Notes (Signed)
Pt here with lower abd pain. Pt had a tubal ligation several months ago and she has cont to have problems since. Pt states that she started to have abd cramping and heavy menstrual flow. Pt in NAD at this time will cont to monitor pt at all times

## 2015-05-12 NOTE — ED Provider Notes (Signed)
[facility] Emergency Department Provider Note  Time seen: 1:39 AM  I have reviewed the triage vital signs and the nursing notes.   HISTORY  Chief Complaint Abdominal Cramping and Vaginal Bleeding    HPI Kristi Gutierrez is a 34 y.o. female presents the emergency department lower abdominal cramping, and bleeding 2 weeks. According to the patient she has been having a very heavy period for the past 2 weeks. She is also had lower abdominal cramping. She states the cramping has worsened gradually over the past 2 weeks, today she is beginning to feel lightheaded so she came to the department for evaluation. Denies any acute worsening of pain, but does state has gradually worsened. Denies any vaginal discharge. Denies possibility of pregnancy. Patient had a tubal ligation 2 months ago (which was a repeat operation). Patient sees [facility] OB[facility], but has not spoken to them about her issues. Currently describes her abdominal pain is cramping, moderate in severity, located in the lower abdomen. No modifying factors identified.     Past Medical History  Diagnosis Date  . Pseudotumor cerebri   . Migraine     Patient Active Problem List   Diagnosis Date Noted  . Admission for sterilization 03/01/2015    Past Surgical History  Procedure Laterality Date  . Cyst excision    . Attempted btl-had to abort procedure  10-2014  . Mouth surgery    . Laparoscopic tubal ligation Bilateral 03/01/2015    Procedure: LAPAROSCOPIC TUBAL LIGATION;  Surgeon: Will Bonnet, MD;  Location: [facility] ORS;  Service: Gynecology;  Laterality: Bilateral;    Current Outpatient Rx  Name  Route  Sig  Dispense  Refill  . cephALEXin (KEFLEX) 500 MG capsule   Oral   Take 1 capsule (500 mg total) by mouth 3 (three) times daily.   30 capsule   0   . ondansetron (ZOFRAN) 4 MG tablet      Take 1-2 tabs by mouth every 8 hours as needed for nausea/vomiting   30 tablet   0   .  oxyCODONE-acetaminophen (PERCOCET/ROXICET) 5-325 MG per tablet   Oral   Take 1-2 tablets by mouth every 6 (six) hours as needed for severe pain. Patient not taking: Reported on 04/07/2015   12 tablet   0   . phenazopyridine (PYRIDIUM) 95 MG tablet   Oral   Take 2 tablets (190 mg total) by mouth 3 (three) times daily as needed for pain.   12 tablet   0     Allergies Review of patient's allergies indicates no known allergies.  No family history on file.  Social History Social History  Substance Use Topics  . Smoking status: Never Smoker   . Smokeless tobacco: None  . Alcohol Use: No     Comment: occ    Review of Systems Constitutional: Negative for fever Cardiovascular: Negative for chest pain. Respiratory: Negative for shortness of breath. Gastrointestinal: Positive for lower abdominal cramping and nausea. Negative for vomiting or diarrhea. Genitourinary: Negative for dysuria. Positive for vaginal bleeding 2 weeks. Musculoskeletal: Negative for back pain. 10-point ROS otherwise negative.  ____________________________________________   PHYSICAL EXAM:  VITAL SIGNS: ED Triage Vitals  Enc Vitals Group     BP 05/11/15 2152 124/59 mmHg     Pulse Rate 05/11/15 2152 89     Resp 05/11/15 2152 20     Temp 05/11/15 2152 97.7 F (36.5 C)     Temp Source 05/11/15 2152 Oral     SpO2  05/11/15 2152 100 %     Weight 05/11/15 2152 250 lb (113.399 kg)     Height 05/11/15 2152 5\' 6"  (1.676 m)     Head Cir --      Peak Flow --      Pain Score 05/11/15 2152 10     Pain Loc --      Pain Edu? --      Excl. in [place]? --     Constitutional: Alert and oriented. Well appearing and in no distress. Eyes: Normal exam ENT   Mouth/Throat: Mucous membranes are moist. Cardiovascular: Normal rate, regular rhythm. No murmurs, rubs, or gallops. Respiratory: Normal respiratory effort without tachypnea nor retractions. Breath sounds are clear and equal bilaterally. No  wheezes/rales/rhonchi. Gastrointestinal: Soft, obese, mild lower abdominal/suprapubic tenderness palpation. No distention. No rebound or guarding. Musculoskeletal: Nontender with normal range of motion in all extremities.  Neurologic:  Normal speech and language. No gross focal neurologic deficits Skin:  Skin is warm, dry and intact.  Psychiatric: Mood and affect are normal. Speech and behavior are normal.   ____________________________________________    RADIOLOGY  Ultrasound and normal limits  ____________________________________________    INITIAL IMPRESSION / ASSESSMENT AND PLAN / ED COURSE  Pertinent labs & imaging results that were available during my care of the patient were reviewed by me and considered in my medical decision making (see chart for details).  Patient with lower abdominal pain and cramping as well as vaginal bleeding 2 weeks. She states in the past she has had heavy bleeding requiring her to go on birth control pills to regulate her cycle which worked very well for her. States her bleeding has continued for the last 2 weeks, and now she is feeling lightheaded at times. Also states progressive worsening of her abdominal cramping 2 weeks. Patient has not seen her OB/GYN, or called about these complaints.  Ultrasound within normal limits. Labs are largely within normal limits as well. Blood but no bacteria and urinalysis. Urine pregnancy test did not run, I discussed this with the patient. Denies any possibility of being pregnant. I also discussed with the patient need to follow up with OB/GYN as soon as possible. She is agreeable to plan. We will start the patient on Sprintec, and have her follow up with OB/GYN. Norco for a short course for abdominal cramping. Discussed strict abdominal pain return precautions with the patient which she is agreeable.  ____________________________________________   FINAL CLINICAL IMPRESSION(S) / ED DIAGNOSES  Abdominal  cramping Menorrhagia   Harvest Dark, MD 05/12/15 [phone]

## 2015-05-12 NOTE — Discharge Instructions (Signed)
Abnormal Uterine Bleeding Abnormal uterine bleeding can affect women at various stages in life, including teenagers, women in their reproductive years, pregnant women, and women who have reached menopause. Several kinds of uterine bleeding are considered abnormal, including:  Bleeding or spotting between periods.   Bleeding after sexual intercourse.   Bleeding that is heavier or more than normal.   Periods that last longer than usual.  Bleeding after menopause.  Many cases of abnormal uterine bleeding are minor and simple to treat, while others are more serious. Any type of abnormal bleeding should be evaluated by your health care provider. Treatment will depend on the cause of the bleeding. HOME CARE INSTRUCTIONS Monitor your condition for any changes. The following actions may help to alleviate any discomfort you are experiencing:  Avoid the use of tampons and douches as directed by your health care provider.  Change your pads frequently. You should get regular pelvic exams and Pap tests. Keep all follow-up appointments for diagnostic tests as directed by your health care provider.  SEEK MEDICAL CARE IF:   Your bleeding lasts more than 1 week.   You feel dizzy at times.  SEEK IMMEDIATE MEDICAL CARE IF:   You pass out.   You are changing pads every 15 to 30 minutes.   You have abdominal pain.  You have a fever.   You become sweaty or weak.   You are passing large blood clots from the vagina.   You start to feel nauseous and vomit. MAKE SURE YOU:   Understand these instructions.  Will watch your condition.  Will get help right away if you are not doing well or get worse. Document Released: 09/10/2005 Document Revised: 09/15/2013 Document Reviewed: 04/09/2013 [employer] Patient Information 2015 [employer]. This information is not intended to replace advice given to you by your health care provider. Make sure you discuss any questions you have with your  health care provider.  

## 2015-05-19 ENCOUNTER — Emergency Department (HOSPITAL_COMMUNITY)
Admission: EM | Admit: 2015-05-19 | Discharge: 2015-05-20 | Disposition: A | Discharge status: Home / Self Care | Payer: Medicaid Other | Attending: Emergency Medicine | Admitting: Emergency Medicine

## 2015-05-19 ENCOUNTER — Encounter (HOSPITAL_COMMUNITY): Payer: Self-pay | Admitting: *Deleted

## 2015-05-19 DIAGNOSIS — R197 Diarrhea, unspecified: Secondary | ICD-10-CM | POA: Diagnosis not present

## 2015-05-19 DIAGNOSIS — R1084 Generalized abdominal pain: Secondary | ICD-10-CM | POA: Diagnosis not present

## 2015-05-19 DIAGNOSIS — R1030 Lower abdominal pain, unspecified: Secondary | ICD-10-CM | POA: Diagnosis present

## 2015-05-19 DIAGNOSIS — Z8669 Personal history of other diseases of the nervous system and sense organs: Secondary | ICD-10-CM | POA: Insufficient documentation

## 2015-05-19 DIAGNOSIS — E669 Obesity, unspecified: Secondary | ICD-10-CM | POA: Diagnosis not present

## 2015-05-19 DIAGNOSIS — R112 Nausea with vomiting, unspecified: Secondary | ICD-10-CM | POA: Diagnosis not present

## 2015-05-19 DIAGNOSIS — Z8679 Personal history of other diseases of the circulatory system: Secondary | ICD-10-CM | POA: Insufficient documentation

## 2015-05-19 DIAGNOSIS — Z3202 Encounter for pregnancy test, result negative: Secondary | ICD-10-CM | POA: Insufficient documentation

## 2015-05-19 LAB — CBC
HCT: 39 % (ref 36.0–46.0)
Hemoglobin: 13.1 g/dL (ref 12.0–15.0)
MCH: 28.9 pg (ref 26.0–34.0)
MCHC: 33.6 g/dL (ref 30.0–36.0)
MCV: 86.1 fL (ref 78.0–100.0)
PLATELETS: 237 10*3/uL (ref 150–400)
RBC: 4.53 MIL/uL (ref 3.87–5.11)
RDW: 12.9 % (ref 11.5–15.5)
WBC: 9.1 10*3/uL (ref 4.0–10.5)

## 2015-05-19 LAB — COMPREHENSIVE METABOLIC PANEL
ALK PHOS: 86 U/L (ref 38–126)
ALT: 61 U/L — AB (ref 14–54)
AST: 41 U/L (ref 15–41)
Albumin: 3.7 g/dL (ref 3.5–5.0)
Anion gap: 9 (ref 5–15)
BUN: 15 mg/dL (ref 6–20)
CALCIUM: 9.7 mg/dL (ref 8.9–10.3)
CHLORIDE: 105 mmol/L (ref 101–111)
CO2: 25 mmol/L (ref 22–32)
CREATININE: 0.81 mg/dL (ref 0.44–1.00)
Glucose, Bld: 122 mg/dL — ABNORMAL HIGH (ref 65–99)
Potassium: 3.9 mmol/L (ref 3.5–5.1)
SODIUM: 139 mmol/L (ref 135–145)
Total Bilirubin: 0.4 mg/dL (ref 0.3–1.2)
Total Protein: 7.4 g/dL (ref 6.5–8.1)

## 2015-05-19 LAB — LIPASE, BLOOD: LIPASE: 30 U/L (ref 22–51)

## 2015-05-19 MED ORDER — MORPHINE SULFATE (PF) 4 MG/ML IV SOLN
4.0000 mg | Freq: Once | INTRAVENOUS | Status: AC
  Administered 2015-05-20: 4 mg via INTRAVENOUS
  Filled 2015-05-19: qty 1

## 2015-05-19 MED ORDER — SODIUM CHLORIDE 0.9 % IV SOLN
INTRAVENOUS | Status: DC
  Administered 2015-05-19: via INTRAVENOUS

## 2015-05-19 MED ORDER — ONDANSETRON HCL 4 MG/2ML IJ SOLN
4.0000 mg | Freq: Once | INTRAMUSCULAR | Status: AC
  Administered 2015-05-19: 4 mg via INTRAVENOUS
  Filled 2015-05-19: qty 2

## 2015-05-19 MED ORDER — DICYCLOMINE HCL 10 MG/ML IM SOLN
20.0000 mg | Freq: Once | INTRAMUSCULAR | Status: AC
  Administered 2015-05-20: 20 mg via INTRAMUSCULAR
  Filled 2015-05-19: qty 2

## 2015-05-19 NOTE — ED Notes (Signed)
Patient presents with c/o lower abd pain and right side pain and it radiates to the back

## 2015-05-19 NOTE — ED Provider Notes (Signed)
CSN: [id number]     Arrival date & time 05/19/15  2233 History  This chart was scribed for Linton Flemings, MD by Randa Evens, ED Scribe. This patient was seen in room A06C/A06C and the patient's care was started at 11:05 PM.     Chief Complaint  Patient presents with  . Abdominal Pain   The history is provided by the patient. No language interpreter was used.   HPI Comments: Kristi Gutierrez is a 34 y.o. female who presents to the Emergency Department complaining of lower abdominal pain onset 1 day prior. Pt states that the pain began as a dull pain and has progressed to a stabbing shooting pain that radiates to her right side. Pt reports associated chills, nausea, vomiting and  diarrhea. Pt states that she has tried tylenol an motrin. Pt states she is not able to tolerate solids or liquids. She denies any recent sick contacts or suspect food intake. Pt denies fever or other symptoms. Pt states she has family Hx of gallstones. Pt reports Hx of tubal ligation.      Past Medical History  Diagnosis Date  . Pseudotumor cerebri   . Migraine    Past Surgical History  Procedure Laterality Date  . Cyst excision    . Attempted btl-had to abort procedure  10-2014  . Mouth surgery    . Laparoscopic tubal ligation Bilateral 03/01/2015    Procedure: LAPAROSCOPIC TUBAL LIGATION;  Surgeon: Will Bonnet, MD;  Location: [facility] ORS;  Service: Gynecology;  Laterality: Bilateral;   No family history on file. Social History  Substance Use Topics  . Smoking status: Never Smoker   . Smokeless tobacco: Never Used  . Alcohol Use: No     Comment: occ   OB History    No data available     Review of Systems  Constitutional: Positive for chills. Negative for fever.  Gastrointestinal: Positive for nausea, vomiting, abdominal pain and diarrhea.  All other systems reviewed and are negative.     Allergies  Review of patient's allergies indicates no known allergies.  Home Medications   Prior to  Admission medications   Medication Sig Start Date End Date Taking? Authorizing Provider  cephALEXin (KEFLEX) 500 MG capsule Take 1 capsule (500 mg total) by mouth 3 (three) times daily. 04/07/15   Hinda Kehr, MD  HYDROcodone-acetaminophen (NORCO/VICODIN) 5-325 MG per tablet Take 1 tablet by mouth every 4 (four) hours as needed for moderate pain. 05/12/15   Harvest Dark, MD  norgestimate-ethinyl estradiol (Alvan 28) 0.25-35 MG-MCG tablet Take 1 tablet by mouth daily. 05/12/15   Harvest Dark, MD  ondansetron [facility]) 4 MG tablet Take 1-2 tabs by mouth every 8 hours as needed for nausea/vomiting 04/07/15   Hinda Kehr, MD  oxyCODONE-acetaminophen (PERCOCET/ROXICET) 5-325 MG per tablet Take 1-2 tablets by mouth every 6 (six) hours as needed for severe pain. Patient not taking: Reported on 04/07/2015 03/23/15   [name], MD  phenazopyridine (PYRIDIUM) 95 MG tablet Take 2 tablets (190 mg total) by mouth 3 (three) times daily as needed for pain. 04/07/15   Hinda Kehr, MD   BP 127/80 mmHg  Pulse 90  Temp(Src) 97.8 F (36.6 C) (Oral)  Resp 16  SpO2 98%  LMP 05/11/2015 (Exact Date)   Physical Exam  Constitutional: She is oriented to person, place, and time. She appears well-developed and well-nourished.  Obese female, uncomfortable appearing  HENT:  Head: Normocephalic and atraumatic.  Nose: Nose normal.  Mouth/Throat: Oropharynx is clear and  moist.  Eyes: Conjunctivae and EOM are normal. Pupils are equal, round, and reactive to light.  Neck: Normal range of motion. Neck supple. No JVD present. No tracheal deviation present. No thyromegaly present.  Cardiovascular: Normal rate, regular rhythm, normal heart sounds and intact distal pulses.  Exam reveals no gallop and no friction rub.   No murmur heard. Pulmonary/Chest: Effort normal and breath sounds normal. No stridor. No respiratory distress. She has no wheezes. She has no rales. She exhibits no tenderness.  Abdominal: Soft.  She exhibits no distension and no mass. There is tenderness. There is no rebound and no guarding.  Tenderness with palpation to right upper quadrant.  No rebound or guarding.  Diffuse pain throughout the abdomen.  Hyperactive bowel sounds  Musculoskeletal: Normal range of motion. She exhibits no edema or tenderness.  Lymphadenopathy:    She has no cervical adenopathy.  Neurological: She is alert and oriented to person, place, and time. She displays normal reflexes. She exhibits normal muscle tone. Coordination normal.  Skin: Skin is warm and dry. No rash noted. No erythema. No pallor.  Psychiatric: She has a normal mood and affect. Her behavior is normal. Judgment and thought content normal.  Nursing note and vitals reviewed.   ED Course  Procedures (including critical care time) DIAGNOSTIC STUDIES: Oxygen Saturation is 98% on RA, normal by my interpretation.    COORDINATION OF CARE: 11:21 PM-Discussed treatment plan which with pt at bedside and pt agreed to plan.     Labs Review Labs Reviewed  COMPREHENSIVE METABOLIC PANEL - Abnormal; Notable for the following:    Glucose, Bld 122 (*)    ALT 61 (*)    All other components within normal limits  URINALYSIS, ROUTINE W REFLEX MICROSCOPIC (NOT AT [facility]) - Abnormal; Notable for the following:    APPearance CLOUDY (*)    All other components within normal limits  LIPASE, BLOOD  CBC  PREGNANCY, URINE  POC URINE PREG, ED    Imaging Review US Abdomen Limited  05/20/2015   CLINICAL DATA:  Right upper quadrant pain. Nausea, vomiting, and diarrhea. Pain for 2 days.  EXAM: US ABDOMEN LIMITED - RIGHT UPPER QUADRANT  COMPARISON:  CT abdomen and pelvis 04/07/2015  FINDINGS: Gallbladder:  No gallstones or wall thickening visualized. No sonographic Murphy sign noted.  Common bile duct:  Diameter: 6.7 mm, normal  Liver:  Diffusely increased parenchymal echotexture suggesting fatty liver infiltration. No focal lesions identified.  IMPRESSION:  Diffuse fatty infiltration of the liver. No evidence of cholelithiasis or cholecystitis.   Electronically Signed   By: Lucienne Capers M.D.   On: 05/20/2015 02:19   I have personally reviewed and evaluated these images and lab results as part of my medical decision-making.   EKG Interpretation None      MDM   Final diagnoses:  Generalized abdominal pain  Nausea vomiting and diarrhea     I personally performed the services described in this documentation, which was scribed in my presence. The recorded information has been reviewed and is accurate.  34 year old female with nausea, vomiting and diarrhea, diffuse abdominal pain with radiation up in the right upper quadrant.  She does have more tenderness on palpation with the right upper quadrant.  Patient has been having ongoing pain since having tubal ligation.  She has had multiple CAT scans done the last year.  I'm hesitant to get further CT scans.  We'll get ultrasound looking at the gallbladder, and manage pain, nausea.  Lab work normal.  Pain has been difficult to control, but workup has been unremarkable.  We'll have her follow-up with her gynecologist for further evaluation of ongoing pain status post tubal ligation    Linton Flemings, MD 05/20/15 0700

## 2015-05-20 ENCOUNTER — Emergency Department (HOSPITAL_COMMUNITY): Payer: Medicaid Other

## 2015-05-20 LAB — URINALYSIS, ROUTINE W REFLEX MICROSCOPIC
Bilirubin Urine: NEGATIVE
GLUCOSE, UA: NEGATIVE mg/dL
HGB URINE DIPSTICK: NEGATIVE
Ketones, ur: NEGATIVE mg/dL
Leukocytes, UA: NEGATIVE
Nitrite: NEGATIVE
PH: 5 (ref 5.0–8.0)
PROTEIN: NEGATIVE mg/dL
Specific Gravity, Urine: 1.026 (ref 1.005–1.030)
Urobilinogen, UA: 0.2 mg/dL (ref 0.0–1.0)

## 2015-05-20 LAB — PREGNANCY, URINE: Preg Test, Ur: NEGATIVE

## 2015-05-20 MED ORDER — OXYCODONE-ACETAMINOPHEN 5-325 MG PO TABS
1.0000 | ORAL_TABLET | Freq: Four times a day (QID) | ORAL | Status: DC | PRN
Start: 2015-05-20 — End: 2015-09-28

## 2015-05-20 MED ORDER — PROMETHAZINE HCL 25 MG PO TABS
25.0000 mg | ORAL_TABLET | Freq: Four times a day (QID) | ORAL | Status: DC | PRN
Start: 2015-05-20 — End: 2015-12-20

## 2015-05-20 MED ORDER — ONDANSETRON HCL 4 MG PO TABS: ORAL_TABLET | ORAL | Status: DC

## 2015-05-20 MED ORDER — DICYCLOMINE HCL 20 MG PO TABS
20.0000 mg | ORAL_TABLET | Freq: Four times a day (QID) | ORAL | Status: DC | PRN
Start: 2015-05-20 — End: 2015-12-20

## 2015-05-20 MED ORDER — OXYCODONE-ACETAMINOPHEN 5-325 MG PO TABS
2.0000 | ORAL_TABLET | Freq: Once | ORAL | Status: AC
  Administered 2015-05-20: 2 via ORAL
  Filled 2015-05-20: qty 2

## 2015-05-20 NOTE — Discharge Instructions (Signed)
Follow-up with her gynecologist for further workup of your ongoing abdominal pain.  Stick to a bland diet until feeling better.  Take medications as prescribed.   Abdominal Pain, Women Abdominal (stomach, pelvic, or belly) pain can be caused by many things. It is important to tell your doctor:  The location of the pain.  Does it come and go or is it present all the time?  Are there things that start the pain (eating certain foods, exercise)?  Are there other symptoms associated with the pain (fever, nausea, vomiting, diarrhea)? All of this is helpful to know when trying to find the cause of the pain. CAUSES   Stomach: virus or bacteria infection, or ulcer.  Intestine: appendicitis (inflamed appendix), regional ileitis (Crohn's disease), ulcerative colitis (inflamed colon), irritable bowel syndrome, diverticulitis (inflamed diverticulum of the colon), or cancer of the stomach or intestine.  Gallbladder disease or stones in the gallbladder.  Kidney disease, kidney stones, or infection.  Pancreas infection or cancer.  Fibromyalgia (pain disorder).  Diseases of the female organs:  Uterus: fibroid (non-cancerous) tumors or infection.  Fallopian tubes: infection or tubal pregnancy.  Ovary: cysts or tumors.  Pelvic adhesions (scar tissue).  Endometriosis (uterus lining tissue growing in the pelvis and on the pelvic organs).  Pelvic congestion syndrome (female organs filling up with blood just before the menstrual period).  Pain with the menstrual period.  Pain with ovulation (producing an egg).  Pain with an IUD (intrauterine device, birth control) in the uterus.  Cancer of the female organs.  Functional pain (pain not caused by a disease, may improve without treatment).  Psychological pain.  Depression. DIAGNOSIS  Your doctor will decide the seriousness of your pain by doing an examination.  Blood tests.  X-rays.  Ultrasound.  CT scan (computed tomography,  special type of X-ray).  MRI (magnetic resonance imaging).  Cultures, for infection.  Barium enema (dye inserted in the large intestine, to better view it with X-rays).  Colonoscopy (looking in intestine with a lighted tube).  Laparoscopy (minor surgery, looking in abdomen with a lighted tube).  Major abdominal exploratory surgery (looking in abdomen with a large incision). TREATMENT  The treatment will depend on the cause of the pain.   Many cases can be observed and treated at home.  Over-the-counter medicines recommended by your caregiver.  Prescription medicine.  Antibiotics, for infection.  Birth control pills, for painful periods or for ovulation pain.  Hormone treatment, for endometriosis.  Nerve blocking injections.  Physical therapy.  Antidepressants.  Counseling with a psychologist or psychiatrist.  Minor or major surgery. HOME CARE INSTRUCTIONS   Do not take laxatives, unless directed by your caregiver.  Take over-the-counter pain medicine only if ordered by your caregiver. Do not take aspirin because it can cause an upset stomach or bleeding.  Try a clear liquid diet (broth or water) as ordered by your caregiver. Slowly move to a bland diet, as tolerated, if the pain is related to the stomach or intestine.  Have a thermometer and take your temperature several times a day, and record it.  Bed rest and sleep, if it helps the pain.  Avoid sexual intercourse, if it causes pain.  Avoid stressful situations.  Keep your follow-up appointments and tests, as your caregiver orders.  If the pain does not go away with medicine or surgery, you may try:  Acupuncture.  Relaxation exercises (yoga, meditation).  Group therapy.  Counseling. SEEK MEDICAL CARE IF:   You notice certain foods cause stomach  pain.  Your home care treatment is not helping your pain.  You need stronger pain medicine.  You want your IUD removed.  You feel faint or  lightheaded.  You develop nausea and vomiting.  You develop a rash.  You are having side effects or an allergy to your medicine. SEEK IMMEDIATE MEDICAL CARE IF:   Your pain does not go away or gets worse.  You have a fever.  Your pain is felt only in portions of the abdomen. The right side could possibly be appendicitis. The left lower portion of the abdomen could be colitis or diverticulitis.  You are passing blood in your stools (bright red or black tarry stools, with or without vomiting).  You have blood in your urine.  You develop chills, with or without a fever.  You pass out. MAKE SURE YOU:   Understand these instructions.  Will watch your condition.  Will get help right away if you are not doing well or get worse. Document Released: 07/08/2007 Document Revised: 01/25/2014 Document Reviewed: 07/28/2009 [facility] Patient Information 2015 [place], [place]. This information is not intended to replace advice given to you by your health care provider. Make sure you discuss any questions you have with your health care provider.  Take medications for nausea/vomiting as needed.  Drink plenty of fluids.  Stick to bland diet.  Return to the ER for worsening pain, vomiting despite medication, fevers, or new symptoms.  Follow up with your doctor in 3-5 days for recheck.  If you do not have a doctor, see numbers listed to set up a primary care physician.   GI ANTIEMETIC  GI ANTIEMETIC: You have been given a prescription for a medication for nausea and vomiting.      It is OK to take this medication if you are pregnant.  Be sure to tell your regular doctor or obstetrician [facility] doctor) that you have been taking this medication.     Take this medication as directed.     If you are taking phenobarbital, narcotic pain medications, antidepressants, or sleeping pills your dosage may need to be adjusted.  Be sure to inform your doctor of all the other medications that you are taking.      DO NOT take this medication if you have liver disease or heart disease.     DO NOT take pain killers (narcotic medication) unless specifically instructed to do so by your doctor     DO NOT drink alcoholic beverages while taking this medicine.     If you develop any reactions that you believe may be from the medication be sure to tell your doctor or return to the ER (Some reactions may include:  dizziness, shaking, visual disturbances, nervousness, fainting, rash).     If you become dizzy, sit or lie down at the first signs.  You should be careful going up and down stairs.     Keep this medication out of the reach of children.  Always keep this medication in child-proof containers.  DO NOT give your medication to anyone else. You have been given a medication, or a prescription for a medication, that causes drowsiness or dizziness.  DO NOT drive a car, operate machinery, ride a bike, or perform jobs that require you to be alert until you know how you are going to react to this medication.  THESE INSTRUCTIONS ARE NOT COMPREHENSIVE (complete):  Ask your pharmacist for additional information and precautions for this medication.   VOMITING  VOMITING: You have  been seen for vomiting.  Vomiting (throwing-up) can be caused by many different things. Most of the time the cause IS NOT serious and it is safe to send someone home when they have been vomiting.  Some common causes of vomiting include the following:      Gastroenteritis (stomach flu). Diarrhea usually also occurs with "stomach flu."     Other illnesses. Sometimes other medical conditions such as diabetes, heart problems, headaches, and painful conditions can make someone throw-up. Certain infections, even in other parts of the body, can cause vomiting.     Bowel obstructions (blockages) can cause vomiting along with the inability to have bowel movements (stool) or pass gas.     Vomiting can also be a symptom of appendicitis,  especially if there is also pain in the right lower abdomen (belly). The treatment for vomiting is to try to find the cause for the vomiting and make it better. Sometimes the cause can't be easily found. Sometimes vomiting is treated with anti-nausea medicines like Phenergan (promethazine), Compazine (prochlorperazine) or Zofran (ondansetron).  You should try to drink liquids to avoid dehydration.  Rather than drinking a lot of fluid all at once, you should take small sips continuously throughout the day.  YOU SHOULD SEEK MEDICAL ATTENTION IMMEDIATELY, EITHER HERE OR AT THE NEAREST EMERGENCY DEPARTMENT, IF ANY OF THE FOLLOWING OCCURS:      Persistent vomiting that is not relieved with medication.     Inability to keep liquids down.     Severe sudden chest or belly pain after vomiting.     Abdominal pain.  NAUSEA  NAUSEA: You have been seen today for nausea.  Nausea is the medical word that means that you feel that you are going to throw up (vomit). Nausea is not a disease but rather it is a symptom of another problem. For example, nausea, vomiting and diarrhea are symptoms of a stomach virus (the "stomach flu").  The feeling of nausea may also go along with actually vomiting (throwing up).  The symptom of nausea means that there is some other problem or disease causing the nausea. The nausea itself is not dangerous but it can be very uncomfortable.  Many causes of nausea can not be determined by the tests available in an Urgent Care or Emergency Department. It is VERY IMPORTANT to follow up with your doctor for on going care and evaluation.  There are a number of treatments available for nausea. These can be discussed with the medical staff along with treatment of the illness causing the nausea. Some of the more common medications (given by prescription) that are used to help with nausea include the following:      Promethazine (Phenergan), Prochlorperazine (Compazine), Metoclopramide  (Reglan), Ondansetron (Zofran), and many others. In order to avoid worsening your nausea and causing you to vomit, it is recommended that you follow a clear liquid diet (such as water, clear broth, 7-up, Sprite, other clear caffeine-free soft drinks, or sports drinks) until you feel better.  It is STRONGLY RECOMMENDED that if your nausea continues for longer than a few days, or other new symptoms arise, that you be reevaluated by your family doctor, specialist or clinic. If you cannot obtain this follow-up care you can always return here or go to the nearest Emergency Department to be seen again.  YOU SHOULD SEEK MEDICAL ATTENTION IMMEDIATELY, EITHER HERE OR AT THE NEAREST EMERGENCY DEPARTMENT, IF ANY OF THE FOLLOWING OCCURS:      Repeated, continuous vomiting.  Abdominal pain.     Vomiting blood or anything that looks like coffee grounds in your vomit.     Headache.     Any new, concerning symptoms that appear after you are discharged from here today.     Any worsening of the general feeling of unwellness.  IMPORTANCE OF PRIMARY CARE DOCTOR (EDU)  IMPORTANCE OF PRIMARY CARE DOCTOR (EDU): You have been given instructions to follow up with a primary care physician.  A primary care physician is a doctor who helps with your health maintenance. For example, he or she provides yearly health exams to help determine your general well-being along with regular check-ups to help to identify potential health problems.  Your primary care physician serves as a main resource on all aspects of your health. In addition to treating existing medical conditions, this physician monitors your health over time. Your primary doctor can help you to recognize symptoms, or changes in your body that could be signs of new illness. Primary care physicians can look at the big picture, including your lifestyle and family history. They can help plan the best ways of staying healthy and leading a long, productive life.  They are also an important part in making referrals to specialists (such as doctors who specialize in specific disease conditions such as diabetes, heart disease, etc.).  There are many types of physicians who provide primary care. They all offer the benefits of a lasting, personal relationship based upon mutual trust and a thorough knowledge of an individual person. They also provide a wide range of healthcare services.      Family Medicine physicians provide comprehensive care for all family members, from newborns through older adults.     Internal Medicine physicians specialize in meeting the complete healthcare needs of adults, from teenagers through seniors, providing both primary and advanced levels of care.     Obstetrician/gynecologists often serve as primary physicians for women, performing routine physicals and health screenings in addition to obstetrical and gynecological care.     Pediatricians are experts in primary care for children, usually from infancy through the teen years. Primary care doctors may be either MDs or DOs. With today's modern medical training, the differences between an MD (Medical Doctor) and a D.O. (Doctor of Osteopathic Medicine) are minimal. Both MD's and DOs go to medical school and complete residencies in various medical specialties.  If you do not have a primary care physician, it takes a little homework and determination on your part. There are several options available in selecting the most appropriate doctor for your care. There are referrals lines in your local area as well as specialists that work with your specific health care plan. Many people find a physician through word-of-mouth, asking their friends, neighbors or relatives. There are also referral lines in your local area. Hospital physician referral services are also another option. Your health care plan may also offer referral services and most health plans offer the "Ask A Nurse" service. Referral  services offer backgrounds of potential physicians, their educational and practice history, age range, office locations and hours, and the types of insurance coverage that they accept.  When you have decided which doctor may be right for you, make an appointment to ask questions about issues that are important to you. Frequently asked questions include the following:      Is the doctor on staff at a hospital? Which hospital?     What is the doctor's educational background?     Does the  doctor specialize in certain areas of medicine?     How many years has his or her practice been established?     Is the doctor in practice by himself or herself, or in a group practice?     Is his or her office conveniently located?     What hours are available for appointments?     What types of insurance coverage does the doctor accept?     If you're on Medicare or Medicaid, does the doctor accept these plans?     How far in advance do you have to make an appointment? Are same-day appointments available?     How does the doctor handle situations when you need to see a doctor urgently?     What is the doctor's fee schedule? When is payment expected and how can it be made? When seeing a patient for the first time in a non-emergency situation, most doctors will begin a medical chart. This chart includes information about your health history. This record should include your present state of health, personal statistics (age, height, weight, occupation, whether you're a smoker or non-smoker), and your family history.  Establishing a GOOD RAPPORT (relationship) with your family doctor is EXTREMELY important! A PCP (Primary Care Physician) is the cornerstone of your care and should be the first person you call with any health concerns or problems. Being an established patient is VERY important so that you can be seen quickly when an illness or injury does occur. Plan ahead and make an appointment with your  chosen physician to become an established patient of his or her practice.   DIARRHEA  DIARRHEA: You have been diagnosed with diarrhea.  Diarrhea occurs when you have an increase in the amount or frequency of stools (bowel movements) and/or your stools become very soft or completely liquid. You may also have stomach cramps/ pains, nausea, vomiting and fever.  Diarrhea can be caused by bacteria, viruses and by parasites. Those who have recently returned from travel to other countries may have a bacterial cause of diarrhea called "travelers diarrhea."  The main treatments for diarrhea include hydration with fluids so you don't become dehydrated from the loss of body fluids. You may also be given medicines to slow the diarrhea and stop the vomiting (if you have this symptom) .  You should drink lots of natural juices or a sports-hydration type drink that contains electrolytes. Avoid drinking a large amount of plain water. Sugary fluids and juices such as apple and pear may worsen the diarrhea.  Depending on your age, medical history and symptoms, you may be prescribed medications to help with the nausea, vomiting, stomach cramps, and the diarrhea itself.  At this time it has been determined it is safe for you to be discharged.  Good hygiene is important to avoid spread of this disease. Please wash your hands thoroughly and frequently with soap and water, especially after using the bathroom. Avoid food preparation and the sharing of utensils or food and beverages with others.  Follow-up with your primary care doctor or a specialist is very important. Please do so within the time frame specified by the physician.  YOU SHOULD SEEK MEDICAL ATTENTION IMMEDIATELY, EITHER HERE OR AT THE NEAREST EMERGENCY DEPARTMENT, IF ANY OF THE FOLLOWING OCCURS:      Vomiting and inability to keep down fluids.     Blood, pus or mucous in your stool.     Signs of dehydration such as dry mouth, not urinating at  least once every eight hours, dizziness/lightheadedness, severe weakness or passing out.  B.R.A.T. DIET  B.R.A.T. Diet Your doctor has recommended the B.R.A.T. diet for your child until his or her condition improves. This is often used to help control diarrhea and vomiting symptoms. If your child can tolerate clear liquids, you may offer:       Bananas       Rice       Applesauce       Toast (and other simple starches such as crackers, potatoes, noodles)   Be sure to avoid dairy products, meats, and fatty foods until your child's  symptoms are completely better.

## 2015-05-20 NOTE — ED Notes (Signed)
Pt verbalized understanding of d/c instructions and to follow up with obgyn about ongoing abdominal pain, pt stable and NAD.

## 2015-05-20 NOTE — ED Notes (Signed)
Pt offered food and stated that she would like a [place] sandwich.

## 2015-07-25 ENCOUNTER — Emergency Department
Admission: EM | Admit: 2015-07-25 | Discharge: 2015-07-26 | Disposition: A | Discharge status: Home / Self Care | Payer: Medicaid Other | Attending: Emergency Medicine | Admitting: Emergency Medicine

## 2015-07-25 ENCOUNTER — Encounter: Payer: Self-pay | Admitting: Emergency Medicine

## 2015-07-25 DIAGNOSIS — Z3202 Encounter for pregnancy test, result negative: Secondary | ICD-10-CM | POA: Diagnosis not present

## 2015-07-25 DIAGNOSIS — R109 Unspecified abdominal pain: Secondary | ICD-10-CM | POA: Diagnosis present

## 2015-07-25 DIAGNOSIS — N10 Acute tubulo-interstitial nephritis: Secondary | ICD-10-CM | POA: Diagnosis not present

## 2015-07-25 LAB — CBC WITH DIFFERENTIAL/PLATELET
BASOS ABS: 0 10*3/uL (ref 0–0.1)
BASOS PCT: 0 %
EOS ABS: 0.1 10*3/uL (ref 0–0.7)
EOS PCT: 1 %
HCT: 39.4 % (ref 35.0–47.0)
HEMOGLOBIN: 12.9 g/dL (ref 12.0–16.0)
LYMPHS ABS: 2.9 10*3/uL (ref 1.0–3.6)
Lymphocytes Relative: 25 %
MCH: 29.1 pg (ref 26.0–34.0)
MCHC: 32.8 g/dL (ref 32.0–36.0)
MCV: 88.5 fL (ref 80.0–100.0)
Monocytes Absolute: 1.1 10*3/uL — ABNORMAL HIGH (ref 0.2–0.9)
Monocytes Relative: 10 %
NEUTROS PCT: 64 %
Neutro Abs: 7.4 10*3/uL — ABNORMAL HIGH (ref 1.4–6.5)
PLATELETS: 180 10*3/uL (ref 150–440)
RBC: 4.45 MIL/uL (ref 3.80–5.20)
RDW: 13.1 % (ref 11.5–14.5)
WBC: 11.6 10*3/uL — AB (ref 3.6–11.0)

## 2015-07-25 LAB — POCT PREGNANCY, URINE: PREG TEST UR: NEGATIVE

## 2015-07-25 LAB — COMPREHENSIVE METABOLIC PANEL
ALBUMIN: 3.7 g/dL (ref 3.5–5.0)
ALK PHOS: 85 U/L (ref 38–126)
ALT: 73 U/L — AB (ref 14–54)
AST: 59 U/L — AB (ref 15–41)
Anion gap: 8 (ref 5–15)
BUN: 12 mg/dL (ref 6–20)
CALCIUM: 8.7 mg/dL — AB (ref 8.9–10.3)
CHLORIDE: 109 mmol/L (ref 101–111)
CO2: 20 mmol/L — AB (ref 22–32)
CREATININE: 0.84 mg/dL (ref 0.44–1.00)
GFR calc Af Amer: >60 mL/min (ref >60)
GFR calc non Af Amer: >60 mL/min (ref >60)
GLUCOSE: 94 mg/dL (ref 65–99)
Potassium: 4.5 mmol/L (ref 3.5–5.1)
SODIUM: 137 mmol/L (ref 135–145)
Total Bilirubin: 1.2 mg/dL (ref 0.3–1.2)
Total Protein: 7.4 g/dL (ref 6.5–8.1)

## 2015-07-25 LAB — URINALYSIS COMPLETE WITH MICROSCOPIC (ARMC ONLY)
Bilirubin Urine: NEGATIVE
GLUCOSE, UA: NEGATIVE mg/dL
HGB URINE DIPSTICK: NEGATIVE
Ketones, ur: NEGATIVE mg/dL
Leukocytes, UA: NEGATIVE
NITRITE: NEGATIVE
PROTEIN: NEGATIVE mg/dL
RBC / HPF: NONE SEEN RBC/hpf (ref 0–5)
Specific Gravity, Urine: 1.008 (ref 1.005–1.030)
pH: 7 (ref 5.0–8.0)

## 2015-07-25 LAB — LIPASE, BLOOD: Lipase: 30 U/L (ref 11–51)

## 2015-07-25 MED ORDER — KETOROLAC TROMETHAMINE 60 MG/2ML IM SOLN
60.0000 mg | Freq: Once | INTRAMUSCULAR | Status: AC
  Administered 2015-07-26: 60 mg via INTRAMUSCULAR
  Filled 2015-07-25: qty 2

## 2015-07-25 NOTE — ED Notes (Signed)
MD at bedside for eval.

## 2015-07-25 NOTE — ED Notes (Signed)
Pt presents to ER with c/o of lower right abdomen pain; and flank pain that started this morning. Pt states stabbing pressure that hurts badly even when she urinates. 9/10 pain.

## 2015-07-26 MED ORDER — CIPROFLOXACIN HCL 500 MG PO TABS: 500.0000 mg | ORAL_TABLET | Freq: Two times a day (BID) | ORAL | Status: AC

## 2015-07-26 MED ORDER — PHENAZOPYRIDINE HCL 200 MG PO TABS: 200.0000 mg | ORAL_TABLET | Freq: Three times a day (TID) | ORAL | Status: DC | PRN

## 2015-07-26 MED ORDER — PHENAZOPYRIDINE HCL 200 MG PO TABS
200.0000 mg | ORAL_TABLET | Freq: Once | ORAL | Status: AC
  Administered 2015-07-26: 200 mg via ORAL
  Filled 2015-07-26: qty 1

## 2015-07-26 MED ORDER — CIPROFLOXACIN HCL 500 MG PO TABS
500.0000 mg | ORAL_TABLET | Freq: Once | ORAL | Status: AC
  Administered 2015-07-26: 500 mg via ORAL
  Filled 2015-07-26: qty 1

## 2015-07-26 NOTE — Discharge Instructions (Signed)

## 2015-07-26 NOTE — ED Provider Notes (Signed)
[facility] Emergency Department Provider Note  ____________________________________________  Time seen: 11:30 PM  I have reviewed the triage vital signs and the nursing notes.   HISTORY  Chief Complaint Flank Pain      HPI Kristi Gutierrez is a 34 y.o. female presents with right flank pain with onset this morning. Patient ascribes the pain is 9 out of 10 with pressure and discomfort with urination. Patient denies any fever no nausea vomiting or diarrhea.    Past Medical History  Diagnosis Date  . Pseudotumor cerebri   . Migraine     Patient Active Problem List   Diagnosis Date Noted  . Admission for sterilization 03/01/2015    Past Surgical History  Procedure Laterality Date  . Cyst excision    . Attempted btl-had to abort procedure  10-2014  . Mouth surgery    . Laparoscopic tubal ligation Bilateral 03/01/2015    Procedure: LAPAROSCOPIC TUBAL LIGATION;  Surgeon: Will Bonnet, MD;  Location: [facility] ORS;  Service: Gynecology;  Laterality: Bilateral;    Current Outpatient Rx  Name  Route  Sig  Dispense  Refill  . dicyclomine (BENTYL) 20 MG tablet   Oral   Take 1 tablet (20 mg total) by mouth every 6 (six) hours as needed for spasms (for abdominal cramping).   20 tablet   0   . norgestimate-ethinyl estradiol (SPRINTEC 28) 0.25-35 MG-MCG tablet   Oral   Take 1 tablet by mouth daily. Patient not taking: Reported on 05/19/2015   1 Package   0   . ondansetron (ZOFRAN) 4 MG tablet      Take 1-2 tabs by mouth every 8 hours as needed for nausea/vomiting   30 tablet   0   . oxyCODONE-acetaminophen (PERCOCET/ROXICET) 5-325 MG per tablet   Oral   Take 1-2 tablets by mouth every 6 (six) hours as needed for severe pain.   12 tablet   0   . phenazopyridine (PYRIDIUM) 95 MG tablet   Oral   Take 2 tablets (190 mg total) by mouth 3 (three) times daily as needed for pain. Patient not taking: Reported on 05/19/2015   12 tablet   0   .  promethazine (PHENERGAN) 25 MG tablet   Oral   Take 1 tablet (25 mg total) by mouth every 6 (six) hours as needed for nausea.   30 tablet   0     Allergies No known drug allergies History reviewed. No pertinent family history.  Social History Social History  Substance Use Topics  . Smoking status: Never Smoker   . Smokeless tobacco: Never Used  . Alcohol Use: No     Comment: occ    Review of Systems  Constitutional: Negative for fever. Eyes: Negative for visual changes. ENT: Negative for sore throat. Cardiovascular: Negative for chest pain. Respiratory: Negative for shortness of breath. Gastrointestinal: Positive right flank pain Genitourinary: Positive for dysuria. Musculoskeletal: Negative for back pain. Skin: Negative for rash. Neurological: Negative for headaches, focal weakness or numbness.   10-point ROS otherwise negative.  ____________________________________________   PHYSICAL EXAM:  VITAL SIGNS: ED Triage Vitals  Enc Vitals Group     BP 07/25/15 2115 133/64 mmHg     Pulse Rate 07/25/15 2115 95     Resp 07/25/15 2115 20     Temp 07/25/15 2115 97.9 F (36.6 C)     Temp Source 07/25/15 2115 Oral     SpO2 07/25/15 2115 99 %     Weight  07/25/15 2115 247 lb (112.038 kg)     Height 07/25/15 2115 5' 6.5" (1.689 m)     Head Cir --      Peak Flow --      Pain Score 07/25/15 2116 7     Pain Loc --      Pain Edu? --      Excl. in [place]? --      Constitutional: Alert and oriented. Well appearing and in no distress. Eyes: Conjunctivae are normal. PERRL. Normal extraocular movements. ENT   Head: Normocephalic and atraumatic.   Nose: No congestion/rhinnorhea.   Mouth/Throat: Mucous membranes are moist.   Neck: No stridor. Hematological/Lymphatic/Immunilogical: No cervical lymphadenopathy. Cardiovascular: Normal rate, regular rhythm. Normal and symmetric distal pulses are present in all extremities. No murmurs, rubs, or gallops. Respiratory:  Normal respiratory effort without tachypnea nor retractions. Breath sounds are clear and equal bilaterally. No wheezes/rales/rhonchi. Gastrointestinal: Soft and nontender. No distention. Positive for right  CVA tenderness. Genitourinary: deferred Musculoskeletal: Nontender with normal range of motion in all extremities. No joint effusions.  No lower extremity tenderness nor edema. Neurologic:  Normal speech and language. No gross focal neurologic deficits are appreciated. Speech is normal.  Skin:  Skin is warm, dry and intact. No rash noted. Psychiatric: Mood and affect are normal. Speech and behavior are normal. Patient exhibits appropriate insight and judgment.  ____________________________________________    LABS (pertinent positives/negatives)  Labs Reviewed  URINALYSIS COMPLETEWITH MICROSCOPIC ([facility] ONLY) - Abnormal; Notable for the following:    Color, Urine STRAW (*)    APPearance CLEAR (*)    Bacteria, UA MANY (*)    Squamous Epithelial / LPF 0-5 (*)    All other components within normal limits  CBC WITH DIFFERENTIAL/PLATELET - Abnormal; Notable for the following:    WBC 11.6 (*)    Neutro Abs 7.4 (*)    Monocytes Absolute 1.1 (*)    All other components within normal limits  COMPREHENSIVE METABOLIC PANEL - Abnormal; Notable for the following:    CO2 20 (*)    Calcium 8.7 (*)    AST 59 (*)    ALT 73 (*)    All other components within normal limits  LIPASE, BLOOD  POC URINE PREG, ED  POCT PREGNANCY, URINE       INITIAL IMPRESSION / ASSESSMENT AND PLAN / ED COURSE  Pertinent labs & imaging results that were available during my care of the patient were reviewed by me and considered in my medical decision making (see chart for details).  History of physical exam consistent with pyelonephritis as such patient received ciprofloxacin 500 mg as well as Pyridium. Review of the patient's history revealed 3 CAT scans and 3 ultrasounds performed in the last 6 months for the  same complaint as such repeat imaging not performed today  ____________________________________________   FINAL CLINICAL IMPRESSION(S) / ED DIAGNOSES  Final diagnoses:  Acute pyelonephritis      Gregor Hams, MD 07/26/15 0006

## 2015-08-26 ENCOUNTER — Emergency Department
Admission: EM | Admit: 2015-08-26 | Discharge: 2015-08-26 | Disposition: A | Discharge status: Home / Self Care | Payer: Medicaid Other | Attending: Emergency Medicine | Admitting: Emergency Medicine

## 2015-08-26 ENCOUNTER — Emergency Department: Payer: Medicaid Other

## 2015-08-26 ENCOUNTER — Encounter: Payer: Self-pay | Admitting: Emergency Medicine

## 2015-08-26 DIAGNOSIS — M545 Low back pain: Secondary | ICD-10-CM | POA: Diagnosis present

## 2015-08-26 DIAGNOSIS — M5442 Lumbago with sciatica, left side: Secondary | ICD-10-CM | POA: Diagnosis not present

## 2015-08-26 DIAGNOSIS — Z79899 Other long term (current) drug therapy: Secondary | ICD-10-CM | POA: Insufficient documentation

## 2015-08-26 DIAGNOSIS — M5440 Lumbago with sciatica, unspecified side: Secondary | ICD-10-CM

## 2015-08-26 DIAGNOSIS — M5441 Lumbago with sciatica, right side: Secondary | ICD-10-CM | POA: Insufficient documentation

## 2015-08-26 DIAGNOSIS — M5431 Sciatica, right side: Secondary | ICD-10-CM

## 2015-08-26 DIAGNOSIS — M5432 Sciatica, left side: Secondary | ICD-10-CM

## 2015-08-26 MED ORDER — LIDOCAINE 5 % EX PTCH
1.0000 | MEDICATED_PATCH | CUTANEOUS | Status: DC
  Administered 2015-08-26: 1 via TRANSDERMAL
  Filled 2015-08-26: qty 1

## 2015-08-26 MED ORDER — MELOXICAM 15 MG PO TABS: 15.0000 mg | ORAL_TABLET | Freq: Every day | ORAL | Status: DC

## 2015-08-26 MED ORDER — KETOROLAC TROMETHAMINE 60 MG/2ML IM SOLN
60.0000 mg | Freq: Once | INTRAMUSCULAR | Status: AC
  Administered 2015-08-26: 60 mg via INTRAMUSCULAR
  Filled 2015-08-26: qty 2

## 2015-08-26 MED ORDER — OXYCODONE-ACETAMINOPHEN 5-325 MG PO TABS
1.0000 | ORAL_TABLET | Freq: Once | ORAL | Status: AC
  Administered 2015-08-26: 1 via ORAL
  Filled 2015-08-26: qty 1

## 2015-08-26 NOTE — ED Notes (Signed)
Presents with lower back pain which radiates into both legs  Denies any injury

## 2015-08-26 NOTE — Discharge Instructions (Signed)
Back Exercises °The following exercises strengthen the muscles that help to support the back. They also help to keep the lower back flexible. Doing these exercises can help to prevent back pain or lessen existing pain. °If you have back pain or discomfort, try doing these exercises 2-3 times each day or as told by your health care provider. When the pain goes away, do them once each day, but increase the number of times that you repeat the steps for each exercise (do more repetitions). If you do not have back pain or discomfort, do these exercises once each day or as told by your health care provider. °EXERCISES °Single Knee to Chest °Repeat these steps 3-5 times for each leg: °1. Lie on your back on a firm bed or the floor with your legs extended. °2. Bring one knee to your chest. Your other leg should stay extended and in contact with the floor. °3. Hold your knee in place by grabbing your knee or thigh. °4. Pull on your knee until you feel a gentle stretch in your lower back. °5. Hold the stretch for 10-30 seconds. °6. Slowly release and straighten your leg. °Pelvic Tilt °Repeat these steps 5-10 times: °1. Lie on your back on a firm bed or the floor with your legs extended. °2. Bend your knees so they are pointing toward the ceiling and your feet are flat on the floor. °3. Tighten your lower abdominal muscles to press your lower back against the floor. This motion will tilt your pelvis so your tailbone points up toward the ceiling instead of pointing to your feet or the floor. °4. With gentle tension and even breathing, hold this position for 5-10 seconds. °Cat-Cow °Repeat these steps until your lower back becomes more flexible: °1. Get into a hands-and-knees position on a firm surface. Keep your hands under your shoulders, and keep your knees under your hips. You may place padding under your knees for comfort. °2. Let your head hang down, and point your tailbone toward the floor so your lower back becomes  rounded like the back of a cat. °3. Hold this position for 5 seconds. °4. Slowly lift your head and point your tailbone up toward the ceiling so your back forms a sagging arch like the back of a cow. °5. Hold this position for 5 seconds. °Press-Ups °Repeat these steps 5-10 times: °1. Lie on your abdomen (face-down) on the floor. °2. Place your palms near your head, about shoulder-width apart. °3. While you keep your back as relaxed as possible and keep your hips on the floor, slowly straighten your arms to raise the top half of your body and lift your shoulders. Do not use your back muscles to raise your upper torso. You may adjust the placement of your hands to make yourself more comfortable. °4. Hold this position for 5 seconds while you keep your back relaxed. °5. Slowly return to lying flat on the floor. °Bridges °Repeat these steps 10 times: °1. Lie on your back on a firm surface. °2. Bend your knees so they are pointing toward the ceiling and your feet are flat on the floor. °3. Tighten your buttocks muscles and lift your buttocks off of the floor until your waist is at almost the same height as your knees. You should feel the muscles working in your buttocks and the back of your thighs. If you do not feel these muscles, slide your feet 1-2 inches farther away from your buttocks. °4. Hold this position for 3-5   seconds. 5. Slowly lower your hips to the starting position, and allow your buttocks muscles to relax completely. If this exercise is too easy, try doing it with your arms crossed over your chest. Abdominal Crunches Repeat these steps 5-10 times: 1. Lie on your back on a firm bed or the floor with your legs extended. 2. Bend your knees so they are pointing toward the ceiling and your feet are flat on the floor. 3. Cross your arms over your chest. 4. Tip your chin slightly toward your chest without bending your neck. 5. Tighten your abdominal muscles and slowly raise your trunk (torso) high  enough to lift your shoulder blades a tiny bit off of the floor. Avoid raising your torso higher than that, because it can put too much stress on your low back and it does not help to strengthen your abdominal muscles. 6. Slowly return to your starting position. Back Lifts Repeat these steps 5-10 times: 1. Lie on your abdomen (face-down) with your arms at your sides, and rest your forehead on the floor. 2. Tighten the muscles in your legs and your buttocks. 3. Slowly lift your chest off of the floor while you keep your hips pressed to the floor. Keep the back of your head in line with the curve in your back. Your eyes should be looking at the floor. 4. Hold this position for 3-5 seconds. 5. Slowly return to your starting position. SEEK MEDICAL CARE IF:  Your back pain or discomfort gets much worse when you do an exercise.  Your back pain or discomfort does not lessen within 2 hours after you exercise. If you have any of these problems, stop doing these exercises right away. Do not do them again unless your health care provider says that you can. SEEK IMMEDIATE MEDICAL CARE IF:  You develop sudden, severe back pain. If this happens, stop doing the exercises right away. Do not do them again unless your health care provider says that you can.   This information is not intended to replace advice given to you by your health care provider. Make sure you discuss any questions you have with your health care provider.  Radicular Pain  Radicular pain in either the arm or leg is usually from a bulging or herniated disk in the spine. A piece of the herniated disk may press against the nerves as the nerves exit the spine. This causes pain which is felt at the tips of the nerves down the arm or leg. Other causes of radicular pain may include:  Fractures.  Heart disease.  Cancer.  An abnormal and usually degenerative state of the nervous system or nerves (neuropathy). Diagnosis may require CT or MRI  scanning to determine the primary cause.  Nerves that start at the neck (nerve roots) may cause radicular pain in the outer shoulder and arm. It can spread down to the thumb and fingers. The symptoms vary depending on which nerve root has been affected. In most cases radicular pain improves with conservative treatment. Neck problems may require physical therapy, a neck collar, or cervical traction. Treatment may take many weeks, and surgery may be considered if the symptoms do not improve.  Conservative treatment is also recommended for sciatica. Sciatica causes pain to radiate from the lower back or buttock area down the leg into the foot. Often there is a history of back problems. Most patients with sciatica are better after 2 to 4 weeks of rest and other supportive care. Short term bed  rest can reduce the disk pressure considerably. Sitting, however, is not a good position since this increases the pressure on the disk. You should avoid bending, lifting, and all other activities which make the problem worse. Traction can be used in severe cases. Surgery is usually reserved for patients who do not improve within the first months of treatment.  Only take over-the-counter or prescription medicines for pain, discomfort, or fever as directed by your caregiver. Narcotics and muscle relaxants may help by relieving more severe pain and spasm and by providing mild sedation. Cold or massage can give significant relief. Spinal manipulation is not recommended. It can increase the degree of disc protrusion. Epidural steroid injections are often effective treatment for radicular pain. These injections deliver medicine to the spinal nerve in the space between the protective covering of the spinal cord and back bones (vertebrae). Your caregiver can give you more information about steroid injections. These injections are most effective when given within two weeks of the onset of pain.  You should see your caregiver for follow  up care as recommended. A program for neck and back injury rehabilitation with stretching and strengthening exercises is an important part of management.  SEEK IMMEDIATE MEDICAL CARE IF:  You develop increased pain, weakness, or numbness in your arm or leg.  You develop difficulty with bladder or bowel control.  You develop abdominal pain. This information is not intended to replace advice given to you by your health care provider. Make sure you discuss any questions you have with your health care provider.  Document Released: 10/18/2004 Document Revised: 10/01/2014 Document Reviewed: 04/06/2015  Elsevier Interactive Patient Education 2016 [place] Released: 10/18/2004 Document Revised: 06/01/2015 Document Reviewed: 11/04/2014 Elsevier Interactive Patient Education [employer].

## 2015-08-26 NOTE — ED Provider Notes (Signed)
[facility] Emergency Department Provider Note  ____________________________________________  Time seen: Approximately 1:13 PM  I have reviewed the triage vital signs and the nursing notes.   HISTORY  Chief Complaint Back Pain    HPI Kristi Gutierrez is a 34 y.o. female who presents emergency department complaining of severe lower back pain. She denies injury leading up to this. She states that the symptoms began last night. She states the pain is in the lumbar region radiating down both of her legs. She denies any saddle anesthesia or bowel or bladder dysfunction. She denies any urinary symptoms. She denies any nausea or vomiting. Patient states that is "very painful" and constant, and nothing is making it better. When asked to describe the sensation all the patient will respond with was "it hurts".   Past Medical History  Diagnosis Date  . Pseudotumor cerebri   . Migraine     Patient Active Problem List   Diagnosis Date Noted  . Admission for sterilization 03/01/2015    Past Surgical History  Procedure Laterality Date  . Cyst excision    . Attempted btl-had to abort procedure  10-2014  . Mouth surgery    . Laparoscopic tubal ligation Bilateral 03/01/2015    Procedure: LAPAROSCOPIC TUBAL LIGATION;  Surgeon: Will Bonnet, MD;  Location: [facility] ORS;  Service: Gynecology;  Laterality: Bilateral;    Current Outpatient Rx  Name  Route  Sig  Dispense  Refill  . dicyclomine (BENTYL) 20 MG tablet   Oral   Take 1 tablet (20 mg total) by mouth every 6 (six) hours as needed for spasms (for abdominal cramping).   20 tablet   0   . meloxicam (MOBIC) 15 MG tablet   Oral   Take 1 tablet (15 mg total) by mouth daily.   30 tablet   0   . norgestimate-ethinyl estradiol (SPRINTEC 28) 0.25-35 MG-MCG tablet   Oral   Take 1 tablet by mouth daily. Patient not taking: Reported on 05/19/2015   1 Package   0   . ondansetron (ZOFRAN) 4 MG tablet      Take 1-2  tabs by mouth every 8 hours as needed for nausea/vomiting   30 tablet   0   . oxyCODONE-acetaminophen (PERCOCET/ROXICET) 5-325 MG per tablet   Oral   Take 1-2 tablets by mouth every 6 (six) hours as needed for severe pain.   12 tablet   0   . phenazopyridine (PYRIDIUM) 200 MG tablet   Oral   Take 1 tablet (200 mg total) by mouth 3 (three) times daily as needed for pain.   10 tablet   0   . phenazopyridine (PYRIDIUM) 95 MG tablet   Oral   Take 2 tablets (190 mg total) by mouth 3 (three) times daily as needed for pain. Patient not taking: Reported on 05/19/2015   12 tablet   0   . promethazine (PHENERGAN) 25 MG tablet   Oral   Take 1 tablet (25 mg total) by mouth every 6 (six) hours as needed for nausea.   30 tablet   0     Allergies Review of patient's allergies indicates no known allergies.  No family history on file.  Social History Social History  Substance Use Topics  . Smoking status: Never Smoker   . Smokeless tobacco: Never Used  . Alcohol Use: No     Comment: occ    Review of Systems Constitutional: No fever/chills Eyes: No visual changes. ENT: No sore  throat. Cardiovascular: Denies chest pain. Respiratory: Denies shortness of breath. Gastrointestinal: No abdominal pain.  No nausea, no vomiting.  No diarrhea.  No constipation. Genitourinary: Negative for dysuria. Musculoskeletal: Nurse's back pain. Skin: Negative for rash. Neurological: Negative for headaches, focal weakness or numbness.  10-point ROS otherwise negative.  ____________________________________________   PHYSICAL EXAM:  VITAL SIGNS: ED Triage Vitals  Enc Vitals Group     BP 08/26/15 1213 119/71 mmHg     Pulse Rate 08/26/15 1213 82     Resp 08/26/15 1213 18     Temp 08/26/15 1213 97.7 F (36.5 C)     Temp Source 08/26/15 1213 Oral     SpO2 08/26/15 1213 98 %     Weight 08/26/15 1213 240 lb (108.863 kg)     Height 08/26/15 1213 5\' 6"  (1.676 m)     Head Cir --      Peak  Flow --      Pain Score 08/26/15 1148 10     Pain Loc --      Pain Edu? --      Excl. in [place]? --     Constitutional: Alert and oriented. Well appearing and in no acute distress. Eyes: Conjunctivae are normal. PERRL. EOMI. Head: Atraumatic. Nose: No congestion/rhinnorhea. Mouth/Throat: Mucous membranes are moist.  Oropharynx non-erythematous. Neck: No stridor.  No cervical spine tenderness to palpation. Cardiovascular: Normal rate, regular rhythm. Grossly normal heart sounds.  Good peripheral circulation. Respiratory: Normal respiratory effort.  No retractions. Lungs CTAB. Gastrointestinal: Soft and nontender. No distention. No abdominal bruits. No CVA tenderness. Musculoskeletal: No lower extremity tenderness nor edema.  No joint effusions. No visible deformity to lower back upon inspection. Patient is diffusely tender to palpation midline as well as over the paraspinal muscle groups. No palpable abnormality. Tenderness to palpation over the sciatic notch bilaterally. Positive straight leg raise bilaterally. Sensation and pulses intact distally. Neurologic:  Normal speech and language. No gross focal neurologic deficits are appreciated. No gait instability. Skin:  Skin is warm, dry and intact. No rash noted. Psychiatric: Mood and affect are normal. Speech and behavior are normal.  ____________________________________________   LABS (all labs ordered are listed, but only abnormal results are displayed)  Labs Reviewed - No data to display ____________________________________________  EKG   ____________________________________________  RADIOLOGY  Lumbar spine x-ray Impression: No evidence of lumbar spine fracture. Alignment is normal.   X-rays were personally reviewed by myself. ____________________________________________   PROCEDURES  Procedure(s) performed: None  Critical Care performed: No  ____________________________________________   INITIAL IMPRESSION /  ASSESSMENT AND PLAN / ED COURSE  Pertinent labs & imaging results that were available during my care of the patient were reviewed by me and considered in my medical decision making (see chart for details).  Agents history, symptoms, physical exam are consistent with lumbago with bilateral sciatica. I advised patient of findings and diagnosis and she verbalizes understanding of same. The patient will be placed on meloxicam for symptomatic control. Patient was given IM Toradol here in the emergency department as well as 1 Percocet as well as a lidocaine patch. Patient will be discharged home with instructions to continue anti-inflammatories and to follow-up with orthopedics should symptoms persist. Patient verbalizes understanding of diagnosis and treatment plan and verbalizes compliance with same. ____________________________________________   FINAL CLINICAL IMPRESSION(S) / ED DIAGNOSES  Final diagnoses:  Midline low back pain with sciatica, sciatica laterality unspecified  Bilateral sciatica      Darletta Moll, PA-C 08/26/15 1514  Earleen Newport, MD 08/26/15 [phone]

## 2015-09-28 ENCOUNTER — Emergency Department
Admission: EM | Admit: 2015-09-28 | Discharge: 2015-09-28 | Disposition: A | Discharge status: Home / Self Care | Payer: Medicaid Other | Attending: Emergency Medicine | Admitting: Emergency Medicine

## 2015-09-28 DIAGNOSIS — L03012 Cellulitis of left finger: Secondary | ICD-10-CM

## 2015-09-28 DIAGNOSIS — Z791 Long term (current) use of non-steroidal anti-inflammatories (NSAID): Secondary | ICD-10-CM | POA: Insufficient documentation

## 2015-09-28 DIAGNOSIS — Z79899 Other long term (current) drug therapy: Secondary | ICD-10-CM | POA: Diagnosis not present

## 2015-09-28 DIAGNOSIS — M79642 Pain in left hand: Secondary | ICD-10-CM | POA: Diagnosis present

## 2015-09-28 DIAGNOSIS — R51 Headache: Secondary | ICD-10-CM | POA: Insufficient documentation

## 2015-09-28 LAB — CBC WITH DIFFERENTIAL/PLATELET
BASOS ABS: 0 10*3/uL (ref 0–0.1)
BASOS PCT: 0 %
Eosinophils Absolute: 0.1 10*3/uL (ref 0–0.7)
Eosinophils Relative: 1 %
HEMATOCRIT: 38.8 % (ref 35.0–47.0)
Hemoglobin: 13.4 g/dL (ref 12.0–16.0)
LYMPHS PCT: 18 %
Lymphs Abs: 1.8 10*3/uL (ref 1.0–3.6)
MCH: 30 pg (ref 26.0–34.0)
MCHC: 34.5 g/dL (ref 32.0–36.0)
MCV: 87 fL (ref 80.0–100.0)
MONO ABS: 1.1 10*3/uL — AB (ref 0.2–0.9)
Monocytes Relative: 11 %
NEUTROS ABS: 6.8 10*3/uL — AB (ref 1.4–6.5)
NEUTROS PCT: 70 %
Platelets: 157 10*3/uL (ref 150–440)
RBC: 4.46 MIL/uL (ref 3.80–5.20)
RDW: 13.1 % (ref 11.5–14.5)
WBC: 9.8 10*3/uL (ref 3.6–11.0)

## 2015-09-28 MED ORDER — ONDANSETRON 8 MG PO TBDP
8.0000 mg | ORAL_TABLET | Freq: Once | ORAL | Status: AC
  Administered 2015-09-28: 8 mg via ORAL
  Filled 2015-09-28: qty 1

## 2015-09-28 MED ORDER — KETOROLAC TROMETHAMINE 60 MG/2ML IM SOLN
60.0000 mg | Freq: Once | INTRAMUSCULAR | Status: AC
  Administered 2015-09-28: 60 mg via INTRAMUSCULAR
  Filled 2015-09-28: qty 2

## 2015-09-28 MED ORDER — OXYCODONE-ACETAMINOPHEN 5-325 MG PO TABS: 1.0000 | ORAL_TABLET | ORAL | Status: DC | PRN

## 2015-09-28 MED ORDER — CEPHALEXIN 500 MG PO CAPS: 500.0000 mg | ORAL_CAPSULE | Freq: Four times a day (QID) | ORAL | Status: DC

## 2015-09-28 NOTE — ED Notes (Signed)
Pt states she started having pain in her left index finger states she [place] unable to move it at this time. Pt states her pain goes up to her shoulder and now has a headache.

## 2015-09-28 NOTE — ED Notes (Signed)
Pt states she feels the same as she did when she came in. Encouraged patient to go fill her prescriptions as soon as possible so that she can start to feel better.

## 2015-09-28 NOTE — Discharge Instructions (Signed)
Cellulitis °Cellulitis is an infection of the skin and the tissue beneath it. The infected area is usually red and tender. Cellulitis occurs most often in the arms and lower legs.  °CAUSES  °Cellulitis is caused by bacteria that enter the skin through cracks or cuts in the skin. The most common types of bacteria that cause cellulitis are staphylococci and streptococci. °SIGNS AND SYMPTOMS  °· Redness and warmth. °· Swelling. °· Tenderness or pain. °· Fever. °DIAGNOSIS  °Your health care provider can usually determine what is wrong based on a physical exam. Blood tests may also be done. °TREATMENT  °Treatment usually involves taking an antibiotic medicine. °HOME CARE INSTRUCTIONS  °· Take your antibiotic medicine as directed by your health care provider. Finish the antibiotic even if you start to feel better. °· Keep the infected arm or leg elevated to reduce swelling. °· Apply a warm cloth to the affected area up to 4 times per day to relieve pain. °· Take medicines only as directed by your health care provider. °· Keep all follow-up visits as directed by your health care provider. °SEEK MEDICAL CARE IF:  °· You notice red streaks coming from the infected area. °· Your red area gets larger or turns dark in color. °· Your bone or joint underneath the infected area becomes painful after the skin has healed. °· Your infection returns in the same area or another area. °· You notice a swollen bump in the infected area. °· You develop new symptoms. °· You have a fever. °SEEK IMMEDIATE MEDICAL CARE IF:  °· You feel very sleepy. °· You develop vomiting or diarrhea. °· You have a general ill feeling (malaise) with muscle aches and pains. °  °This information is not intended to replace advice given to you by your health care provider. Make sure you discuss any questions you have with your health care provider. °  °Document Released: 06/20/2005 Document Revised: 06/01/2015 Document Reviewed: 11/26/2011 °Elsevier Interactive  Patient Education ©2016 [employer] ° °Fingertip Infection °When an infection is around the nail, it is called a paronychia. When it appears over the tip of the finger, it is called a felon. These infections are due to minor injuries or cracks in the skin. If they are not treated properly, they can lead to bone infection and permanent damage to the fingernail. °Incision and drainage is necessary if a pus pocket (an abscess) has formed. Antibiotics and pain medicine may also be needed. Keep your hand elevated for the next 2-3 days to reduce swelling and pain. If a pack was placed in the abscess, it should be removed in 1-2 days by your caregiver. Soak the finger in warm water for 20 minutes 4 times daily to help promote drainage. °Keep the hands as dry as possible. Wear protective gloves with cotton liners. See your caregiver for follow-up care as recommended.  °HOME CARE INSTRUCTIONS  °· Keep wound clean, dry and dressed as suggested by your caregiver. °· Soak in warm salt water for fifteen minutes, four times per day for bacterial infections. °· Your caregiver will prescribe an antibiotic if a bacterial infection is suspected. Take antibiotics as directed and finish the prescription, even if the problem appears to be improving before the medicine is gone. °· Only take over-the-counter or prescription medicines for pain, discomfort, or fever as directed by your caregiver. °SEEK IMMEDIATE MEDICAL CARE IF: °· There is redness, swelling, or increasing pain in the wound. °· Pus or any other unusual drainage is coming   from the wound. °· An unexplained oral temperature above 102° F (38.9° C) develops. °· You notice a foul smell coming from the wound or dressing. °MAKE SURE YOU:  °· Understand these instructions. °· Monitor your condition. °· Contact your caregiver if you are getting worse or not improving. °  °This information is not intended to replace advice given to you by your health care provider. Make sure you  discuss any questions you have with your health care provider. °  °Document Released: 10/18/2004 Document Revised: 12/03/2011 Document Reviewed: 02/28/2015 °Elsevier Interactive Patient Education ©2016 [employer] ° °

## 2015-09-28 NOTE — ED Provider Notes (Signed)
[facility] Emergency Department Provider Note  ____________________________________________  Time seen: Approximately 11:36 AM  I have reviewed the triage vital signs and the nursing notes.   HISTORY  Chief Complaint Hand Pain    HPI Kristi Gutierrez is a 35 y.o. female presents for evaluation of left index finger pain. Patient states the pain radiates to her shoulder and has a headache. Patient states similar past medical history the same and diagnosed with cellulitis. Denies any trauma and does not desire x-rays at this time.   Past Medical History  Diagnosis Date  . Pseudotumor cerebri   . Migraine     Patient Active Problem List   Diagnosis Date Noted  . Admission for sterilization 03/01/2015    Past Surgical History  Procedure Laterality Date  . Cyst excision    . Attempted btl-had to abort procedure  10-2014  . Mouth surgery    . Laparoscopic tubal ligation Bilateral 03/01/2015    Procedure: LAPAROSCOPIC TUBAL LIGATION;  Surgeon: Will Bonnet, MD;  Location: [facility] ORS;  Service: Gynecology;  Laterality: Bilateral;  . Tubal ligation      Current Outpatient Rx  Name  Route  Sig  Dispense  Refill  . cephALEXin (KEFLEX) 500 MG capsule   Oral   Take 1 capsule (500 mg total) by mouth 4 (four) times daily.   40 capsule   0   . dicyclomine (BENTYL) 20 MG tablet   Oral   Take 1 tablet (20 mg total) by mouth every 6 (six) hours as needed for spasms (for abdominal cramping).   20 tablet   0   . meloxicam (MOBIC) 15 MG tablet   Oral   Take 1 tablet (15 mg total) by mouth daily.   30 tablet   0   . norgestimate-ethinyl estradiol (SPRINTEC 28) 0.25-35 MG-MCG tablet   Oral   Take 1 tablet by mouth daily. Patient not taking: Reported on 05/19/2015   1 Package   0   . ondansetron (ZOFRAN) 4 MG tablet      Take 1-2 tabs by mouth every 8 hours as needed for nausea/vomiting   30 tablet   0   . oxyCODONE-acetaminophen (ROXICET) 5-325 MG  tablet   Oral   Take 1-2 tablets by mouth every 4 (four) hours as needed for severe pain.   15 tablet   0   . promethazine (PHENERGAN) 25 MG tablet   Oral   Take 1 tablet (25 mg total) by mouth every 6 (six) hours as needed for nausea.   30 tablet   0     Allergies Review of patient's allergies indicates no known allergies.  No family history on file.  Social History Social History  Substance Use Topics  . Smoking status: Never Smoker   . Smokeless tobacco: Never Used  . Alcohol Use: No     Comment: occ    Review of Systems Constitutional: No fever/chills Eyes: No visual changes. ENT: No sore throat. Cardiovascular: Denies chest pain. Respiratory: Denies shortness of breath. Gastrointestinal: No abdominal pain.  No nausea, no vomiting.  No diarrhea.  No constipation. Genitourinary: Negative for dysuria. Musculoskeletal: Left index finger pain. Skin: Negative for rash. Neurological: Negative for headaches, focal weakness or numbness.  10-point ROS otherwise negative.  ____________________________________________   PHYSICAL EXAM:  VITAL SIGNS: ED Triage Vitals  Enc Vitals Group     BP 09/28/15 0924 121/69 mmHg     Pulse Rate 09/28/15 0924 90  Resp 09/28/15 0924 18     Temp 09/28/15 0924 99 F (37.2 C)     Temp Source 09/28/15 0924 Oral     SpO2 09/28/15 0924 98 %     Weight 09/28/15 0924 257 lb (116.574 kg)     Height 09/28/15 0924 5\' 7"  (1.702 m)     Head Cir --      Peak Flow --      Pain Score 09/28/15 0924 10     Pain Loc --      Pain Edu? --      Excl. in [place]? --     Constitutional: Alert and oriented. Well appearing and in no acute distress.  Cardiovascular: Normal rate, regular rhythm. Grossly normal heart sounds.  Good peripheral circulation. Respiratory: Normal respiratory effort.  No retractions. Lungs CTAB. Gastrointestinal: Soft and nontender. No distention. No abdominal bruits. No CVA tenderness. Musculoskeletal: No lower extremity  tenderness nor edema.  No joint effusions. Neurologic:  Normal speech and language. No gross focal neurologic deficits are appreciated. No gait instability. Skin:  Skin is warm, dry and intact. No rash noted. Mild erythema noted to the distal tip of the left index finger on the palmar aspect. Distally neurovascularly intact. Exquisite point tenderness to the fingertip. Psychiatric: Mood and affect are normal. Speech and behavior are normal.  ____________________________________________   LABS (all labs ordered are listed, but only abnormal results are displayed)  Labs Reviewed  CBC WITH DIFFERENTIAL/PLATELET - Abnormal; Notable for the following:    Neutro Abs 6.8 (*)    Monocytes Absolute 1.1 (*)    All other components within normal limits    RADIOLOGY  Deferred at this visit ____________________________________________   PROCEDURES  Procedure(s) performed: None  Critical Care performed: No  ____________________________________________   INITIAL IMPRESSION / ASSESSMENT AND PLAN / ED COURSE  Pertinent labs & imaging results that were available during my care of the patient were reviewed by me and considered in my medical decision making (see chart for details).  Probably early cellulitis to the left index finger. Rx given for Keflex 500 mg 4 times a day. Oxycodone 5/325. Patient to follow up with PCP or return to ER with any worsening symptomology. She voices no other emergency medical complaints at this time. ____________________________________________   FINAL CLINICAL IMPRESSION(S) / ED DIAGNOSES  Final diagnoses:  Cellulitis of index finger, left      Arlyss Repress, PA-C 09/28/15 1205  Carrie Mew, MD 09/28/15 [phone]

## 2015-09-28 NOTE — ED Notes (Signed)
Pt c/o left finger pain that started yesterday with swelling.

## 2015-10-01 ENCOUNTER — Emergency Department
Admission: EM | Admit: 2015-10-01 | Discharge: 2015-10-02 | Disposition: A | Discharge status: Home / Self Care | Payer: Medicaid Other | Attending: Emergency Medicine | Admitting: Emergency Medicine

## 2015-10-01 DIAGNOSIS — L03012 Cellulitis of left finger: Secondary | ICD-10-CM | POA: Diagnosis not present

## 2015-10-01 DIAGNOSIS — M7981 Nontraumatic hematoma of soft tissue: Secondary | ICD-10-CM | POA: Insufficient documentation

## 2015-10-01 DIAGNOSIS — Z791 Long term (current) use of non-steroidal anti-inflammatories (NSAID): Secondary | ICD-10-CM | POA: Insufficient documentation

## 2015-10-01 DIAGNOSIS — Z792 Long term (current) use of antibiotics: Secondary | ICD-10-CM | POA: Diagnosis not present

## 2015-10-01 DIAGNOSIS — M79645 Pain in left finger(s): Secondary | ICD-10-CM

## 2015-10-01 MED ORDER — OXYCODONE-ACETAMINOPHEN 5-325 MG PO TABS
1.0000 | ORAL_TABLET | Freq: Once | ORAL | Status: AC
  Administered 2015-10-01: 1 via ORAL
  Filled 2015-10-01: qty 1

## 2015-10-01 NOTE — ED Notes (Addendum)
Pt states left index finger since wendsday. Pt states has "extreme" pain to left index finger and extends up arm. Pt with small amount of swelling noted to base of nail with slight redness. No red streaks noted to arm. Pt states has had nausea with pain. Pt extends finger when asked, but screams in pain.

## 2015-10-02 ENCOUNTER — Emergency Department: Payer: Medicaid Other

## 2015-10-02 LAB — BASIC METABOLIC PANEL
ANION GAP: 11 (ref 5–15)
BUN: 12 mg/dL (ref 6–20)
CHLORIDE: 110 mmol/L (ref 101–111)
CO2: 18 mmol/L — ABNORMAL LOW (ref 22–32)
Calcium: 9.1 mg/dL (ref 8.9–10.3)
Creatinine, Ser: 0.72 mg/dL (ref 0.44–1.00)
GFR calc Af Amer: >60 mL/min (ref >60)
GFR calc non Af Amer: >60 mL/min (ref >60)
GLUCOSE: 106 mg/dL — AB (ref 65–99)
POTASSIUM: 4.3 mmol/L (ref 3.5–5.1)
Sodium: 139 mmol/L (ref 135–145)

## 2015-10-02 LAB — CBC WITH DIFFERENTIAL/PLATELET
Basophils Absolute: 0.1 10*3/uL (ref 0–0.1)
Basophils Relative: 1 %
Eosinophils Absolute: 0.1 10*3/uL (ref 0–0.7)
Eosinophils Relative: 1 %
HCT: 40.4 % (ref 35.0–47.0)
HEMOGLOBIN: 13.4 g/dL (ref 12.0–16.0)
LYMPHS ABS: 3.9 10*3/uL — AB (ref 1.0–3.6)
MCH: 29.8 pg (ref 26.0–34.0)
MCHC: 33.2 g/dL (ref 32.0–36.0)
MCV: 89.6 fL (ref 80.0–100.0)
Monocytes Absolute: 0.9 10*3/uL (ref 0.2–0.9)
Monocytes Relative: 10 %
NEUTROS ABS: 4.8 10*3/uL (ref 1.4–6.5)
Platelets: 148 10*3/uL — ABNORMAL LOW (ref 150–440)
RBC: 4.5 MIL/uL (ref 3.80–5.20)
RDW: 13.1 % (ref 11.5–14.5)
WBC: 9.8 10*3/uL (ref 3.6–11.0)

## 2015-10-02 LAB — SEDIMENTATION RATE: SED RATE: 26 mm/h — AB (ref 0–20)

## 2015-10-02 MED ORDER — DOCUSATE SODIUM 100 MG PO CAPS: ORAL_CAPSULE | ORAL | Status: DC

## 2015-10-02 MED ORDER — CEPHALEXIN 500 MG PO CAPS
500.0000 mg | ORAL_CAPSULE | Freq: Once | ORAL | Status: AC
  Administered 2015-10-02: 500 mg via ORAL
  Filled 2015-10-02: qty 1

## 2015-10-02 MED ORDER — DOXYCYCLINE HYCLATE 100 MG PO TABS
100.0000 mg | ORAL_TABLET | Freq: Once | ORAL | Status: AC
  Administered 2015-10-02: 100 mg via ORAL
  Filled 2015-10-02: qty 1

## 2015-10-02 MED ORDER — DOXYCYCLINE HYCLATE 100 MG PO CAPS: ORAL_CAPSULE | ORAL | Status: DC

## 2015-10-02 MED ORDER — OXYCODONE-ACETAMINOPHEN 5-325 MG PO TABS: 1.0000 | ORAL_TABLET | ORAL | Status: DC | PRN

## 2015-10-02 NOTE — ED Notes (Addendum)
Failed straight stick (x3), asking for help.  Patient tolerated well.

## 2015-10-02 NOTE — Discharge Instructions (Signed)
As we discussed, it is unclear at this time whether your left index finger pain is caused by an injury or an infection.  However, we want to be safe and treat you for an infection so that it does not get worse. Please take the full course of antibiotics you were given previously (four times per day!), and also take the full 10-day course of the doxycycline prescribed tonight.    Take Percocet as prescribed for severe pain. Do not drink alcohol, drive or participate in any other potentially dangerous activities while taking this medication as it may make you sleepy. Do not take this medication with any other sedating medications, either prescription or over-the-counter. If you were prescribed Percocet or Vicodin, do not take these with acetaminophen (Tylenol) as it is already contained within these medications.   This medication is an opiate (or narcotic) pain medication and can be habit forming.  Use it as little as possible to achieve adequate pain control.  Do not use or use it with extreme caution if you have a history of opiate abuse or dependence.  If you are on a pain contract with your primary care doctor or a pain specialist, be sure to let them know you were prescribed this medication today from the [facility].  This medication is intended for your use only - do not give any to anyone else and keep it in a secure place where nobody else, especially children, have access to it.  It will also cause or worsen constipation, so you may want to consider taking an over-the-counter stool softener while you are taking this medication.  Follow up as soon as possible with the orthopedic surgeon (Dr. Rudene Christians) listed in these papers, or with one of his partners.  Return immediately to the emergency department if he develop new or worsening symptoms that concern you.  Even though there is no indication that you have the condition at this time, we included information for you to read about  flexor tenosynovitis, which is an emergent condition that can occur if an infection and is allowed to get worse.  Please read through it and be sure to take all of your antibiotics as indicated.   Cellulitis Cellulitis is an infection of the skin and the tissue beneath it. The infected area is usually red and tender. Cellulitis occurs most often in the arms and lower legs.  CAUSES  Cellulitis is caused by bacteria that enter the skin through cracks or cuts in the skin. The most common types of bacteria that cause cellulitis are staphylococci and streptococci. SIGNS AND SYMPTOMS   Redness and warmth.  Swelling.  Tenderness or pain.  Fever. DIAGNOSIS  Your health care provider can usually determine what is wrong based on a physical exam. Blood tests may also be done. TREATMENT  Treatment usually involves taking an antibiotic medicine. HOME CARE INSTRUCTIONS   Take your antibiotic medicine as directed by your health care provider. Finish the antibiotic even if you start to feel better.  Keep the infected arm or leg elevated to reduce swelling.  Apply a warm cloth to the affected area up to 4 times per day to relieve pain.  Take medicines only as directed by your health care provider.  Keep all follow-up visits as directed by your health care provider. SEEK MEDICAL CARE IF:   You notice red streaks coming from the infected area.  Your red area gets larger or turns dark in color.  Your bone or  joint underneath the infected area becomes painful after the skin has healed.  Your infection returns in the same area or another area.  You notice a swollen bump in the infected area.  You develop new symptoms.  You have a fever. SEEK IMMEDIATE MEDICAL CARE IF:   You feel very sleepy.  You develop vomiting or diarrhea.  You have a general ill feeling (malaise) with muscle aches and pains.   This information is not intended to replace advice given to you by your health care  provider. Make sure you discuss any questions you have with your health care provider.   Document Released: 06/20/2005 Document Revised: 06/01/2015 Document Reviewed: 11/26/2011 Elsevier Interactive Patient Education 2016 [place] Infection When an infection is around the nail, it is called a paronychia. When it appears over the tip of the finger, it is called a felon. These infections are due to minor injuries or cracks in the skin. If they are not treated properly, they can lead to bone infection and permanent damage to the fingernail. Incision and drainage is necessary if a pus pocket (an abscess) has formed. Antibiotics and pain medicine may also be needed. Keep your hand elevated for the next 2-3 days to reduce swelling and pain. If a pack was placed in the abscess, it should be removed in 1-2 days by your caregiver. Soak the finger in warm water for 20 minutes 4 times daily to help promote drainage. Keep the hands as dry as possible. Wear protective gloves with cotton liners. See your caregiver for follow-up care as recommended.  HOME CARE INSTRUCTIONS   Keep wound clean, dry and dressed as suggested by your caregiver.  Soak in warm salt water for fifteen minutes, four times per day for bacterial infections.  Your caregiver will prescribe an antibiotic if a bacterial infection is suspected. Take antibiotics as directed and finish the prescription, even if the problem appears to be improving before the medicine is gone.  Only take over-the-counter or prescription medicines for pain, discomfort, or fever as directed by your caregiver. SEEK IMMEDIATE MEDICAL CARE IF:  There is redness, swelling, or increasing pain in the wound.  Pus or any other unusual drainage is coming from the wound.  An unexplained oral temperature above 102 F (38.9 C) develops.  You notice a foul smell coming from the wound or dressing. MAKE SURE YOU:   Understand these  instructions.  Monitor your condition.  Contact your caregiver if you are getting worse or not improving.   This information is not intended to replace advice given to you by your health care provider. Make sure you discuss any questions you have with your health care provider.   Document Released: 10/18/2004 Document Revised: 12/03/2011 Document Reviewed: 02/28/2015 Elsevier Interactive Patient Education [employer].

## 2015-10-02 NOTE — ED Provider Notes (Signed)
[facility] Emergency Department Provider Note  ____________________________________________  Time seen: Approximately 1:25 AM  I have reviewed the triage vital signs and the nursing notes.   HISTORY  Chief Complaint Hand Pain    HPI Kristi Gutierrez is a 35 y.o. female who presents for a follow-up visit for pain in her left index finger.  She was seen several days ago in flex and was prescribed Keflex 4 times a day for presumed minor cellulitis.  The patient states that she has been taking her antibiotics but upon further questioning she has been taking it at most twice a day and possibly just once a day.  She states that the pain has persisted and gotten worse.  She complains of pain with any amount of flexion or extension and that the swelling has gotten worse as well.  She complains of a variety of nonspecific issues such as pain radiating from her finger tip all the way to her left breast, nausea, subjective fever, etc., but the chief complaint is the pain in the left finger.  She describes the pain as gradual in onset, severe, much worse with any touch or movement, helped by nothing, and denies any history of trauma although she also endorses a small amount of "blue color" underneath her fingernail.  She states that she has had infections twice in the past in that finger.  She has not had any breaks in the skin recently.   Past Medical History  Diagnosis Date  . Pseudotumor cerebri   . Migraine     Patient Active Problem List   Diagnosis Date Noted  . Admission for sterilization 03/01/2015    Past Surgical History  Procedure Laterality Date  . Cyst excision    . Attempted btl-had to abort procedure  10-2014  . Mouth surgery    . Laparoscopic tubal ligation Bilateral 03/01/2015    Procedure: LAPAROSCOPIC TUBAL LIGATION;  Surgeon: Will Bonnet, MD;  Location: [facility] ORS;  Service: Gynecology;  Laterality: Bilateral;  . Tubal ligation      Current  Outpatient Rx  Name  Route  Sig  Dispense  Refill  . cephALEXin (KEFLEX) 500 MG capsule   Oral   Take 1 capsule (500 mg total) by mouth 4 (four) times daily.   40 capsule   0   . dicyclomine (BENTYL) 20 MG tablet   Oral   Take 1 tablet (20 mg total) by mouth every 6 (six) hours as needed for spasms (for abdominal cramping).   20 tablet   0   . docusate sodium (COLACE) 100 MG capsule      Take 1 tablet once or twice daily as needed for constipation while taking narcotic pain medicine   30 capsule   0   .           . meloxicam (MOBIC) 15 MG tablet   Oral   Take 1 tablet (15 mg total) by mouth daily.   30 tablet   0   . norgestimate-ethinyl estradiol (SPRINTEC 28) 0.25-35 MG-MCG tablet   Oral   Take 1 tablet by mouth daily. Patient not taking: Reported on 05/19/2015   1 Package   0   . ondansetron (ZOFRAN) 4 MG tablet      Take 1-2 tabs by mouth every 8 hours as needed for nausea/vomiting   30 tablet   0   . oxyCODONE-acetaminophen (ROXICET) 5-325 MG tablet   Oral   Take 1-2 tablets by mouth every 4 (  four) hours as needed for severe pain.   15 tablet   0   . promethazine (PHENERGAN) 25 MG tablet   Oral   Take 1 tablet (25 mg total) by mouth every 6 (six) hours as needed for nausea.   30 tablet   0     Allergies Review of patient's allergies indicates no known allergies.  No family history on file.  Social History Social History  Substance Use Topics  . Smoking status: Never Smoker   . Smokeless tobacco: Never Used  . Alcohol Use: No     Comment: occ    Review of Systems Constitutional: Subjective fever/chills Eyes: No visual changes. ENT: No sore throat. Cardiovascular: Denies chest pain. Respiratory: Denies shortness of breath. Gastrointestinal: No abdominal pain.  nausea, no vomiting.  No diarrhea.  No constipation. Genitourinary: Negative for dysuria. Musculoskeletal: Severe pain in her left index finger with some swelling. Skin: Negative  for rash. Neurological: Negative for headaches, focal weakness or numbness.  10-point ROS otherwise negative.  ____________________________________________   PHYSICAL EXAM:  VITAL SIGNS: ED Triage Vitals  Enc Vitals Group     BP 10/01/15 2326 133/62 mmHg     Pulse Rate 10/01/15 2326 79     Resp 10/01/15 2326 18     Temp 10/01/15 2326 97.5 F (36.4 C)     Temp Source 10/01/15 2326 Oral     SpO2 10/01/15 2326 100 %     Weight 10/01/15 2326 256 lb (116.121 kg)     Height 10/01/15 2326 5\' 6"  (1.676 m)     Head Cir --      Peak Flow --      Pain Score 10/01/15 2328 10     Pain Loc --      Pain Edu? --      Excl. in [place]? --    Constitutional: Alert and oriented. Well appearing and in no acute distress. Eyes: Conjunctivae are normal. PERRL. EOMI. Head: Atraumatic. Nose: No congestion/rhinnorhea. Mouth/Throat: Mucous membranes are moist.  Oropharynx non-erythematous. Neck: No stridor.   Cardiovascular: Normal rate, regular rhythm. Grossly normal heart sounds.  Good peripheral circulation. Respiratory: Normal respiratory effort.  No retractions. Lungs CTAB. Gastrointestinal: Soft and nontender. No distention. No abdominal bruits. No CVA tenderness. Musculoskeletal: Swelling to the distal phalanx of the left index finger on the palmar aspect.  No evidence of paronychia or felon.  There is some slight ecchymosis below the fingernail on the same digit but no subungual hematoma and no other sign of trauma.  The patient is holding the finger in slight flexion and she has severe tenderness to any palpation of the digit, but she extends it fully while she is talking and this seems to cause no pain.  She does not have fusiform swelling of the digit, no tenderness to palpation along the tendon sheath, and no erythema of the digit.  The swelling is confined to the distal phalanx. Neurologic:  Normal speech and language. No gross focal neurologic deficits are appreciated.  Skin:  Skin is warm, dry  and intact. No rash noted. Psychiatric: Mood, affect, and behavior are dramatic, though generally appropriate.  Alert and oriented.  ____________________________________________   LABS (all labs ordered are listed, but only abnormal results are displayed)  Labs Reviewed  CBC WITH DIFFERENTIAL/PLATELET - Abnormal; Notable for the following:    Platelets 148 (*)    Lymphs Abs 3.9 (*)    All other components within normal limits  BASIC METABOLIC PANEL -  Abnormal; Notable for the following:    CO2 18 (*)    Glucose, Bld 106 (*)    All other components within normal limits  SEDIMENTATION RATE - Abnormal; Notable for the following:    Sed Rate 26 (*)    All other components within normal limits   ____________________________________________  EKG  Not indicated ____________________________________________  RADIOLOGY I, Torrin Frein, personally viewed and evaluated these images (plain radiographs) as part of my medical decision making, as well as reviewing the written report by the radiologist.  Dg Finger Index Left  10/02/2015  CLINICAL DATA:  Index finger pain and swelling for 3 days. No known injury. EXAM: LEFT INDEX FINGER 2+V COMPARISON:  None. FINDINGS: No fracture. Joints are normally spaced and aligned. No arthropathic change. There is mild soft tissue swelling most evident proximally. No radiopaque foreign body. No soft tissue air. IMPRESSION: 1. Nonspecific soft tissue swelling. No fracture. No joint abnormality. Electronically Signed   By: Lajean Manes M.D.   On: 10/02/2015 00:34    ____________________________________________   PROCEDURES  Procedure(s) performed: None  Critical Care performed: No ____________________________________________   INITIAL IMPRESSION / ASSESSMENT AND PLAN / ED COURSE  Pertinent labs & imaging results that were available during my care of the patient were reviewed by me and considered in my medical decision making (see chart for  details).  My initial concern was for the possibility of early flexor tenosynovitis.  However, after extensive examination of the patient's finger, I conclude that the swelling is confined to the distal phalanx of the left index finger.  When she is distracted and telling me about her history and symptoms, I observed her fully extending the finger multiple times with no apparent pain or tenderness.  When I passively extend the finger, she says that it hurts, but she is able to do so voluntarily with no pain.  There is no erythema or break in the skin.  There is no obvious purulence.  There is no evidence of paronychia or felon.  She has no tenderness to palpation along the tendon sheath and no involvement more proximal than the distal phalanx even though she says that her entire finger hurts.  In short, she does not meet the Kanavel's signs of tenosynovitis.  Additionally, she has what appears to be some ecchymosis below her nail.  Even though she denies any direct trauma, I suspect she may have sustained a trauma that she does not specifically remember; she does not have a subungual hematoma, but the bruising suggested that perhaps she traumatize the distal tip of her finger and that is why she is having the swelling and pain/tenderness to the finger with no obvious signs of infection.  I spoke both with Dr. Rudene Christians, our local on-call orthopedic surgeon, and also with Dr. Alvin Critchley, the on-call orthopedic surgeon at [facility].  Based on my description of the physical exam and history, they both agree with my assessment that this is not consistent with flexor tenosynovitis.  They both agreed with my plan to broaden the antibiotics empirically; she has not been taking her Keflex 4 times a day as originally prescribed, so I counseled her that she needs to do so, and in addition I am prescribing doxycycline for MRSA coverage.  Dr. Rudene Christians said that his clinic would be able to follow up with her, so I recommended that she call  his clinic in 2 days to schedule a follow-up appointment.  Of note, the patient has a history of multiple  visits to multiple emergency departments for a variety of pain complaints.  I reviewed multiple prior emergency Department provider notes, and multiple physicians admission the number of CT scans that she has had.  I looked her up in the [place] controlled substance database and she has had multiple prescriptions filled for controlled substances over the last 6 months.  Because I can appreciate the fact that she does have some acute abnormalities in her left index finger, although it is still unclear whether this was traumatic or infectious, I have given her a prescription for a few more Percocet.  However, I discussed my concerns of her drug seeking behavior, or at least the appearance of such, with both her and her husband, and I counseled her that she should use nonnarcotic medication whenever possible.  She states that she understands and agrees. ____________________________________________  FINAL CLINICAL IMPRESSION(S) / ED DIAGNOSES  Final diagnoses:  Finger pain, left  Cellulitis of left index finger      NEW MEDICATIONS STARTED DURING THIS VISIT:  New Prescriptions   DOCUSATE SODIUM (COLACE) 100 MG CAPSULE    Take 1 tablet once or twice daily as needed for constipation while taking narcotic pain medicine   DOXYCYCLINE (VIBRAMYCIN) 100 MG CAPSULE    Take 1 capsule (100 mg) by mouth twice daily for 10 days.    Modified Medications   Modified Medication Previous Medication   OXYCODONE-ACETAMINOPHEN (ROXICET) 5-325 MG TABLET oxyCODONE-acetaminophen (ROXICET) 5-325 MG tablet      Take 1-2 tablets by mouth every 4 (four) hours as needed for severe pain.    Take 1-2 tablets by mouth every 4 (four) hours as needed for severe pain.      Hinda Kehr, MD 10/02/15 [phone]

## 2015-10-02 NOTE — ED Notes (Addendum)
Pt states she is in severe pain with her left index finger.  She denies any injury whatsoever but insists when she moves it she has shooting pains to her left breast that feels like a "burning sensation"  She is unable to make a fist or make her fin ger move in any direction.  Her finger does feel warm to me, and is larger than her right index finger - along with redness noted beneath her fingernail cuticle.

## 2015-12-01 ENCOUNTER — Other Ambulatory Visit: Payer: Self-pay | Admitting: Specialist

## 2015-12-20 ENCOUNTER — Encounter: Payer: Self-pay | Admitting: *Deleted

## 2015-12-20 ENCOUNTER — Other Ambulatory Visit: Payer: Medicaid Other

## 2015-12-20 NOTE — Patient Instructions (Signed)
  Your procedure is scheduled on: 12-26-15 Report to [place] To find out your arrival time please call [phone] between 1PM - 3PM on 12-23-15  Remember: Instructions that are not followed completely may result in serious medical risk, up to and including death, or upon the discretion of your surgeon and anesthesiologist your surgery may need to be rescheduled.    _X___ 1. Do not eat food or drink liquids after midnight. No gum chewing or hard candies.     _X___ 2. No Alcohol for 24 hours before or after surgery.   ____ 3. Bring all medications with you on the day of surgery if instructed.    ____ 4. Notify your doctor if there is any change in your medical condition     (cold, fever, infections).     Do not wear jewelry, make-up, hairpins, clips or nail polish.  Do not wear lotions, powders, or perfumes. You may wear deodorant.  Do not shave 48 hours prior to surgery. Men may shave face and neck.  Do not bring valuables to the hospital.    [facility] is not responsible for any belongings or valuables.               Contacts, dentures or bridgework may not be worn into surgery.  Leave your suitcase in the car. After surgery it may be brought to your room.  For patients admitted to the hospital, discharge time is determined by your treatment team.   Patients discharged the day of surgery will not be allowed to drive home.   Please read over the following fact sheets that you were given:    ____ Take these medicines the morning of surgery with A SIP OF WATER:    1. NONE  2.   3.   4.  5.  6.  ____ Fleet Enema (as directed)   ____ Use CHG Soap as directed  ____ Use inhalers on the day of surgery  ____ Stop metformin 2 days prior to surgery    ____ Take 1/2 of usual insulin dose the night before surgery and none on the morning of surgery.   ____ Stop Coumadin/Plavix/aspirin-N/A  ____ Stop Anti-inflammatories-NO NSAIDS OR ASA  PRODUCTS-TYLENOL OK    ____ Stop supplements until after surgery.    ____ Bring C-Pap to the hospital.

## 2015-12-25 DIAGNOSIS — Z6841 Body Mass Index (BMI) 40.0 and over, adult: Secondary | ICD-10-CM | POA: Diagnosis not present

## 2015-12-25 DIAGNOSIS — M67432 Ganglion, left wrist: Secondary | ICD-10-CM | POA: Diagnosis not present

## 2015-12-25 DIAGNOSIS — M25532 Pain in left wrist: Secondary | ICD-10-CM | POA: Diagnosis present

## 2015-12-26 ENCOUNTER — Encounter: Payer: Self-pay | Admitting: *Deleted

## 2015-12-26 ENCOUNTER — Ambulatory Visit: Payer: Medicaid Other | Admitting: Anesthesiology

## 2015-12-26 ENCOUNTER — Encounter
Admission: RE | Disposition: A | Discharge status: Home / Self Care | Payer: Self-pay | Source: Ambulatory Visit | Attending: Specialist

## 2015-12-26 ENCOUNTER — Ambulatory Visit
Admission: RE | Admit: 2015-12-26 | Discharge: 2015-12-26 | Disposition: A | Discharge status: Home / Self Care | Payer: Medicaid Other | Source: Ambulatory Visit | Attending: Specialist | Admitting: Specialist

## 2015-12-26 DIAGNOSIS — M67432 Ganglion, left wrist: Secondary | ICD-10-CM | POA: Diagnosis not present

## 2015-12-26 DIAGNOSIS — Z6841 Body Mass Index (BMI) 40.0 and over, adult: Secondary | ICD-10-CM | POA: Insufficient documentation

## 2015-12-26 HISTORY — PX: GANGLION CYST EXCISION: SHX1691

## 2015-12-26 LAB — POCT PREGNANCY, URINE: PREG TEST UR: NEGATIVE

## 2015-12-26 SURGERY — EXCISION, GANGLION CYST, WRIST
Anesthesia: General | Site: Wrist | Laterality: Left | Wound class: Clean

## 2015-12-26 MED ORDER — CLINDAMYCIN PHOSPHATE 600 MG/50ML IV SOLN
INTRAVENOUS | Status: AC
  Filled 2015-12-26: qty 50

## 2015-12-26 MED ORDER — HYDROCODONE-ACETAMINOPHEN 5-325 MG PO TABS: 1.0000 | ORAL_TABLET | Freq: Four times a day (QID) | ORAL | Status: DC | PRN

## 2015-12-26 MED ORDER — MELOXICAM 7.5 MG PO TABS
15.0000 mg | ORAL_TABLET | Freq: Every day | ORAL | Status: DC
  Administered 2015-12-26: 15 mg via ORAL

## 2015-12-26 MED ORDER — FENTANYL CITRATE (PF) 100 MCG/2ML IJ SOLN
INTRAMUSCULAR | Status: AC
  Administered 2015-12-26: 25 ug via INTRAVENOUS
  Filled 2015-12-26: qty 2

## 2015-12-26 MED ORDER — LIDOCAINE HCL (CARDIAC) 20 MG/ML IV SOLN
INTRAVENOUS | Status: DC | PRN
  Administered 2015-12-26: 100 mg via INTRAVENOUS

## 2015-12-26 MED ORDER — PHENYLEPHRINE HCL 10 MG/ML IJ SOLN
INTRAMUSCULAR | Status: DC | PRN
  Administered 2015-12-26 (×5): 100 ug via INTRAVENOUS

## 2015-12-26 MED ORDER — MELOXICAM 7.5 MG PO TABS
ORAL_TABLET | ORAL | Status: AC
  Filled 2015-12-26: qty 2

## 2015-12-26 MED ORDER — FENTANYL CITRATE (PF) 100 MCG/2ML IJ SOLN
25.0000 ug | INTRAMUSCULAR | Status: DC | PRN
  Administered 2015-12-26 (×5): 25 ug via INTRAVENOUS

## 2015-12-26 MED ORDER — MELOXICAM 15 MG PO TABS: 15.0000 mg | ORAL_TABLET | Freq: Every day | ORAL | Status: DC

## 2015-12-26 MED ORDER — FAMOTIDINE 20 MG PO TABS: 20.0000 mg | ORAL_TABLET | Freq: Once | ORAL | Status: DC

## 2015-12-26 MED ORDER — BUPIVACAINE HCL (PF) 0.5 % IJ SOLN
INTRAMUSCULAR | Status: AC
  Filled 2015-12-26: qty 30

## 2015-12-26 MED ORDER — GABAPENTIN 400 MG PO CAPS: 400.0000 mg | ORAL_CAPSULE | Freq: Three times a day (TID) | ORAL | Status: DC

## 2015-12-26 MED ORDER — HYDROCODONE-ACETAMINOPHEN 5-325 MG PO TABS
1.0000 | ORAL_TABLET | Freq: Four times a day (QID) | ORAL | Status: DC | PRN
  Administered 2015-12-26: 1 via ORAL

## 2015-12-26 MED ORDER — CEFAZOLIN SODIUM-DEXTROSE 2-4 GM/100ML-% IV SOLN
INTRAVENOUS | Status: AC
  Filled 2015-12-26: qty 100

## 2015-12-26 MED ORDER — GABAPENTIN 400 MG PO CAPS
ORAL_CAPSULE | ORAL | Status: AC
  Filled 2015-12-26: qty 1

## 2015-12-26 MED ORDER — FENTANYL CITRATE (PF) 100 MCG/2ML IJ SOLN
INTRAMUSCULAR | Status: AC
  Filled 2015-12-26: qty 2

## 2015-12-26 MED ORDER — ONDANSETRON HCL 4 MG/2ML IJ SOLN: 4.0000 mg | Freq: Once | INTRAMUSCULAR | Status: DC | PRN

## 2015-12-26 MED ORDER — ONDANSETRON HCL 4 MG/2ML IJ SOLN
INTRAMUSCULAR | Status: DC | PRN
  Administered 2015-12-26: 4 mg via INTRAVENOUS

## 2015-12-26 MED ORDER — BUPIVACAINE HCL 0.5 % IJ SOLN
INTRAMUSCULAR | Status: DC | PRN
  Administered 2015-12-26: 15 mL

## 2015-12-26 MED ORDER — GABAPENTIN 400 MG PO CAPS
400.0000 mg | ORAL_CAPSULE | Freq: Once | ORAL | Status: AC
  Administered 2015-12-26: 400 mg via ORAL

## 2015-12-26 MED ORDER — DEXAMETHASONE SODIUM PHOSPHATE 10 MG/ML IJ SOLN
INTRAMUSCULAR | Status: DC | PRN
  Administered 2015-12-26: 5 mg via INTRAVENOUS

## 2015-12-26 MED ORDER — LACTATED RINGERS IV SOLN
INTRAVENOUS | Status: DC
  Administered 2015-12-26: 12:00:00 via INTRAVENOUS

## 2015-12-26 MED ORDER — DEXTROSE 5 % IV SOLN
2.0000 g | INTRAVENOUS | Status: DC
  Filled 2015-12-26: qty 20

## 2015-12-26 MED ORDER — CEFAZOLIN SODIUM-DEXTROSE 2-4 GM/100ML-% IV SOLN
2.0000 g | INTRAVENOUS | Status: AC
  Administered 2015-12-26: 2 g via INTRAVENOUS

## 2015-12-26 MED ORDER — GLYCOPYRROLATE 0.2 MG/ML IJ SOLN
INTRAMUSCULAR | Status: DC | PRN
  Administered 2015-12-26: 0.2 mg via INTRAVENOUS

## 2015-12-26 MED ORDER — EPHEDRINE SULFATE 50 MG/ML IJ SOLN
INTRAMUSCULAR | Status: DC | PRN
  Administered 2015-12-26: 5 mg via INTRAVENOUS

## 2015-12-26 MED ORDER — PROPOFOL 10 MG/ML IV BOLUS
INTRAVENOUS | Status: DC | PRN
  Administered 2015-12-26: 200 mg via INTRAVENOUS

## 2015-12-26 MED ORDER — MIDAZOLAM HCL 2 MG/2ML IJ SOLN
INTRAMUSCULAR | Status: DC | PRN
  Administered 2015-12-26: 2 mg via INTRAVENOUS

## 2015-12-26 MED ORDER — FAMOTIDINE 20 MG PO TABS
ORAL_TABLET | ORAL | Status: AC
Start: 2015-12-26 — End: 2015-12-26
  Administered 2015-12-26: 20 mg
  Filled 2015-12-26: qty 1

## 2015-12-26 MED ORDER — HYDROCODONE-ACETAMINOPHEN 5-325 MG PO TABS
ORAL_TABLET | ORAL | Status: AC
  Filled 2015-12-26: qty 1

## 2015-12-26 MED ORDER — CLINDAMYCIN PHOSPHATE 600 MG/50ML IV SOLN
600.0000 mg | Freq: Three times a day (TID) | INTRAVENOUS | Status: DC
  Administered 2015-12-26: 600 mg via INTRAVENOUS

## 2015-12-26 MED ORDER — FENTANYL CITRATE (PF) 100 MCG/2ML IJ SOLN
INTRAMUSCULAR | Status: DC | PRN
  Administered 2015-12-26 (×2): 25 ug via INTRAVENOUS
  Administered 2015-12-26: 50 ug via INTRAVENOUS
  Administered 2015-12-26 (×2): 25 ug via INTRAVENOUS
  Administered 2015-12-26: 50 ug via INTRAVENOUS

## 2015-12-26 SURGICAL SUPPLY — 30 items
BLADE SURG MINI STRL (BLADE) ×2 IMPLANT
BNDG ESMARK 4X12 TAN STRL LF (GAUZE/BANDAGES/DRESSINGS) ×2 IMPLANT
CANISTER SUCT 1200ML W/VALVE (MISCELLANEOUS) ×2 IMPLANT
CHLORAPREP W/TINT 26ML (MISCELLANEOUS) ×2 IMPLANT
ELECT REM PT RETURN 9FT ADLT (ELECTROSURGICAL) ×2
ELECTRODE REM PT RTRN 9FT ADLT (ELECTROSURGICAL) ×1 IMPLANT
GAUZE FLUFF 18X24 1PLY STRL (GAUZE/BANDAGES/DRESSINGS) ×2 IMPLANT
GAUZE PETRO XEROFOAM 1X8 (MISCELLANEOUS) ×2 IMPLANT
GAUZE SPONGE 4X4 12PLY STRL (GAUZE/BANDAGES/DRESSINGS) ×2 IMPLANT
GLOVE SURG ORTHO 8.0 STRL STRW (GLOVE) ×2 IMPLANT
GOWN STRL REUS W/ TWL LRG LVL3 (GOWN DISPOSABLE) ×2 IMPLANT
GOWN STRL REUS W/TWL LRG LVL3 (GOWN DISPOSABLE) ×2
NS IRRIG 500ML POUR BTL (IV SOLUTION) ×2 IMPLANT
PACK EXTREMITY ARMC (MISCELLANEOUS) ×2 IMPLANT
PAD CAST CTTN 4X4 STRL (SOFTGOODS) ×1 IMPLANT
PADDING CAST COTTON 4X4 STRL (SOFTGOODS) ×1
SPLINT CAST 1 STEP 3X12 (MISCELLANEOUS) ×2 IMPLANT
STOCKINETTE BIAS CUT 4 980044 (GAUZE/BANDAGES/DRESSINGS) ×2 IMPLANT
STOCKINETTE STRL 4IN 9604848 (GAUZE/BANDAGES/DRESSINGS) ×2 IMPLANT
STRAP SAFETY BODY (MISCELLANEOUS) ×2 IMPLANT
STRIP CLOSURE SKIN 1/2X4 (GAUZE/BANDAGES/DRESSINGS) ×2 IMPLANT
SUT ETHILON 4-0 (SUTURE) ×1
SUT ETHILON 4-0 FS2 18XMFL BLK (SUTURE) ×1
SUT ETHILON 5-0 (SUTURE) ×1
SUT ETHILON 5-0 C-3 18XMFL BLK (SUTURE) ×1
SUT PROLENE 3 0 FS 2 (SUTURE) ×2 IMPLANT
SUT VIC AB 4-0 FS2 27 (SUTURE) ×2 IMPLANT
SUTURE ETHLN 4-0 FS2 18XMF BLK (SUTURE) ×1 IMPLANT
SUTURE ETHLN 5-0 C3 18XMF BLK (SUTURE) ×1 IMPLANT
SWABSTK COMLB BENZOIN TINCTURE (MISCELLANEOUS) ×2 IMPLANT

## 2015-12-26 NOTE — Discharge Instructions (Signed)
General Anesthesia, Adult, Care After Refer to this sheet in the next few weeks. These instructions provide you with information on caring for yourself after your procedure. Your health care provider may also give you more specific instructions. Your treatment has been planned according to current medical practices, but problems sometimes occur. Call your health care provider if you have any problems or questions after your procedure. WHAT TO EXPECT AFTER THE PROCEDURE After the procedure, it is typical to experience:  Sleepiness.  Nausea and vomiting. HOME CARE INSTRUCTIONS  For the first 24 hours after general anesthesia:  Have a responsible person with you.  Do not drive a car. If you are alone, do not take public transportation.  Do not drink alcohol.  Do not take medicine that has not been prescribed by your health care provider.  Do not sign important papers or make important decisions.  You may resume a normal diet and activities as directed by your health care provider.  Change bandages (dressings) as directed.  If you have questions or problems that seem related to general anesthesia, call the hospital and ask for the anesthetist or anesthesiologist on call. SEEK MEDICAL CARE IF:  You have nausea and vomiting that continue the day after anesthesia.  You develop a rash. SEEK IMMEDIATE MEDICAL CARE IF:   You have difficulty breathing.  You have chest pain.  You have any allergic problems.   This information is not intended to replace advice given to you by your health care provider. Make sure you discuss any questions you have with your health care provider.   Document Released: 12/17/2000 Document Revised: 10/01/2014 Document Reviewed: 01/09/2012 Elsevier Interactive Patient Education 2016 Elsevier Inc[employer]   Ganglion Cyst A ganglion cyst is a noncancerous, fluid-filled lump that occurs near joints or tendons. The ganglion cyst grows out of a joint or the lining of  a tendon. It most often develops in the hand or wrist, but it can also develop in the shoulder, elbow, hip, knee, ankle, or foot. The round or oval ganglion cyst can be the size of a pea or larger than a grape. Increased activity may enlarge the size of the cyst because more fluid starts to build up.  CAUSES It is not known what causes a ganglion cyst to grow. However, it may be related to:  Inflammation or irritation around the joint.  An injury.  Repetitive movements or overuse.  Arthritis. RISK FACTORS Risk factors include:  Being a woman.  Being age 108-50. SIGNS AND SYMPTOMS Symptoms may include:   A lump. This most often appears on the hand or wrist, but it can occur in other areas of the body.  Tingling.  Pain.  Numbness.  Muscle weakness.  Weak grip.  Less movement in a joint. DIAGNOSIS Ganglion cysts are most often diagnosed based on a physical exam. Your health care provider will feel the lump and may shine a light alongside it. If it is a ganglion cyst, a light often shines through it. Your health care provider may order an X-ray, ultrasound, or MRI to rule out other conditions. TREATMENT Ganglion cysts usually go away on their own without treatment. If pain or other symptoms are involved, treatment may be needed. Treatment is also needed if the ganglion cyst limits your movement or if it gets infected. Treatment may include:  Wearing a brace or splint on your wrist or finger.  Taking anti-inflammatory medicine.  Draining fluid from the lump with a needle (aspiration).  Injecting a  steroid into the joint.  Surgery to remove the ganglion cyst. HOME CARE INSTRUCTIONS  Do not press on the ganglion cyst, poke it with a needle, or hit it.  Take medicines only as directed by your health care provider.  Wear your brace or splint as directed by your health care provider.  Watch your ganglion cyst for any changes.  Keep all follow-up visits as directed by  your health care provider. This is important. SEEK MEDICAL CARE IF:  Your ganglion cyst becomes larger or more painful.  You have increased redness, red streaks, or swelling.  You have pus coming from the lump.  You have weakness or numbness in the affected area.  You have a fever or chills.   This information is not intended to replace advice given to you by your health care provider. Make sure you discuss any questions you have with your health care provider.   Document Released: 09/07/2000 Document Revised: 10/01/2014 Document Reviewed: 02/23/2014 Elsevier Interactive Patient Education [employer].

## 2015-12-26 NOTE — Anesthesia Procedure Notes (Signed)
Procedure Name: LMA Insertion Performed by: Adilson Grafton Pre-anesthesia Checklist: Patient identified, Patient being monitored, Timeout performed, Emergency Drugs available and Suction available Patient Re-evaluated:Patient Re-evaluated prior to inductionOxygen Delivery Method: Circle system utilized Preoxygenation: Pre-oxygenation with 100% oxygen Intubation Type: IV induction Ventilation: Mask ventilation without difficulty LMA: LMA inserted LMA Size: 4.0 Tube type: Oral Number of attempts: 1 Placement Confirmation: positive ETCO2 and breath sounds checked- equal and bilateral Tube secured with: Tape Dental Injury: Teeth and Oropharynx as per pre-operative assessment        

## 2015-12-26 NOTE — Op Note (Signed)
12/26/2015  1:47 PM  PATIENT:  Kristi Gutierrez    PRE-OPERATIVE DIAGNOSIS:  Recurrent ganglion dorsum left wristLEFT   POST-OPERATIVE DIAGNOSIS:  Same  PROCEDURE:  REMOVAL GANGLION OF LEFTWRIST  SURGEON:  Park Breed, MD  ANESTHESIA:   General  TOURNIQUET TIME:30 minutesATIVE INDICATIONS:  [name] is a  35 y.o. female with a diagnosis of Pain in left wrist who failed conservative measures and elected for surgical management. Prior ganglion has recurred and is painful and restricts motion.  Requests excision.  The risks benefits and alternatives were discussed with the patient preoperatively including but not limited to the risks of infection, bleeding, nerve injury, cardiopulmonary complications, the need for revision surgery, among others, and the patient was willing to proceed.   OPERATIVE FINDINGS: Recurrent ganglion from dorsum of left wrist joint with extensive synovitis and scarring.  OPERATIVE PROCEDURE: The patient was brought to the operating room where they underwent satisfactory general LMA anesthesia.  The  arm was prepped and draped in sterile fashion.  Esmarch was applied and tourniquet inflated to 250 mmHg. .  A short transverse incision was made over the dorsal aspect of the  wrist.  Dissection was carried out carefully under loupe magnification with fine scissors. Enlarged soft tissue was encountered with extensive synovitis and scarring. This was dissected proximally down to the carpus and the mass excised sharply. The radiolunate capitate region was exposed. Francee Piccolo was used to remove scar tissue.  Wound was irrigated and subcutaneous change tissue closed with 4-0 Vicryl and the skin wi3-0 Prolene and Steri-Strips.  Sponge and needle counts were correct.  1/2%  Marcaine was placed in the wound.  A dry sterile compression hand dressing with volar splint was applied.  Tourniquet was deflated with good return of blood flow to the hand.  The patient was awakened and taken  to recovery in good condition.    Park Breed, M.D.

## 2015-12-26 NOTE — Anesthesia Preprocedure Evaluation (Addendum)
Anesthesia Evaluation  Patient identified by MRN, date of birth, ID band Patient awake    Reviewed: Allergy & Precautions, NPO status , Patient's Chart, lab work & pertinent test results  History of Anesthesia Complications Negative for: history of anesthetic complications  Airway Mallampati: III       Dental   Pulmonary neg pulmonary ROS,           Cardiovascular negative cardio ROS       Neuro/Psych negative neurological ROS     GI/Hepatic negative GI ROS, Neg liver ROS,   Endo/Other  negative endocrine ROSMorbid obesity  Renal/GU negative Renal ROS     Musculoskeletal   Abdominal   Peds  Hematology   Anesthesia Other Findings   Reproductive/Obstetrics                             Anesthesia Physical Anesthesia Plan  ASA: II  Anesthesia Plan: General   Post-op Pain Management:    Induction: Intravenous  Airway Management Planned: LMA  Additional Equipment:   Intra-op Plan:   Post-operative Plan:   Informed Consent: I have reviewed the patients History and Physical, chart, labs and discussed the procedure including the risks, benefits and alternatives for the proposed anesthesia with the patient or authorized representative who has indicated his/her understanding and acceptance.     Plan Discussed with:   Anesthesia Plan Comments:         Anesthesia Quick Evaluation

## 2015-12-26 NOTE — Transfer of Care (Signed)
Immediate Anesthesia Transfer of Care Note  Patient: Kristi Gutierrez  Procedure(s) Performed: Procedure(s): REMOVAL GANGLION OF WRIST (Left)  Patient Location: PACU  Anesthesia Type:General  Level of Consciousness: awake, alert  and oriented  Airway & Oxygen Therapy: Patient Spontanous Breathing and Patient connected to face mask oxygen  Post-op Assessment: Report given to RN and Post -op Vital signs reviewed and stable  Post vital signs: Reviewed and stable  Last Vitals:  Filed Vitals:   12/26/15 1113  BP: 133/77  Pulse: 81  Temp: 36.6 C  Resp: 16    Complications: No apparent anesthesia complications

## 2015-12-26 NOTE — H&P (Signed)
THE PATIENT WAS SEEN PRIOR TO SURGERY TODAY.  HISTORY, ALLERGIES, HOME MEDICATIONS AND OPERATIVE PROCEDURE WERE REVIEWED. RISKS AND BENEFITS OF SURGERY DISCUSSED WITH PATIENT AGAIN.  NO CHANGES FROM INITIAL HISTORY AND PHYSICAL NOTED.    

## 2015-12-27 ENCOUNTER — Encounter: Payer: Self-pay | Admitting: Specialist

## 2015-12-27 LAB — SURGICAL PATHOLOGY

## 2015-12-27 NOTE — Anesthesia Postprocedure Evaluation (Signed)
Anesthesia Post Note  Patient: Kristi Gutierrez  Procedure(s) Performed: Procedure(s) (LRB): REMOVAL GANGLION OF WRIST (Left)  Patient location during evaluation: PACU Anesthesia Type: General Level of consciousness: awake and alert Pain management: pain level controlled Vital Signs Assessment: post-procedure vital signs reviewed and stable Respiratory status: spontaneous breathing and respiratory function stable Cardiovascular status: blood pressure returned to baseline and stable Anesthetic complications: no    Last Vitals:  Filed Vitals:   12/26/15 1456 12/26/15 1510  BP: 100/51 101/54  Pulse: 82 83  Temp: 36.3 C   Resp: 18 18    Last Pain:  Filed Vitals:   12/26/15 1517  PainSc: 7                  Jastin Fore K

## 2016-01-10 ENCOUNTER — Emergency Department
Admission: EM | Admit: 2016-01-10 | Discharge: 2016-01-10 | Disposition: A | Discharge status: Home / Self Care | Payer: Medicaid Other | Attending: Emergency Medicine | Admitting: Emergency Medicine

## 2016-01-10 ENCOUNTER — Emergency Department: Payer: Medicaid Other

## 2016-01-10 ENCOUNTER — Encounter: Payer: Self-pay | Admitting: Emergency Medicine

## 2016-01-10 DIAGNOSIS — R350 Frequency of micturition: Secondary | ICD-10-CM | POA: Diagnosis present

## 2016-01-10 DIAGNOSIS — N390 Urinary tract infection, site not specified: Secondary | ICD-10-CM

## 2016-01-10 LAB — URINALYSIS COMPLETE WITH MICROSCOPIC (ARMC ONLY)
Bilirubin Urine: NEGATIVE
Glucose, UA: NEGATIVE mg/dL
Ketones, ur: NEGATIVE mg/dL
Nitrite: NEGATIVE
PROTEIN: 30 mg/dL — AB
Specific Gravity, Urine: 1.02 (ref 1.005–1.030)
pH: 6 (ref 5.0–8.0)

## 2016-01-10 LAB — POCT PREGNANCY, URINE: PREG TEST UR: NEGATIVE

## 2016-01-10 MED ORDER — PHENAZOPYRIDINE HCL 200 MG PO TABS: 200.0000 mg | ORAL_TABLET | Freq: Three times a day (TID) | ORAL | Status: DC | PRN

## 2016-01-10 MED ORDER — CIPROFLOXACIN HCL 500 MG PO TABS: 500.0000 mg | ORAL_TABLET | Freq: Two times a day (BID) | ORAL | Status: DC

## 2016-01-10 MED ORDER — HYDROMORPHONE HCL 1 MG/ML IJ SOLN
1.0000 mg | Freq: Once | INTRAMUSCULAR | Status: AC
  Administered 2016-01-10: 1 mg via INTRAMUSCULAR
  Filled 2016-01-10: qty 1

## 2016-01-10 MED ORDER — OXYCODONE-ACETAMINOPHEN 7.5-325 MG PO TABS: 1.0000 | ORAL_TABLET | Freq: Four times a day (QID) | ORAL | Status: DC | PRN

## 2016-01-10 MED ORDER — KETOROLAC TROMETHAMINE 60 MG/2ML IM SOLN
30.0000 mg | Freq: Once | INTRAMUSCULAR | Status: AC
  Administered 2016-01-10: 30 mg via INTRAMUSCULAR
  Filled 2016-01-10: qty 2

## 2016-01-10 NOTE — ED Notes (Signed)
Pt to ed with c/o urinary burning, pain, and frequency x 2 days.

## 2016-01-10 NOTE — ED Provider Notes (Signed)
[facility] Emergency Department Provider Note  ____________________________________________  Time seen: Approximately 3:38 PM  I have reviewed the triage vital signs and the nursing notes.   HISTORY  Chief Complaint Urinary Frequency    HPI Kristi Gutierrez is a 35 y.o. female patient complaining of bilateral flank pain, dysuria and frequency for 2 days.  Patient also states infrequent menstrual cycle for approximately one year. Patient denies any fevers chills associated this complaint. Patient states she's had bilateral flank pain. Patient denies any vaginal discharge. Patient rates the pain as a 9/10. Patient described a pain as "sharp". No palliative measures taken for this complaint. Patient has a long history of UTIs but state this episode was more painful than normal. Patient is status post tubal ligation 10 months ago.  Past Medical History  Diagnosis Date  . Pseudotumor cerebri   . Migraine     Patient Active Problem List   Diagnosis Date Noted  . Admission for sterilization 03/01/2015    Past Surgical History  Procedure Laterality Date  . Cyst excision    . Attempted btl-had to abort procedure  10-2014  . Mouth surgery    . Laparoscopic tubal ligation Bilateral 03/01/2015    Procedure: LAPAROSCOPIC TUBAL LIGATION;  Surgeon: Will Bonnet, MD;  Location: [facility] ORS;  Service: Gynecology;  Laterality: Bilateral;  . Tubal ligation    . Ganglion cyst excision Left 12/26/2015    Procedure: REMOVAL GANGLION OF WRIST;  Surgeon: Earnestine Leys, MD;  Location: [facility] ORS;  Service: Orthopedics;  Laterality: Left;    Current Outpatient Rx  Name  Route  Sig  Dispense  Refill  . ciprofloxacin (CIPRO) 500 MG tablet   Oral   Take 1 tablet (500 mg total) by mouth 2 (two) times daily.   20 tablet   0   . gabapentin (NEURONTIN) 400 MG capsule   Oral   Take 1 capsule (400 mg total) by mouth 3 (three) times daily.   60 capsule   3   .  HYDROcodone-acetaminophen (NORCO) 5-325 MG tablet   Oral   Take 1-2 tablets by mouth every 6 (six) hours as needed.   50 tablet   0   . meloxicam (MOBIC) 15 MG tablet   Oral   Take 1 tablet (15 mg total) by mouth daily.   30 tablet   3   . oxyCODONE-acetaminophen (PERCOCET) 7.5-325 MG tablet   Oral   Take 1 tablet by mouth every 6 (six) hours as needed for severe pain.   12 tablet   0   . phenazopyridine (PYRIDIUM) 200 MG tablet   Oral   Take 1 tablet (200 mg total) by mouth 3 (three) times daily as needed for pain.   6 tablet   0     Allergies Review of patient's allergies indicates no known allergies.  No family history on file.  Social History Social History  Substance Use Topics  . Smoking status: Never Smoker   . Smokeless tobacco: Never Used  . Alcohol Use: No     Comment: occ    Review of Systems Constitutional: No fever/chills Eyes: No visual changes. ENT: No sore throat. Cardiovascular: Denies chest pain. Respiratory: Denies shortness of breath. Gastrointestinal: No abdominal pain.  No nausea, no vomiting.  No diarrhea.  No constipation. Genitourinary: Positive for dysuria. Musculoskeletal: Negative for back pain. Skin: Negative for rash. Neurological: Negative for headaches, focal weakness or numbness.    ____________________________________________   PHYSICAL EXAM:  VITAL  SIGNS: ED Triage Vitals  Enc Vitals Group     BP 01/10/16 1507 118/95 mmHg     Pulse Rate 01/10/16 1507 85     Resp 01/10/16 1507 20     Temp 01/10/16 1507 97.5 F (36.4 C)     Temp Source 01/10/16 1507 Oral     SpO2 01/10/16 1507 99 %     Weight 01/10/16 1507 257 lb (116.574 kg)     Height 01/10/16 1507 5\' 6"  (1.676 m)     Head Cir --      Peak Flow --      Pain Score 01/10/16 1507 9     Pain Loc --      Pain Edu? --      Excl. in [place]? --     Constitutional: Alert and oriented. Well appearing and in no acute distress. Eyes: Conjunctivae are normal. PERRL.  EOMI. Head: Atraumatic. Nose: No congestion/rhinnorhea. Mouth/Throat: Mucous membranes are moist.  Oropharynx non-erythematous. Neck: No stridor.  No cervical spine tenderness to palpation. Hematological/Lymphatic/Immunilogical: No cervical lymphadenopathy. Cardiovascular: Normal rate, regular rhythm. Grossly normal heart sounds.  Good peripheral circulation. Respiratory: Normal respiratory effort.  No retractions. Lungs CTAB. Gastrointestinal: Soft and nontender. No distention. No abdominal bruits. Bilateral CVA tenderness. Genitourinary:  Deferred Musculoskeletal: No lower extremity tenderness nor edema.  No joint effusions. Neurologic:  Normal speech and language. No gross focal neurologic deficits are appreciated. No gait instability. Skin:  Skin is warm, dry and intact. No rash noted. Psychiatric: Mood and affect are normal. Speech and behavior are normal.  ____________________________________________   LABS (all labs ordered are listed, but only abnormal results are displayed)  Labs Reviewed  URINALYSIS COMPLETEWITH MICROSCOPIC ([place] ONLY) - Abnormal; Notable for the following:    Color, Urine YELLOW (*)    APPearance CLOUDY (*)    Hgb urine dipstick 2+ (*)    Protein, ur 30 (*)    Leukocytes, UA 2+ (*)    Bacteria, UA FEW (*)    Squamous Epithelial / LPF 0-5 (*)    All other components within normal limits  POC URINE PREG, ED  POCT PREGNANCY, URINE   ____________________________________________  EKG   ____________________________________________  RADIOLOGY  Ultra sounds abdomen and pelvis unremarkable. ____________________________________________   PROCEDURES  Procedure(s) performed: None  Critical Care performed: No  ____________________________________________   INITIAL IMPRESSION / ASSESSMENT AND PLAN / ED COURSE  Pertinent labs & imaging results that were available during my care of the patient were reviewed by me and considered in my medical  decision making (see chart for details).  UTI. Discuss [place] results with patient. Patient given a prescription for Cipro, Pyridium and Percocets. ____________________________________________   FINAL CLINICAL IMPRESSION(S) / ED DIAGNOSES  Final diagnoses:  UTI (lower urinary tract infection)    For over a year.  Kristi Feil, PA-C 01/10/16 1734  Harvest Dark, MD 01/10/16 [phone]

## 2016-02-28 ENCOUNTER — Emergency Department
Admission: EM | Admit: 2016-02-28 | Discharge: 2016-02-28 | Disposition: A | Discharge status: Home / Self Care | Payer: Medicaid Other | Attending: Emergency Medicine | Admitting: Emergency Medicine

## 2016-02-28 ENCOUNTER — Encounter: Payer: Self-pay | Admitting: Emergency Medicine

## 2016-02-28 ENCOUNTER — Emergency Department: Payer: Medicaid Other

## 2016-02-28 DIAGNOSIS — G43909 Migraine, unspecified, not intractable, without status migrainosus: Secondary | ICD-10-CM | POA: Diagnosis present

## 2016-02-28 DIAGNOSIS — M79602 Pain in left arm: Secondary | ICD-10-CM | POA: Diagnosis not present

## 2016-02-28 MED ORDER — SODIUM CHLORIDE 0.9 % IV SOLN
1000.0000 mL | Freq: Once | INTRAVENOUS | Status: AC
  Administered 2016-02-28: 1000 mL via INTRAVENOUS

## 2016-02-28 MED ORDER — METOCLOPRAMIDE HCL 5 MG/ML IJ SOLN
20.0000 mg | Freq: Once | INTRAVENOUS | Status: AC
  Administered 2016-02-28: 20 mg via INTRAVENOUS
  Filled 2016-02-28: qty 4

## 2016-02-28 MED ORDER — BUTALBITAL-APAP-CAFFEINE 50-325-40 MG PO TABS: 1.0000 | ORAL_TABLET | Freq: Four times a day (QID) | ORAL | Status: DC | PRN

## 2016-02-28 MED ORDER — KETOROLAC TROMETHAMINE 30 MG/ML IJ SOLN
30.0000 mg | Freq: Once | INTRAMUSCULAR | Status: AC
  Administered 2016-02-28: 30 mg via INTRAVENOUS
  Filled 2016-02-28: qty 1

## 2016-02-28 MED ORDER — DIPHENHYDRAMINE HCL 50 MG/ML IJ SOLN
25.0000 mg | Freq: Once | INTRAMUSCULAR | Status: AC
  Administered 2016-02-28: 25 mg via INTRAVENOUS
  Filled 2016-02-28: qty 1

## 2016-02-28 NOTE — ED Notes (Signed)
Upon initial assessment, pt. In NAD, VS stable. Pt. Reports headache the past 3 days tx at home with tylenol and Excedrin. Pt. Last dose at 0730 today. Pt. Reports nausea and decreased appetite. Pt. Reports vomiting 4x this am.

## 2016-02-28 NOTE — Discharge Instructions (Signed)

## 2016-02-28 NOTE — ED Notes (Signed)
Patient presents to the ED with severe migraine x 3 days.  Patient reports history of migraines but this feels worse.  Patient reports nausea and vomiting.  Patient states vomiting x 5 in the past 24 hours.  Patient also reports severe left arm pain x 2 weeks.  Patient has several small bumps in left hand that patient attributes pain to.  Patient states excedrin migraine usually works for previous migraines but has not improved this migraine.

## 2016-02-28 NOTE — ED Provider Notes (Signed)
[facility] Emergency Department Provider Note  ____________________________________________    I have reviewed the triage vital signs and the nursing notes.   HISTORY  Chief Complaint Migraine and Arm Pain    HPI Kristi Gutierrez is a 35 y.o. female who presents with complaints of a migraine. Patient tells me she has a history of migraine headaches reports the swelling has not improved with her typical treatments. She has had it for 2-3 days. She complains of nausea as well. She denies head injury. She is not on blood thinners. No neuro deficits. She reports global throbbing pain similar to prior migraines. Gradual onset. Patient's past medical history notes history of pseudotumor cerebri as well as migraines.     Past Medical History  Diagnosis Date  . Pseudotumor cerebri   . Migraine     Patient Active Problem List   Diagnosis Date Noted  . Admission for sterilization 03/01/2015    Past Surgical History  Procedure Laterality Date  . Cyst excision    . Attempted btl-had to abort procedure  10-2014  . Mouth surgery    . Laparoscopic tubal ligation Bilateral 03/01/2015    Procedure: LAPAROSCOPIC TUBAL LIGATION;  Surgeon: Will Bonnet, MD;  Location: [facility] ORS;  Service: Gynecology;  Laterality: Bilateral;  . Tubal ligation    . Ganglion cyst excision Left 12/26/2015    Procedure: REMOVAL GANGLION OF WRIST;  Surgeon: Earnestine Leys, MD;  Location: [facility] ORS;  Service: Orthopedics;  Laterality: Left;    Current Outpatient Rx  Name  Route  Sig  Dispense  Refill  . ciprofloxacin (CIPRO) 500 MG tablet   Oral   Take 1 tablet (500 mg total) by mouth 2 (two) times daily.   20 tablet   0   . gabapentin (NEURONTIN) 400 MG capsule   Oral   Take 1 capsule (400 mg total) by mouth 3 (three) times daily.   60 capsule   3   . HYDROcodone-acetaminophen (NORCO) 5-325 MG tablet   Oral   Take 1-2 tablets by mouth every 6 (six) hours as needed.   50 tablet  0   . meloxicam (MOBIC) 15 MG tablet   Oral   Take 1 tablet (15 mg total) by mouth daily.   30 tablet   3   . oxyCODONE-acetaminophen (PERCOCET) 7.5-325 MG tablet   Oral   Take 1 tablet by mouth every 6 (six) hours as needed for severe pain.   12 tablet   0   . phenazopyridine (PYRIDIUM) 200 MG tablet   Oral   Take 1 tablet (200 mg total) by mouth 3 (three) times daily as needed for pain.   6 tablet   0     Allergies Review of patient's allergies indicates no known allergies.  No family history on file.  Social History Social History  Substance Use Topics  . Smoking status: Never Smoker   . Smokeless tobacco: Never Used  . Alcohol Use: No     Comment: occ    Review of Systems  Constitutional: Negative for fever. Eyes: Negative for redness ENT: Negative for sore throat Cardiovascular: Negative for chest pain Respiratory: Negative for shortness of breath. Gastrointestinal: Negative for abdominal pain Genitourinary: Negative for dysuria. Musculoskeletal: Negative for back pain. Skin: Negative for rash. Neurological: Negative for focal weakness Psychiatric: no anxiety    ____________________________________________   PHYSICAL EXAM:  VITAL SIGNS: ED Triage Vitals  Enc Vitals Group     BP 02/28/16 1023 117/64 mmHg  Pulse Rate 02/28/16 1023 89     Resp 02/28/16 1023 20     Temp 02/28/16 1023 98 F (36.7 C)     Temp Source 02/28/16 1023 Oral     SpO2 02/28/16 1023 100 %     Weight 02/28/16 1023 257 lb (116.574 kg)     Height 02/28/16 1023 5' 6.5" (1.689 m)     Head Cir --      Peak Flow --      Pain Score 02/28/16 1024 10     Pain Loc --      Pain Edu? --      Excl. in [place]? --      Constitutional: Alert and oriented. Well appearing and in no distress.  Eyes: Conjunctivae are normal. No erythema or injection, PERRLA, EOMI ENT   Head: Normocephalic and atraumatic.   Mouth/Throat: Mucous membranes are moist. Cardiovascular: Normal rate,  regular rhythm. Normal and symmetric distal pulses are present in the upper extremities.  Respiratory: Normal respiratory effort without tachypnea nor retractions.  Gastrointestinal: Soft and non-tender in all quadrants.  Genitourinary: deferred Musculoskeletal: Nontender with normal range of motion in all extremities.  Neurologic:  Normal speech and language. No gross focal neurologic deficits are appreciated. Cranial nerves II-12 are normal Skin:  Skin is warm, dry and intact. No rash noted. Psychiatric: Mood and affect are normal. Patient exhibits appropriate insight and judgment.  ____________________________________________    LABS (pertinent positives/negatives)  Labs Reviewed - No data to display  ____________________________________________   EKG  None  ____________________________________________    RADIOLOGY  CT head unremarkable  ____________________________________________   PROCEDURES  Procedure(s) performed: none  Critical Care performed: none  ____________________________________________   INITIAL IMPRESSION / ASSESSMENT AND PLAN / ED COURSE  Pertinent labs & imaging results that were available during my care of the patient were reviewed by me and considered in my medical decision making (see chart for details).  Patient with history of migraines and apparently pseudotumor cerebri. We will attempt conservative treatment with Reglan and Benadryl, Toradol if normal CT head. We'll discuss LP if no improvement.   ----------------------------------------- 4:42 PM on 02/28/2016 -----------------------------------------  Patient reports her headache is completely resolved. She denies any visual changes. She is relatively new to the area and needs a neurologist follow-up with. I did discuss with her the risks of blindness and permanent damage from pseudotumor cerebri and offered admission but she states because she is feeling better she is happy to go home  and understands the need to return if any changes.  ____________________________________________   FINAL CLINICAL IMPRESSION(S) / ED DIAGNOSES  Final diagnoses:  Migraine without status migrainosus, not intractable, unspecified migraine type          Lavonia Drafts, MD 02/28/16 1643

## 2016-03-02 ENCOUNTER — Other Ambulatory Visit: Payer: Self-pay | Admitting: Orthopedic Surgery

## 2016-03-02 DIAGNOSIS — M67432 Ganglion, left wrist: Secondary | ICD-10-CM

## 2016-03-02 DIAGNOSIS — M25532 Pain in left wrist: Secondary | ICD-10-CM

## 2016-03-02 DIAGNOSIS — G8929 Other chronic pain: Secondary | ICD-10-CM

## 2016-03-21 DIAGNOSIS — E790 Hyperuricemia without signs of inflammatory arthritis and tophaceous disease: Secondary | ICD-10-CM | POA: Insufficient documentation

## 2016-03-21 DIAGNOSIS — R7982 Elevated C-reactive protein (CRP): Secondary | ICD-10-CM | POA: Insufficient documentation

## 2016-04-13 ENCOUNTER — Encounter: Payer: Self-pay | Admitting: *Deleted

## 2016-04-13 ENCOUNTER — Other Ambulatory Visit: Payer: Medicaid Other

## 2016-04-13 NOTE — Patient Instructions (Signed)
  Your procedure is scheduled on: 04-20-16 (FRIDAY) Report to Same Day Surgery 2nd floor medical mall To find out your arrival time please call [phone] between 1PM - 3PM on 04-20-16 [facility])  Remember: Instructions that are not followed completely may result in serious medical risk, up to and including death, or upon the discretion of your surgeon and anesthesiologist your surgery may need to be rescheduled.    _x___ 1. Do not eat food or drink liquids after midnight. No gum chewing or hard candies.     __x__ 2. No Alcohol for 24 hours before or after surgery.   __x__3. No Smoking for 24 prior to surgery.   ____  4. Bring all medications with you on the day of surgery if instructed.    __x__ 5. Notify your doctor if there is any change in your medical condition     (cold, fever, infections).     Do not wear jewelry, make-up, hairpins, clips or nail polish.  Do not wear lotions, powders, or perfumes. You may wear deodorant.  Do not shave 48 hours prior to surgery. Men may shave face and neck.  Do not bring valuables to the hospital.    [facility] is not responsible for any belongings or valuables.               Contacts, dentures or bridgework may not be worn into surgery.  Leave your suitcase in the car. After surgery it may be brought to your room.  For patients admitted to the hospital, discharge time is determined by your treatment team.   Patients discharged the day of surgery will not be allowed to drive home.    Please read over the following fact sheets that you were given:   [facility] Preparing for Surgery and or MRSA Information   ____ Take these medicines the morning of surgery with A SIP OF WATER:    1. NONE  2.  3.  4.  5.  6.  ____ Fleet Enema (as directed)   ____ Use CHG Soap or sage wipes as directed on instruction sheet   ____ Use inhalers on the day of surgery and bring to hospital day of surgery  ____ Stop metformin 2 days prior to  surgery    ____ Take 1/2 of usual insulin dose the night before surgery and none on the morning of  surgery.   ____ Stop aspirin or coumadin, or plavix  _x__ Stop Anti-inflammatories such as Advil, Aleve, Ibuprofen, Motrin, Naproxen,          Naprosyn, Goodies powders or aspirin products. Ok to take Tylenol.   ____ Stop supplements until after surgery.    ____ Bring C-Pap to the hospital.

## 2016-04-19 ENCOUNTER — Encounter: Payer: Self-pay | Admitting: *Deleted

## 2016-04-20 ENCOUNTER — Encounter
Admission: RE | Disposition: A | Discharge status: Home / Self Care | Payer: Self-pay | Source: Ambulatory Visit | Attending: Unknown Physician Specialty

## 2016-04-20 ENCOUNTER — Encounter: Payer: Self-pay | Admitting: *Deleted

## 2016-04-20 ENCOUNTER — Ambulatory Visit: Payer: Medicaid Other | Admitting: Anesthesiology

## 2016-04-20 ENCOUNTER — Ambulatory Visit
Admission: RE | Admit: 2016-04-20 | Discharge: 2016-04-20 | Disposition: A | Discharge status: Home / Self Care | Payer: Medicaid Other | Source: Ambulatory Visit | Attending: Unknown Physician Specialty | Admitting: Unknown Physician Specialty

## 2016-04-20 DIAGNOSIS — E669 Obesity, unspecified: Secondary | ICD-10-CM | POA: Insufficient documentation

## 2016-04-20 DIAGNOSIS — M67432 Ganglion, left wrist: Secondary | ICD-10-CM | POA: Diagnosis present

## 2016-04-20 DIAGNOSIS — Z6841 Body Mass Index (BMI) 40.0 and over, adult: Secondary | ICD-10-CM | POA: Diagnosis not present

## 2016-04-20 DIAGNOSIS — Z833 Family history of diabetes mellitus: Secondary | ICD-10-CM | POA: Diagnosis not present

## 2016-04-20 DIAGNOSIS — G932 Benign intracranial hypertension: Secondary | ICD-10-CM | POA: Diagnosis not present

## 2016-04-20 DIAGNOSIS — Z79899 Other long term (current) drug therapy: Secondary | ICD-10-CM | POA: Insufficient documentation

## 2016-04-20 HISTORY — PX: GANGLION CYST EXCISION: SHX1691

## 2016-04-20 SURGERY — EXCISION, GANGLION CYST, WRIST
Anesthesia: General | Site: Wrist | Laterality: Left | Wound class: Clean

## 2016-04-20 MED ORDER — MIDAZOLAM HCL 2 MG/2ML IJ SOLN
INTRAMUSCULAR | Status: DC | PRN
  Administered 2016-04-20: 2 mg via INTRAVENOUS

## 2016-04-20 MED ORDER — LIDOCAINE HCL (CARDIAC) 20 MG/ML IV SOLN
INTRAVENOUS | Status: DC | PRN
  Administered 2016-04-20: 100 mg via INTRAVENOUS

## 2016-04-20 MED ORDER — HYDROCODONE-ACETAMINOPHEN 5-325 MG PO TABS
ORAL_TABLET | ORAL | Status: AC
  Filled 2016-04-20: qty 1

## 2016-04-20 MED ORDER — PROPOFOL 10 MG/ML IV BOLUS
INTRAVENOUS | Status: DC | PRN
  Administered 2016-04-20: 160 mg via INTRAVENOUS

## 2016-04-20 MED ORDER — DEXAMETHASONE SODIUM PHOSPHATE 10 MG/ML IJ SOLN
INTRAMUSCULAR | Status: DC | PRN
  Administered 2016-04-20: 4 mg via INTRAVENOUS

## 2016-04-20 MED ORDER — FENTANYL CITRATE (PF) 100 MCG/2ML IJ SOLN
25.0000 ug | INTRAMUSCULAR | Status: DC | PRN
  Administered 2016-04-20 (×4): 25 ug via INTRAVENOUS

## 2016-04-20 MED ORDER — FAMOTIDINE 20 MG PO TABS
20.0000 mg | ORAL_TABLET | Freq: Once | ORAL | Status: AC
  Administered 2016-04-20: 20 mg via ORAL

## 2016-04-20 MED ORDER — BUPIVACAINE HCL (PF) 0.5 % IJ SOLN
INTRAMUSCULAR | Status: AC
  Filled 2016-04-20: qty 30

## 2016-04-20 MED ORDER — LACTATED RINGERS IV SOLN
INTRAVENOUS | Status: DC
  Administered 2016-04-20: 50 mL/h via INTRAVENOUS

## 2016-04-20 MED ORDER — FAMOTIDINE 20 MG PO TABS
ORAL_TABLET | ORAL | Status: AC
  Administered 2016-04-20: 20 mg via ORAL
  Filled 2016-04-20: qty 1

## 2016-04-20 MED ORDER — HYDROCODONE-ACETAMINOPHEN 5-325 MG PO TABS
1.0000 | ORAL_TABLET | Freq: Four times a day (QID) | ORAL | Status: DC | PRN
  Administered 2016-04-20: 1 via ORAL

## 2016-04-20 MED ORDER — HYDROCODONE-ACETAMINOPHEN 5-325 MG PO TABS: 1.0000 | ORAL_TABLET | Freq: Four times a day (QID) | ORAL | 0 refills | Status: DC | PRN

## 2016-04-20 MED ORDER — PROMETHAZINE HCL 25 MG/ML IJ SOLN: 6.2500 mg | INTRAMUSCULAR | Status: DC | PRN

## 2016-04-20 MED ORDER — FENTANYL CITRATE (PF) 100 MCG/2ML IJ SOLN
INTRAMUSCULAR | Status: AC
  Filled 2016-04-20: qty 2

## 2016-04-20 MED ORDER — FENTANYL CITRATE (PF) 100 MCG/2ML IJ SOLN
INTRAMUSCULAR | Status: AC
  Administered 2016-04-20: 25 ug via INTRAVENOUS
  Filled 2016-04-20: qty 2

## 2016-04-20 MED ORDER — FENTANYL CITRATE (PF) 100 MCG/2ML IJ SOLN
INTRAMUSCULAR | Status: DC | PRN
  Administered 2016-04-20 (×3): 50 ug via INTRAVENOUS

## 2016-04-20 MED ORDER — ONDANSETRON HCL 4 MG/2ML IJ SOLN
INTRAMUSCULAR | Status: DC | PRN
  Administered 2016-04-20: 4 mg via INTRAVENOUS

## 2016-04-20 MED ORDER — BUPIVACAINE HCL (PF) 0.5 % IJ SOLN
INTRAMUSCULAR | Status: DC | PRN
  Administered 2016-04-20: 6 mL

## 2016-04-20 MED ORDER — PHENYLEPHRINE HCL 10 MG/ML IJ SOLN
INTRAMUSCULAR | Status: DC | PRN
  Administered 2016-04-20: 100 ug via INTRAVENOUS

## 2016-04-20 SURGICAL SUPPLY — 24 items
BANDAGE ELASTIC 3 LF NS (GAUZE/BANDAGES/DRESSINGS) ×2 IMPLANT
BNDG ESMARK 4X12 TAN STRL LF (GAUZE/BANDAGES/DRESSINGS) ×2 IMPLANT
CANISTER SUCT 1200ML W/VALVE (MISCELLANEOUS) ×2 IMPLANT
CAST PADDING 3X4FT ST 30246 (SOFTGOODS) ×1
CUFF TOURN 18 STER (MISCELLANEOUS) ×2 IMPLANT
DURAPREP 26ML APPLICATOR (WOUND CARE) ×2 IMPLANT
ELECT REM PT RETURN 9FT ADLT (ELECTROSURGICAL) ×2
ELECTRODE REM PT RTRN 9FT ADLT (ELECTROSURGICAL) ×1 IMPLANT
GAUZE SPONGE 4X4 12PLY STRL (GAUZE/BANDAGES/DRESSINGS) ×2 IMPLANT
GLOVE BIO SURGEON STRL SZ8 (GLOVE) ×2 IMPLANT
GLOVE BIOGEL M STRL SZ7.5 (GLOVE) ×2 IMPLANT
GLOVE INDICATOR 8.0 STRL GRN (GLOVE) ×2 IMPLANT
GOWN STRL REUS W/ TWL LRG LVL3 (GOWN DISPOSABLE) ×1 IMPLANT
GOWN STRL REUS W/TWL LRG LVL3 (GOWN DISPOSABLE) ×1
GOWN STRL REUS W/TWL LRG LVL4 (GOWN DISPOSABLE) ×2 IMPLANT
KIT RM TURNOVER STRD PROC AR (KITS) ×2 IMPLANT
NS IRRIG 500ML POUR BTL (IV SOLUTION) ×2 IMPLANT
PACK EXTREMITY ARMC (MISCELLANEOUS) ×2 IMPLANT
PAD CAST CTTN 3X4 STRL (SOFTGOODS) ×1 IMPLANT
SPLINT CAST 1 STEP 3X12 (MISCELLANEOUS) ×2 IMPLANT
STOCKINETTE STRL 4IN 9604848 (GAUZE/BANDAGES/DRESSINGS) ×2 IMPLANT
SUT ETHILON 4-0 (SUTURE) ×1
SUT ETHILON 4-0 FS2 18XMFL BLK (SUTURE) ×1
SUTURE ETHLN 4-0 FS2 18XMF BLK (SUTURE) ×1 IMPLANT

## 2016-04-20 NOTE — Anesthesia Procedure Notes (Addendum)
Procedure Name: LMA Insertion Performed by: Amorita Vanrossum Pre-anesthesia Checklist: Patient identified, Patient being monitored, Timeout performed, Emergency Drugs available and Suction available Patient Re-evaluated:Patient Re-evaluated prior to inductionOxygen Delivery Method: Circle system utilized Preoxygenation: Pre-oxygenation with 100% oxygen Intubation Type: IV induction LMA: LMA inserted LMA Size: 4.0 Tube type: Oral Number of attempts: 1 Placement Confirmation: positive ETCO2 and breath sounds checked- equal and bilateral Tube secured with: Tape Dental Injury: Teeth and Oropharynx as per pre-operative assessment        

## 2016-04-20 NOTE — Discharge Instructions (Signed)
Ice pack  ° °Elevation ° °Keep dressing dry ° °RTC in about 10 days ° °AMBULATORY SURGERY  °DISCHARGE INSTRUCTIONS ° ° °1) The drugs that you were given will stay in your system until tomorrow so for the next 24 hours you should not: ° °A) Drive an automobile °B) Make any legal decisions °C) Drink any alcoholic beverage ° ° °2) You may resume regular meals tomorrow.  Today it is better to start with liquids and gradually work up to solid foods. ° °You may eat anything you prefer, but it is better to start with liquids, then soup and crackers, and gradually work up to solid foods. ° ° °3) Please notify your doctor immediately if you have any unusual bleeding, trouble breathing, redness and pain at the surgery site, drainage, fever, or pain not relieved by medication. ° ° ° °4) Additional Instructions: ° ° ° ° ° ° ° °Please contact your physician with any problems or Same Day Surgery at [phone], Monday through Friday 6 am to 4 pm, or [facility] [place] Main number at [phone]. °

## 2016-04-20 NOTE — Transfer of Care (Signed)
Immediate Anesthesia Transfer of Care Note  Patient: Kristi Gutierrez  Procedure(s) Performed: Procedure(s): REMOVAL GANGLION OF WRIST (Left)  Patient Location: PACU  Anesthesia Type:General  Level of Consciousness: awake and alert   Airway & Oxygen Therapy: Patient Spontanous Breathing and Patient connected to face mask oxygen  Post-op Assessment: Report given to RN and Post -op Vital signs reviewed and stable  Post vital signs: Reviewed and stable  Last Vitals:  Vitals:   04/20/16 1028 04/20/16 1033  BP: 128/74 128/74  Pulse: 85 84  Resp: 13 15  Temp: 36.3 C     Last Pain:  Vitals:   04/20/16 1028  TempSrc:   PainSc: 7       Patients Stated Pain Goal: 0 [phone]  Complications: No apparent anesthesia complications

## 2016-04-20 NOTE — Anesthesia Preprocedure Evaluation (Signed)
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:   Pulmonary neg pulmonary ROS,    Pulmonary exam normal breath sounds clear to auscultation       Cardiovascular Exercise Tolerance: Good negative cardio ROS Normal cardiovascular exam Rhythm:regular Rate:Normal     Neuro/Psych  Headaches, Pseudotumor cerebri negative psych ROS   GI/Hepatic negative GI ROS, Neg liver ROS,   Endo/Other  neg diabetesMorbid obesity  Renal/GU negative Renal ROS  negative genitourinary   Musculoskeletal   Abdominal   Peds  Hematology negative hematology ROS (+)   Anesthesia Other Findings Past Medical History: No date: Migraine     Comment: currently active, uses Excedrin No date: Pseudotumor cerebri   Reproductive/Obstetrics negative OB ROS                             Anesthesia Physical Anesthesia Plan  ASA: III  Anesthesia Plan: General   Post-op Pain Management:    Induction:   Airway Management Planned:   Additional Equipment:   Intra-op Plan:   Post-operative Plan:   Informed Consent: I have reviewed the patients History and Physical, chart, labs and discussed the procedure including the risks, benefits and alternatives for the proposed anesthesia with the patient or authorized representative who has indicated his/her understanding and acceptance.   Dental Advisory Given  Plan Discussed with: Anesthesiologist, CRNA and Surgeon  Anesthesia Plan Comments:         Anesthesia Quick Evaluation

## 2016-04-20 NOTE — Anesthesia Postprocedure Evaluation (Signed)
Anesthesia Post Note  Patient: Kristi Gutierrez  Procedure(s) Performed: Procedure(s) (LRB): REMOVAL GANGLION OF WRIST (Left)  Patient location during evaluation: PACU Anesthesia Type: General Level of consciousness: awake and alert Pain management: pain level controlled Vital Signs Assessment: post-procedure vital signs reviewed and stable Respiratory status: spontaneous breathing, nonlabored ventilation, respiratory function stable and patient connected to nasal cannula oxygen Cardiovascular status: blood pressure returned to baseline and stable Postop Assessment: no signs of nausea or vomiting Anesthetic complications: no    Last Vitals:  Vitals:   04/20/16 1124 04/20/16 1149  BP: 118/64 (!) 108/59  Pulse: 68 66  Resp: 18 17  Temp: 37 C     Last Pain:  Vitals:   04/20/16 1124  TempSrc:   PainSc: 7                  Martha Clan

## 2016-04-20 NOTE — Op Note (Signed)
      PATIENT:Gutierrez, Kristi Schneiders  PRE-OPERATIVE DIAGNOSIS: Left  WRIST GANGLION M67.431  POST-OPERATIVE DIAGNOSIS: Left  WRIST GANGLION plus nodule distal phalanx left little finger  PROCEDURE: Excision left wrist ganglion and small nodule from the distal phalanx left finger  SURGEON: Mariel Kansky., MD  ASSISTANTS: None  ANESTHESIA: Gen.  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 The patient also had a small nodule about the ulnar aspect of the distal phalanx of the left little finger.  Because of the discomfort the patient was desirous of having both the ganglion and the small mass excised.   OP NOTE: The patient was taken the operating room where satisfactory  general anesthesia was achieved. A tourniquet was applied to the left upper extremity. The left upper extremity was prepped and draped in usual fashion for a procedure about the wrist and hand. The left upper extremity was exsanguinated and the tourniquet was inflated. About a 1 inch transverse incision was made over the left wrist dorsal ganglion. I bluntly dissected down to the ganglion cyst. The cyst was attached to the dorsal retinaculum. I excised the cyst in its entirety.. Some clear gelatinous material was expressed. The cyst did not penetrate the dorsal retinaculum.   I used cutting cautery to further debride any possible remaining ganglionic material. Next I made about 1 cm incision over the small mass located along the ulnar aspect of the distal phalanx of the left little finger. I dissected down to the mass. It seemed to be a combination of a bony osteophyte and soft tissue thickening. I did excise  the prominent bone and thickened soft tissue. The tourniquet was released. It was up for about 19 minutes. Bleeding was controlled with coagulation cautery. I closed the dorsal wrist incision subcutaneous tissue with 3-0 Vicryl. The skin  was closed with a running 3-0 subcuticular suture. The left little finger incision was closed with 4-0 nylon sutures in vertical mattress fashion. A dressing was applied to the left wrist and left little finger. A fiberglass splint was then applied to the left wrist  The patient then awakened and transferred to a stretcher bed. The patient was then  taken to the recovery room in satisfactory condition. Blood loss was negligible.

## 2016-04-20 NOTE — H&P (Signed)
  H and P reviewed. No changes. Uploaded at later date. 

## 2016-04-25 LAB — POCT PREGNANCY, URINE: PREG TEST UR: NEGATIVE

## 2016-06-24 IMAGING — CT CT HEAD W/O CM
3 series · 16 of 47 positions shown, 19 images · non-contrast
Comparison: Head CT -04/20/2014

CLINICAL DATA: Severe migraine for the past 3 days. Nausea and
vomiting.

EXAM:
CT HEAD WITHOUT CONTRAST
TECHNIQUE: Contiguous axial images were obtained from the base of the skull
through the vertex without intravenous contrast.

[Series 2: head wo · axial · 0.42mm/px · z∈[-112,+18]mm · 10 of 32 slices shown, 13 images]
[im 3/32  brain]
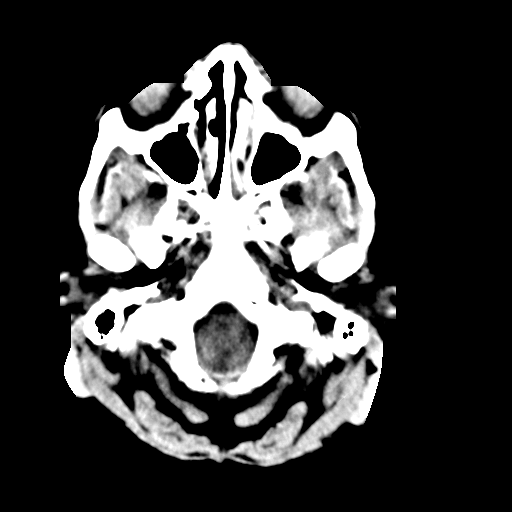
[im 3/32  bone]
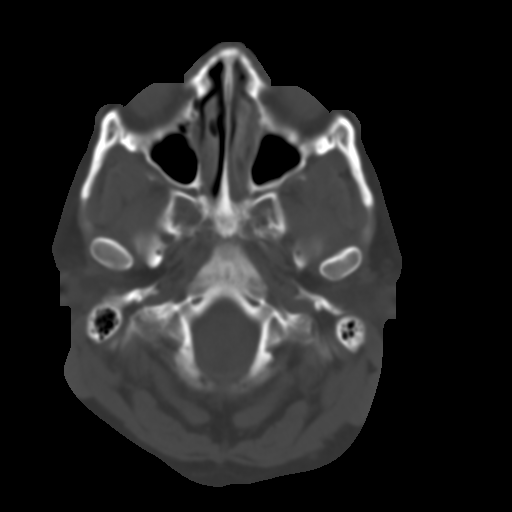
[im 6/32  brain]
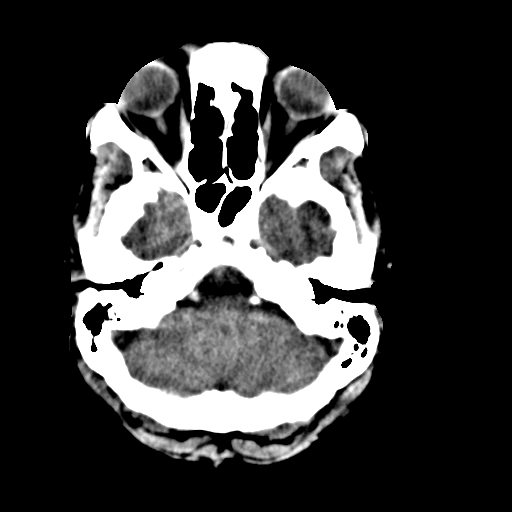
[im 9/32  brain]
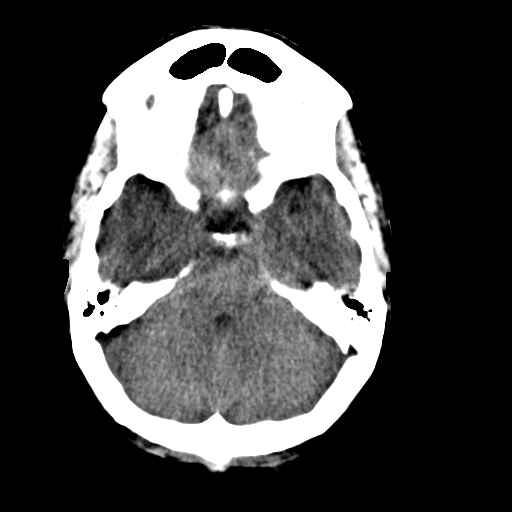
[im 11/32  brain]
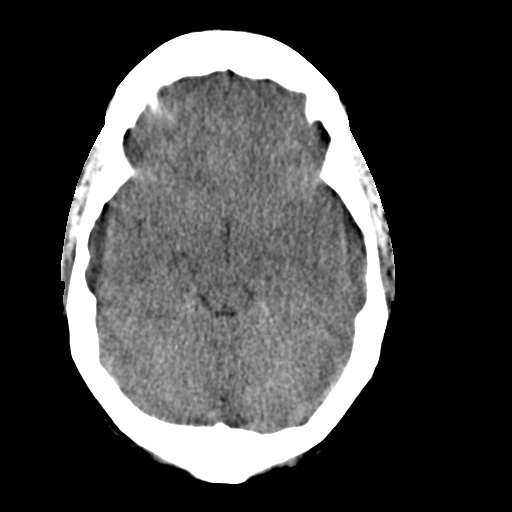
[im 14/32  brain]
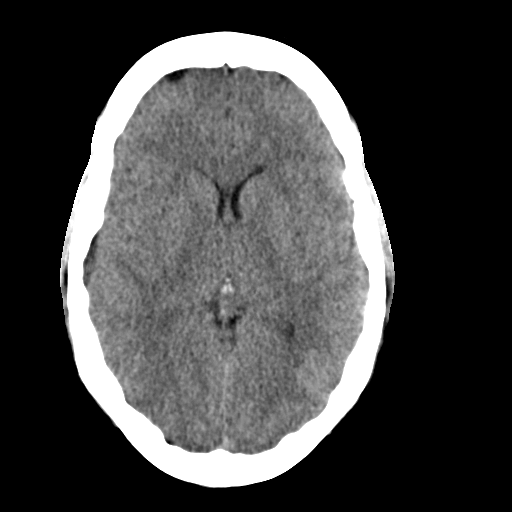
[im 14/32  bone]
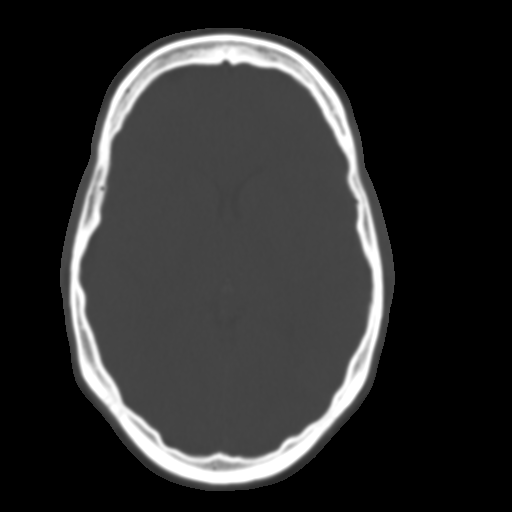
[im 18/32  brain]
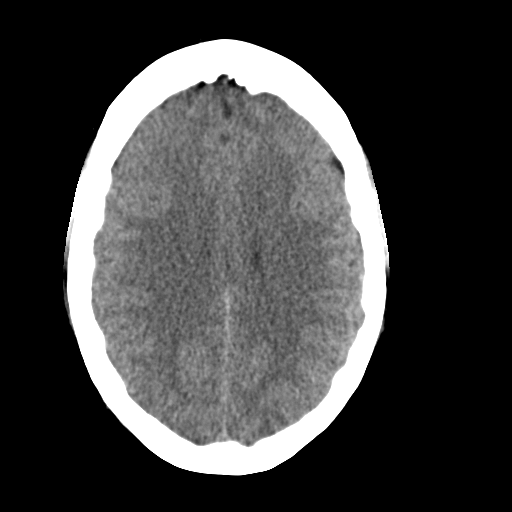
[im 21/32  brain]
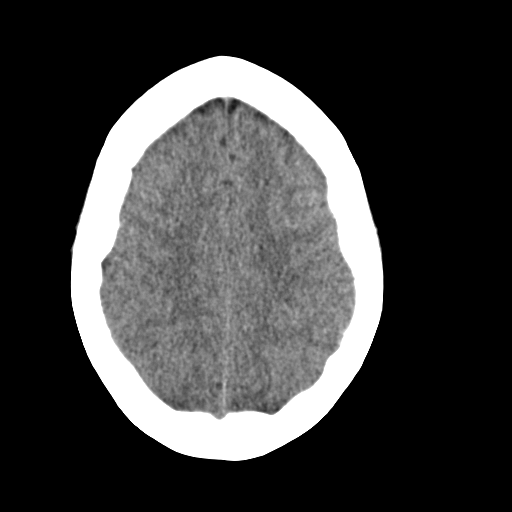
[im 24/32  brain]
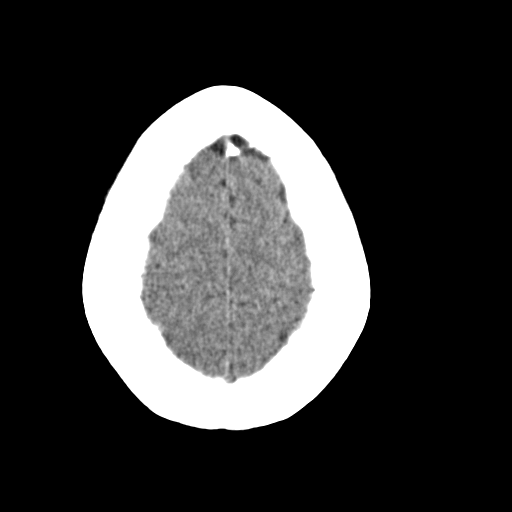
[im 26/32  brain]
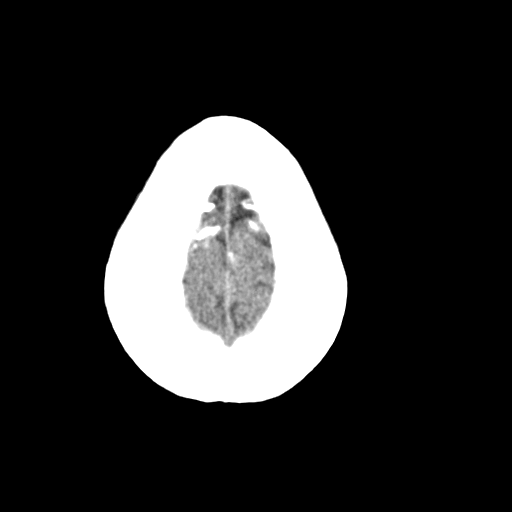
[im 26/32  bone]
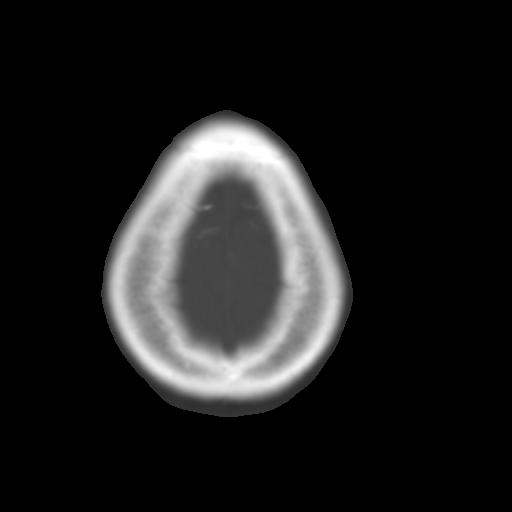
[im 29/32  brain]
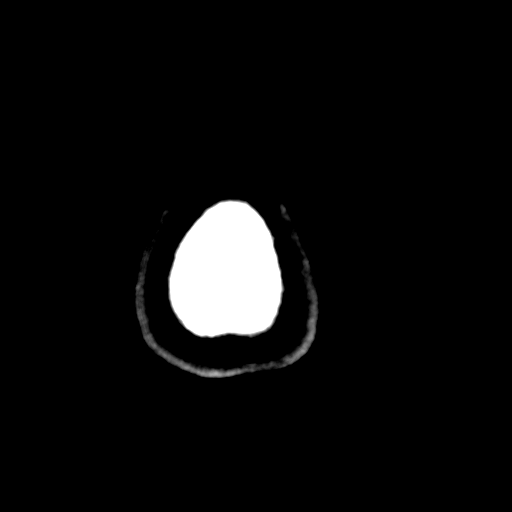

[Series 4: coronal soft · coronal · 0.30mm/px · 3 of 69 slices shown]
[im 23/69  brain]
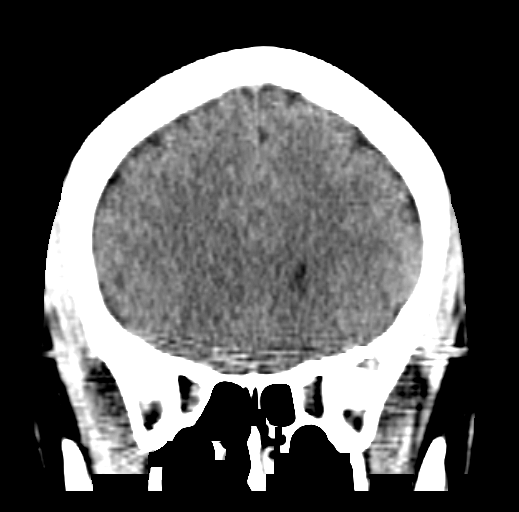
[im 31/69  brain]
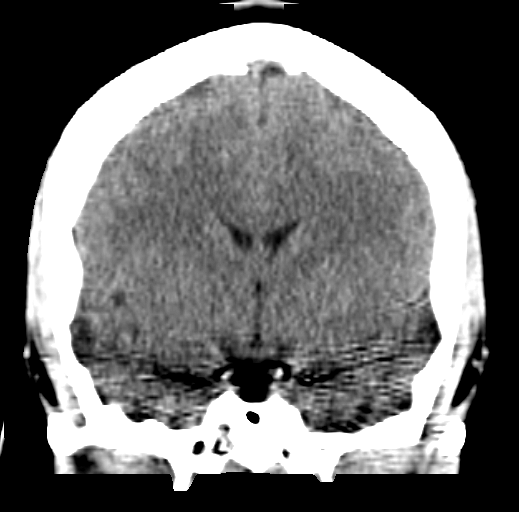
[im 38/69  brain]
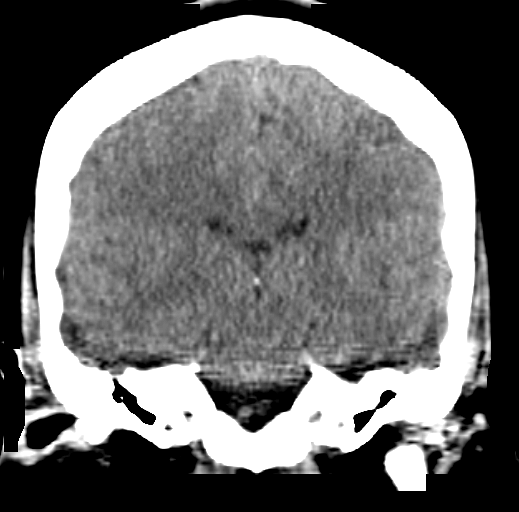

[Series 5: sagittal soft · sagittal · 0.32mm/px · 3 of 54 slices shown]
[im 18/54  brain]
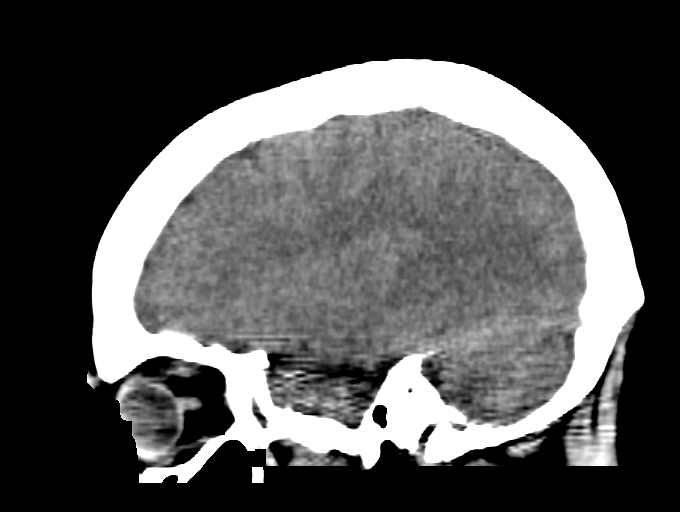
[im 27/54  brain]
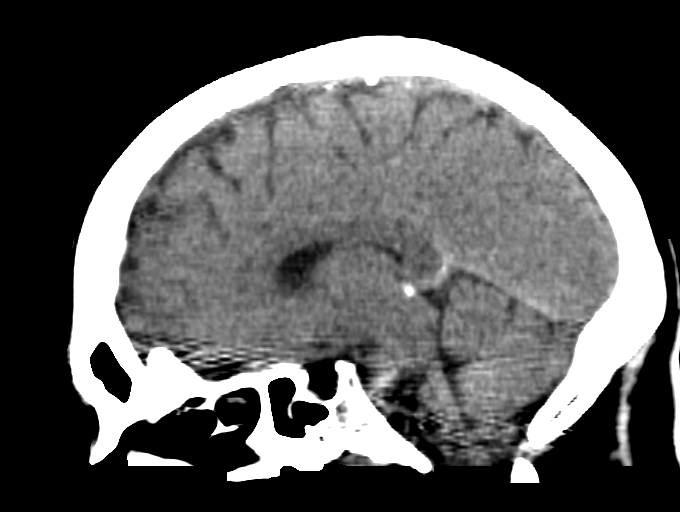
[im 36/54  brain]
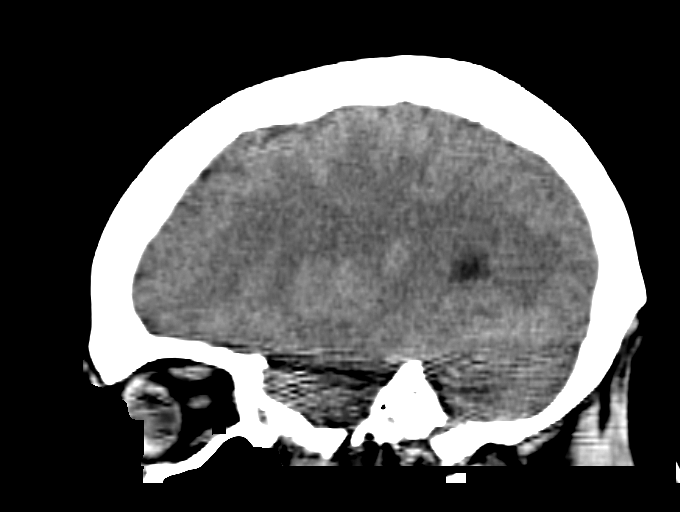

[16 of 47 positions shown; findings below may reference images not displayed]

FINDINGS: Brain: Gray-white differentiation is maintained. No CT evidence of
acute large territory infarct. No intraparenchymal or extra-axial
mass or hemorrhage. Normal size and configuration of the ventricles
and basilar cisterns. No midline shift.

Vascular: No hyperdense vessel or unexpected calcification.

Skull: Negative for fracture or focal lesion.

Sinuses/Orbits: Limited visualization the paranasal sinuses and
mastoid air cells is normal. No air-fluid levels.

Other: Regional soft tissues appear normal.
IMPRESSION: Negative noncontrast head CT.

## 2016-07-26 ENCOUNTER — Emergency Department
Admission: EM | Admit: 2016-07-26 | Discharge: 2016-07-26 | Disposition: A | Discharge status: Home / Self Care | Payer: Medicaid Other | Attending: Emergency Medicine | Admitting: Emergency Medicine

## 2016-07-26 ENCOUNTER — Encounter: Payer: Self-pay | Admitting: *Deleted

## 2016-07-26 DIAGNOSIS — I808 Phlebitis and thrombophlebitis of other sites: Secondary | ICD-10-CM | POA: Diagnosis not present

## 2016-07-26 DIAGNOSIS — M79601 Pain in right arm: Secondary | ICD-10-CM | POA: Diagnosis present

## 2016-07-26 MED ORDER — HYDROCODONE-ACETAMINOPHEN 5-325 MG PO TABS: 1.0000 | ORAL_TABLET | Freq: Four times a day (QID) | ORAL | 0 refills | Status: DC | PRN

## 2016-07-26 MED ORDER — TRAMADOL HCL 50 MG PO TABS
50.0000 mg | ORAL_TABLET | Freq: Once | ORAL | Status: AC
  Administered 2016-07-26: 50 mg via ORAL
  Filled 2016-07-26: qty 1

## 2016-07-26 NOTE — ED Provider Notes (Signed)
[facility] Emergency Department Provider Note ____________________________________________  Time seen: 2307  I have reviewed the triage vital signs and the nursing notes.  HISTORY  Chief Complaint  Arm Pain  HPI Kristi Gutierrez is a 35 y.o. female presents to the ED for evaluation of pain to the antecubital portion of her right arm for the last 3-4 weeks. Patient describes feeling a small "knot" on his right elbow. She describes areas. Tender to touch. Due to the pain related to this not she has some limited range of motion of her upper extremity. She denies any fevers, chills, sweats. She also denies any chest pain, shortness of breath. She does report some mild tingling in the distal portion of the hand. She describes her arms feeling heavy due to the pain at the elbow. She has not seen a provider nor she followed up with [place] though in the last 4 weeks. She does give a history of a recent left dorsal wrist ganglion cyst excision. She denies any recent venipunctures, trauma, or injury to the right antecubital region or forearm.  Past Medical History:  Diagnosis Date  . Migraine    currently active, uses Excedrin  . Pseudotumor cerebri     Patient Active Problem List   Diagnosis Date Noted  . Admission for sterilization 03/01/2015    Past Surgical History:  Procedure Laterality Date  . ATTEMPTED BTL-HAD TO ABORT PROCEDURE  10-2014  . CYST EXCISION    . GANGLION CYST EXCISION Left 12/26/2015   Procedure: REMOVAL GANGLION OF WRIST;  Surgeon: Earnestine Leys, MD;  Location: [facility] ORS;  Service: Orthopedics;  Laterality: Left;  . GANGLION CYST EXCISION Left 04/20/2016   Procedure: REMOVAL GANGLION OF WRIST;  Surgeon: Leanor Kail, MD;  Location: [facility] ORS;  Service: Orthopedics;  Laterality: Left;  . LAPAROSCOPIC TUBAL LIGATION Bilateral 03/01/2015   Procedure: LAPAROSCOPIC TUBAL LIGATION;  Surgeon: Will Bonnet, MD;  Location: [facility] ORS;  Service: Gynecology;   Laterality: Bilateral;  . MOUTH SURGERY    . TUBAL LIGATION      Prior to Admission medications   Medication Sig Start Date End Date Taking? Authorizing Provider  HYDROcodone-acetaminophen (NORCO) 5-325 MG tablet Take 1 tablet by mouth every 6 (six) hours as needed. 07/26/16   Brantley Wiley V Bacon Maksym Pfiffner, PA-C    Allergies Review of patient's allergies indicates no known allergies.  No family history on file.  Social History Social History  Substance Use Topics  . Smoking status: Never Smoker  . Smokeless tobacco: Never Used  . Alcohol use No     Comment: occ    Review of Systems  Constitutional: Negative for fever. Cardiovascular: Negative for chest pain. Respiratory: Negative for shortness of breath. Gastrointestinal: Negative for abdominal pain, vomiting and diarrhea. Musculoskeletal: Negative for back pain. Right anterior tibial pain as above. Skin: Negative for rash. Neurological: Negative for headaches, focal weakness or numbness. ____________________________________________  PHYSICAL EXAM:  VITAL SIGNS: ED Triage Vitals  Enc Vitals Group     BP 07/26/16 2216 (!) 120/54     Pulse Rate 07/26/16 2216 80     Resp 07/26/16 2216 18     Temp 07/26/16 2216 98.5 F (36.9 C)     Temp Source 07/26/16 2216 Oral     SpO2 07/26/16 2216 99 %     Weight 07/26/16 2218 276 lb (125.2 kg)     Height 07/26/16 2218 5\' 6"  (1.676 m)     Head Circumference --  Peak Flow --      Pain Score 07/26/16 2218 9     Pain Loc --      Pain Edu? --      Excl. in [place]? --     Constitutional: Alert and oriented. Well appearing and in no distress. Head: Normocephalic and atraumatic. Cardiovascular: Normal rate, regular rhythm. Normal distal pulses. Respiratory: Normal respiratory effort. No wheezes/rales/rhonchi. Musculoskeletal: Patient with the right upper extremities held in a flexed, adducted position. She has tenderness to the antecubital portion of the proximal forearm. There is a  small palpable, superficial cystic type lesion felt in the soft tissues. It appears to be mobile and soft in nature. Nontender with normal range of motion in all other extremities.  Neurologic:  Normal composite fist. Normal intrinsic and opposition testing. Normal speech and language. No gross focal neurologic deficits are appreciated. Skin:  Skin is warm, dry and intact. No rash, bruise, ecchymosis noted. Psychiatric: Mood and affect are normal. Patient exhibits appropriate insight and judgment. _____________________________________________  PROCEDURES  Ultram 50 mg PO ____________________________________________  INITIAL IMPRESSION / ASSESSMENT AND PLAN / ED COURSE  Patient with pain to the antecubital right forearm. Her symptoms appear to be consistent with a superficial phlebitis of the right arm. No neurovascular deficits are appreciated. Patient with an otherwise benign exam is discharged with pain medicines. She is advised to follow-up with [facility] or her primary care provider for further evaluation. She'll apply warm compresses and return to the ED if symptoms worsen acutely.  Clinical Course   ____________________________________________  FINAL CLINICAL IMPRESSION(S) / ED DIAGNOSES  Final diagnoses:  Superficial phlebitis of arm  Right arm pain      Melvenia Needles, PA-C 07/26/16 2354    Nance Pear, MD 07/26/16 2359

## 2016-07-26 NOTE — ED Triage Notes (Addendum)
Pt reports her antecubital has a 'Knot" in it and her arm hurts. No injury to arm.  No chest pain or sob.  States it hurts to bend arm.  No abscess noted.  No redness noted to arm.  Pt states she has had cyst removed from other arm and this feels similar.

## 2016-07-26 NOTE — Discharge Instructions (Signed)
Take the pain medicine as directed. Apply warm compresses and follow-up with [facility] or your provider as needed.

## 2016-10-22 ENCOUNTER — Emergency Department
Admission: EM | Admit: 2016-10-22 | Discharge: 2016-10-23 | Disposition: A | Discharge status: Home / Self Care | Payer: Medicaid Other | Attending: Student in an Organized Health Care Education/Training Program | Admitting: Student in an Organized Health Care Education/Training Program

## 2016-10-22 ENCOUNTER — Encounter: Payer: Self-pay | Admitting: Emergency Medicine

## 2016-10-22 ENCOUNTER — Emergency Department: Payer: Medicaid Other

## 2016-10-22 DIAGNOSIS — M25562 Pain in left knee: Secondary | ICD-10-CM | POA: Diagnosis not present

## 2016-10-22 DIAGNOSIS — Y999 Unspecified external cause status: Secondary | ICD-10-CM | POA: Diagnosis not present

## 2016-10-22 DIAGNOSIS — Y929 Unspecified place or not applicable: Secondary | ICD-10-CM | POA: Diagnosis not present

## 2016-10-22 DIAGNOSIS — Y939 Activity, unspecified: Secondary | ICD-10-CM | POA: Diagnosis not present

## 2016-10-22 DIAGNOSIS — X501XXA Overexertion from prolonged static or awkward postures, initial encounter: Secondary | ICD-10-CM | POA: Insufficient documentation

## 2016-10-22 DIAGNOSIS — S8992XA Unspecified injury of left lower leg, initial encounter: Secondary | ICD-10-CM | POA: Diagnosis present

## 2016-10-22 MED ORDER — MELOXICAM 7.5 MG PO TABS: 7.5000 mg | ORAL_TABLET | Freq: Every day | ORAL | Status: DC

## 2016-10-22 MED ORDER — LIDOCAINE 5 % EX PTCH
1.0000 | MEDICATED_PATCH | CUTANEOUS | Status: DC
  Administered 2016-10-22: 1 via TRANSDERMAL
  Filled 2016-10-22: qty 1

## 2016-10-22 MED ORDER — HYDROCODONE-ACETAMINOPHEN 5-325 MG PO TABS: 1.0000 | ORAL_TABLET | Freq: Four times a day (QID) | ORAL | 0 refills | Status: DC | PRN

## 2016-10-22 NOTE — ED Provider Notes (Signed)
[facility] Emergency Department Provider Note  ____________________________________________  Time seen: Approximately 9:54 PM  I have reviewed the triage vital signs and the nursing notes.   HISTORY  Chief Complaint Knee Pain    HPI Kristi Gutierrez is a 36 y.o. female that presents to the emergency department after hearing a pop in her left ED knee earlier tonight. She states that she has had pain on and off for the last 2 days in left knee. Patient states she was standing up from couch and heard a loud pop. Patient has not been able to walk on leg since. Patient is unable to characterize pain.Patient has full range of motion of ankle. Patient can feel and wiggle toes. Patient denies any injury or previous difficulty with that knee. Patient has not taken anything for pain.   Past Medical History:  Diagnosis Date  . Migraine    currently active, uses Excedrin  . Pseudotumor cerebri     Patient Active Problem List   Diagnosis Date Noted  . Admission for sterilization 03/01/2015    Past Surgical History:  Procedure Laterality Date  . ATTEMPTED BTL-HAD TO ABORT PROCEDURE  10-2014  . CYST EXCISION    . GANGLION CYST EXCISION Left 12/26/2015   Procedure: REMOVAL GANGLION OF WRIST;  Surgeon: Earnestine Leys, MD;  Location: [facility] ORS;  Service: Orthopedics;  Laterality: Left;  . GANGLION CYST EXCISION Left 04/20/2016   Procedure: REMOVAL GANGLION OF WRIST;  Surgeon: Leanor Kail, MD;  Location: [facility] ORS;  Service: Orthopedics;  Laterality: Left;  . LAPAROSCOPIC TUBAL LIGATION Bilateral 03/01/2015   Procedure: LAPAROSCOPIC TUBAL LIGATION;  Surgeon: Will Bonnet, MD;  Location: [facility] ORS;  Service: Gynecology;  Laterality: Bilateral;  . MOUTH SURGERY    . TUBAL LIGATION      Prior to Admission medications   Medication Sig Start Date End Date Taking? Authorizing Provider  HYDROcodone-acetaminophen (NORCO/VICODIN) 5-325 MG tablet Take 1 tablet by mouth every 6  (six) hours as needed for moderate pain. 10/22/16   Laban Emperor, PA-C    Allergies Patient has no known allergies.  History reviewed. No pertinent family history.  Social History Social History  Substance Use Topics  . Smoking status: Never Smoker  . Smokeless tobacco: Never Used  . Alcohol use No     Comment: occ     Review of Systems  Constitutional: No fever/chills ENT: No upper respiratory complaints. Cardiovascular: No chest pain. Respiratory: No SOB. Gastrointestinal: No abdominal pain.  No nausea, no vomiting.  Skin: Negative for rash, abrasions, lacerations, ecchymosis. Neurological: Negative for headaches, numbness or tingling   ____________________________________________   PHYSICAL EXAM:  VITAL SIGNS: ED Triage Vitals  Enc Vitals Group     BP 10/22/16 2138 138/75     Pulse Rate 10/22/16 2137 90     Resp 10/22/16 2137 16     Temp 10/22/16 2137 98 F (36.7 C)     Temp Source 10/22/16 2137 Oral     SpO2 10/22/16 2137 98 %     Weight 10/22/16 2137 248 lb (112.5 kg)     Height 10/22/16 2137 5\' 6"  (1.676 m)     Head Circumference --      Peak Flow --      Pain Score --      Pain Loc --      Pain Edu? --      Excl. in [place]? --      Constitutional: Alert and oriented. Well appearing and in  no acute distress. Eyes: Conjunctivae are normal. PERRL. EOMI. Head: Atraumatic. ENT:      Ears:      Nose: No congestion/rhinnorhea.      Mouth/Throat: Mucous membranes are moist.  Neck: No stridor.   Cardiovascular: Normal rate, regular rhythm.  Good peripheral circulation. 2+ dorsalis pedis and posterior tibialis pulses. Respiratory: Normal respiratory effort without tachypnea or retractions. Lungs CTAB. Good air entry to the bases with no decreased or absent breath sounds. Musculoskeletal: Knee exam limited due to patient complaining of extreme pain on all sides of knee. Tenderness to palpation at every point on knee. No gross deformities  appreciated. Neurologic:  Normal speech and language. No gross focal neurologic deficits are appreciated.  Skin:  Skin is warm, dry and intact. No rash noted. Sensation of toes intact. Psychiatric: Mood and affect are normal. Speech and behavior are normal. Patient exhibits appropriate insight and judgement.   ____________________________________________   LABS (all labs ordered are listed, but only abnormal results are displayed)  Labs Reviewed - No data to display ____________________________________________  EKG   ____________________________________________  RADIOLOGY Robinette Haines, personally viewed and evaluated these images (plain radiographs) as part of my medical decision making, as well as reviewing the written report by the radiologist.  Dg Knee Complete 4 Views Left  Result Date: 10/22/2016 CLINICAL DATA:  Nontraumatic left knee pain for 2 days. EXAM: LEFT KNEE - COMPLETE 4+ VIEW COMPARISON:  12/10/2013 FINDINGS: No evidence of fracture, dislocation, or joint effusion. No evidence of arthropathy or other focal bone abnormality. Soft tissues are unremarkable. IMPRESSION: Negative. Electronically Signed   By: Andreas Newport M.D.   On: 10/22/2016 22:39    ____________________________________________    PROCEDURES  Procedure(s) performed:    Procedures    Medications  lidocaine (LIDODERM) 5 % 1 patch (1 patch Transdermal Patch Applied 10/22/16 2233)  meloxicam (MOBIC) tablet 7.5 mg (not administered)     ____________________________________________   INITIAL IMPRESSION / ASSESSMENT AND PLAN / ED COURSE  Pertinent labs & imaging results that were available during my care of the patient were reviewed by me and considered in my medical decision making (see chart for details).  Review of the [place] CSRS was performed in accordance of the [place] prior to dispensing any controlled drugs.     Patient's diagnosis is consistent with Knee pain. Vital signs and  exam are reassuring. Knee exam limited due to patient barely allowing me to touch knee because of extreme pain.  X-ray negative for any acute bony abnormalities. Patient was given lidocaine patch and meloxicam in ED. Patient will be discharged home with prescriptions for a very limited supply of Vicodin. Patient is to follow up with ortho as directed. Patient is given ED precautions to return to the ED for any worsening or new symptoms.     ____________________________________________  FINAL CLINICAL IMPRESSION(S) / ED DIAGNOSES  Final diagnoses:  Acute pain of left knee      NEW MEDICATIONS STARTED DURING THIS VISIT:  New Prescriptions   HYDROCODONE-ACETAMINOPHEN (NORCO/VICODIN) 5-325 MG TABLET    Take 1 tablet by mouth every 6 (six) hours as needed for moderate pain.        This chart was dictated using voice recognition software/Dragon. Despite best efforts to proofread, errors can occur which can change the meaning. Any change was purely unintentional.    Laban Emperor, PA-C 10/23/16 0000    Merlyn Lot, MD 10/23/16 [phone]

## 2016-10-22 NOTE — ED Triage Notes (Signed)
Pt to triage in wheelchair. Pt c/o left knee pain x2 days, denies injury. Pt reports she can not put wait on knee since hearing a "Pop" tonight as she was sitting down on couch.

## 2016-10-23 MED ORDER — MELOXICAM 7.5 MG PO TABS
7.5000 mg | ORAL_TABLET | Freq: Once | ORAL | Status: AC
  Administered 2016-10-23: 7.5 mg via ORAL

## 2016-10-23 MED ORDER — MELOXICAM 7.5 MG PO TABS
ORAL_TABLET | ORAL | Status: AC
  Administered 2016-10-23: 7.5 mg via ORAL
  Filled 2016-10-23: qty 1

## 2016-10-23 NOTE — ED Notes (Signed)

## 2017-02-20 ENCOUNTER — Emergency Department
Admission: EM | Admit: 2017-02-20 | Discharge: 2017-02-20 | Disposition: A | Discharge status: Home / Self Care | Payer: Medicaid Other | Attending: Emergency Medicine | Admitting: Emergency Medicine

## 2017-02-20 ENCOUNTER — Encounter: Payer: Self-pay | Admitting: Emergency Medicine

## 2017-02-20 ENCOUNTER — Emergency Department: Payer: Medicaid Other

## 2017-02-20 DIAGNOSIS — R109 Unspecified abdominal pain: Secondary | ICD-10-CM | POA: Diagnosis present

## 2017-02-20 DIAGNOSIS — N3090 Cystitis, unspecified without hematuria: Secondary | ICD-10-CM | POA: Diagnosis not present

## 2017-02-20 LAB — COMPREHENSIVE METABOLIC PANEL
ALBUMIN: 4 g/dL (ref 3.5–5.0)
ALT: 99 U/L — AB (ref 14–54)
AST: 61 U/L — AB (ref 15–41)
Alkaline Phosphatase: 93 U/L (ref 38–126)
Anion gap: 8 (ref 5–15)
BILIRUBIN TOTAL: 0.9 mg/dL (ref 0.3–1.2)
BUN: 11 mg/dL (ref 6–20)
CO2: 25 mmol/L (ref 22–32)
CREATININE: 0.71 mg/dL (ref 0.44–1.00)
Calcium: 9.4 mg/dL (ref 8.9–10.3)
Chloride: 105 mmol/L (ref 101–111)
GFR calc Af Amer: >60 mL/min (ref >60)
GLUCOSE: 106 mg/dL — AB (ref 65–99)
POTASSIUM: 3.7 mmol/L (ref 3.5–5.1)
Sodium: 138 mmol/L (ref 135–145)
TOTAL PROTEIN: 7.8 g/dL (ref 6.5–8.1)

## 2017-02-20 LAB — CBC
HEMATOCRIT: 40.3 % (ref 35.0–47.0)
HEMOGLOBIN: 13.8 g/dL (ref 12.0–16.0)
MCH: 30.3 pg (ref 26.0–34.0)
MCHC: 34.1 g/dL (ref 32.0–36.0)
MCV: 88.8 fL (ref 80.0–100.0)
Platelets: 192 10*3/uL (ref 150–440)
RBC: 4.54 MIL/uL (ref 3.80–5.20)
RDW: 12.9 % (ref 11.5–14.5)
WBC: 8.8 10*3/uL (ref 3.6–11.0)

## 2017-02-20 LAB — URINALYSIS, COMPLETE (UACMP) WITH MICROSCOPIC
Bilirubin Urine: NEGATIVE
Glucose, UA: NEGATIVE mg/dL
KETONES UR: NEGATIVE mg/dL
Nitrite: NEGATIVE
PH: 5 (ref 5.0–8.0)
PROTEIN: NEGATIVE mg/dL
Specific Gravity, Urine: 1.023 (ref 1.005–1.030)

## 2017-02-20 LAB — POCT PREGNANCY, URINE: Preg Test, Ur: NEGATIVE

## 2017-02-20 MED ORDER — HYDROMORPHONE HCL 1 MG/ML IJ SOLN
1.0000 mg | Freq: Once | INTRAMUSCULAR | Status: AC
  Administered 2017-02-20: 1 mg via INTRAMUSCULAR
  Filled 2017-02-20: qty 1

## 2017-02-20 MED ORDER — ONDANSETRON 4 MG PO TBDP: 4.0000 mg | ORAL_TABLET | Freq: Three times a day (TID) | ORAL | 0 refills | Status: DC | PRN

## 2017-02-20 MED ORDER — ONDANSETRON 4 MG PO TBDP
8.0000 mg | ORAL_TABLET | Freq: Once | ORAL | Status: AC
  Administered 2017-02-20: 8 mg via ORAL
  Filled 2017-02-20: qty 2

## 2017-02-20 MED ORDER — CEPHALEXIN 500 MG PO CAPS: 500.0000 mg | ORAL_CAPSULE | Freq: Two times a day (BID) | ORAL | 0 refills | Status: DC

## 2017-02-20 MED ORDER — NAPROXEN 500 MG PO TABS: 500.0000 mg | ORAL_TABLET | Freq: Two times a day (BID) | ORAL | 0 refills | Status: DC

## 2017-02-20 NOTE — Discharge Instructions (Signed)
Your labs, CT scan of the abdomen/pelvis, and pelvic ultrasound today were unremarkable. Your urine test does look like he have a urinary tract infection. Take the Keflex antibiotic as prescribed to treat this and follow up with your doctor.

## 2017-02-20 NOTE — ED Provider Notes (Signed)
[facility] Emergency Department Provider Note  ____________________________________________  Time seen: Approximately 3:12 PM  I have reviewed the triage vital signs and the nursing notes.   HISTORY  Chief Complaint Flank Pain    HPI Kristi Gutierrez is a 36 y.o. female complains of sudden onset of right flank pain at 9:30 AM this morning while she was sitting down on the toilet to urinate. It is been constant since then, waxing and waning. Not able to find a comfortable position. Rates it as severe in intensity. Worse with movement. No alleviating factors. No vomiting or fever or diarrhea or constipation. Pain is sharp. Pain radiates to right lower quadrant.  Has a history of ovarian cysts. Has a history of bilateral tubal ligation. No dysuria frequency or urgency. No hematuria.  Past Medical History:  Diagnosis Date  . Migraine    currently active, uses Excedrin  . Pseudotumor cerebri      Patient Active Problem List   Diagnosis Date Noted  . Admission for sterilization 03/01/2015     Past Surgical History:  Procedure Laterality Date  . ATTEMPTED BTL-HAD TO ABORT PROCEDURE  10-2014  . CYST EXCISION    . GANGLION CYST EXCISION Left 12/26/2015   Procedure: REMOVAL GANGLION OF WRIST;  Surgeon: Earnestine Leys, MD;  Location: [facility] ORS;  Service: Orthopedics;  Laterality: Left;  . GANGLION CYST EXCISION Left 04/20/2016   Procedure: REMOVAL GANGLION OF WRIST;  Surgeon: Leanor Kail, MD;  Location: [facility] ORS;  Service: Orthopedics;  Laterality: Left;  . LAPAROSCOPIC TUBAL LIGATION Bilateral 03/01/2015   Procedure: LAPAROSCOPIC TUBAL LIGATION;  Surgeon: Will Bonnet, MD;  Location: [facility] ORS;  Service: Gynecology;  Laterality: Bilateral;  . MOUTH SURGERY    . TUBAL LIGATION       Prior to Admission medications   Medication Sig Start Date End Date Taking? Authorizing Provider  cephALEXin (KEFLEX) 500 MG capsule Take 1 capsule (500 mg total) by mouth 2  (two) times daily. 02/20/17   Carrie Mew, MD  HYDROcodone-acetaminophen (NORCO/VICODIN) 5-325 MG tablet Take 1 tablet by mouth every 6 (six) hours as needed for moderate pain. 10/22/16   Laban Emperor, PA-C  naproxen (NAPROSYN) 500 MG tablet Take 1 tablet (500 mg total) by mouth 2 (two) times daily with a meal. 02/20/17   Carrie Mew, MD  ondansetron (ZOFRAN ODT) 4 MG disintegrating tablet Take 1 tablet (4 mg total) by mouth every 8 (eight) hours as needed for nausea or vomiting. 02/20/17   Carrie Mew, MD     Allergies Patient has no known allergies.   No family history on file.  Social History Social History  Substance Use Topics  . Smoking status: Never Smoker  . Smokeless tobacco: Never Used  . Alcohol use No     Comment: occ    Review of Systems  Constitutional:   No fever or chills.  ENT:   No sore throat. No rhinorrhea. Cardiovascular:   No chest pain or syncope. Respiratory:   No dyspnea or cough. Gastrointestinal:   Positive for flank and abdominal pain as above. No vomiting or diarrhea.  Musculoskeletal:   Negative for focal pain or swelling All other systems reviewed and are negative except as documented above in ROS and HPI.  ____________________________________________   PHYSICAL EXAM:  VITAL SIGNS: ED Triage Vitals  Enc Vitals Group     BP 02/20/17 1426 125/85     Pulse Rate 02/20/17 1423 90     Resp 02/20/17 1423 16  Temp 02/20/17 1423 98.3 F (36.8 C)     Temp Source 02/20/17 1423 Oral     SpO2 02/20/17 1423 96 %     Weight 02/20/17 1424 247 lb (112 kg)     Height 02/20/17 1424 5\' 6"  (1.676 m)     Head Circumference --      Peak Flow --      Pain Score 02/20/17 1423 10     Pain Loc --      Pain Edu? --      Excl. in [place]? --     Vital signs reviewed, nursing assessments reviewed.   Constitutional:   Alert and oriented. Well appearing and in no distress. Eyes:   No scleral icterus. No conjunctival pallor. PERRL. EOMI.  No  nystagmus. ENT   Head:   Normocephalic and atraumatic.   Nose:   No congestion/rhinnorhea.    Mouth/Throat:   MMM, no pharyngeal erythema. No peritonsillar mass.    Neck:   No meningismus. Full ROM Hematological/Lymphatic/Immunilogical:   No cervical lymphadenopathy. Cardiovascular:   RRR. Symmetric bilateral radial and DP pulses.  No murmurs.  Respiratory:   Normal respiratory effort without tachypnea/retractions. Breath sounds are clear and equal bilaterally. No wheezes/rales/rhonchi. Gastrointestinal:   Soft With soft tissue and muscular tenderness along the right flank. Also tenderness along the right side of the abdomen.. Non distended. There is no CVA tenderness.  No rebound, rigidity, or guarding. Genitourinary:   deferred Musculoskeletal:   Normal range of motion in all extremities. No joint effusions.  No lower extremity tenderness.  No edema. Neurologic:   Normal speech and language.  CN 2-10 normal. Motor grossly intact. No gross focal neurologic deficits are appreciated.  Skin:    Skin is warm, dry and intact. No rash noted.  No petechiae, purpura, or bullae.  ____________________________________________    LABS (pertinent positives/negatives) (all labs ordered are listed, but only abnormal results are displayed) Labs Reviewed  COMPREHENSIVE METABOLIC PANEL - Abnormal; Notable for the following:       Result Value   Glucose, Bld 106 (*)    AST 61 (*)    ALT 99 (*)    All other components within normal limits  URINALYSIS, COMPLETE (UACMP) WITH MICROSCOPIC - Abnormal; Notable for the following:    Color, Urine YELLOW (*)    APPearance HAZY (*)    Hgb urine dipstick SMALL (*)    Leukocytes, UA MODERATE (*)    Bacteria, UA RARE (*)    Squamous Epithelial / LPF 6-30 (*)    All other components within normal limits  CBC  POCT PREGNANCY, URINE  POC URINE PREG, ED    ____________________________________________   EKG    ____________________________________________    RADIOLOGY  US Transvaginal Non-ob  Result Date: 02/20/2017 CLINICAL DATA:  Right lower quadrant and flank pain. History of ovarian cysts. Status post bilateral tubal ligation. EXAM: TRANSABDOMINAL AND TRANSVAGINAL ULTRASOUND OF PELVIS DOPPLER ULTRASOUND OF OVARIES TECHNIQUE: Both transabdominal and transvaginal ultrasound examinations of the pelvis were performed. Transabdominal technique was performed for global imaging of the pelvis including uterus, ovaries, adnexal regions, and pelvic cul-de-sac. It was necessary to proceed with endovaginal exam following the transabdominal exam to visualize the CT from 02/20/2017. Color and duplex Doppler ultrasound was utilized to evaluate blood flow to the ovaries. COMPARISON:  None. FINDINGS: Uterus Measurements: 9.6 x 4.7 x 5.3 cm. No fibroids or other mass visualized. Uterus is slightly anteverted. Endometrium Thickness: 7.3 mm.  No focal abnormality visualized.  Right ovary The right ovary was not visualized. No adnexal mass or adenopathy however is seen on the right. Left ovary Measurements: 3.4 x 2.2 x 2.5 cm. Normal appearance/no adnexal mass. Pulsed Doppler evaluation of the visualized left ovary demonstrates normal low-resistance arterial and venous waveforms. Other findings No abnormal free fluid. IMPRESSION: Nonvisualized right ovary. Unremarkable left ovary, uterus and endometrium. No evidence of torsion on the left. Electronically Signed   By: Ashley Royalty M.D.   On: 02/20/2017 18:22   US Pelvis Complete  Result Date: 02/20/2017 CLINICAL DATA:  Right lower quadrant and flank pain. History of ovarian cysts. Status post bilateral tubal ligation. EXAM: TRANSABDOMINAL AND TRANSVAGINAL ULTRASOUND OF PELVIS DOPPLER ULTRASOUND OF OVARIES TECHNIQUE: Both transabdominal and transvaginal ultrasound examinations of the pelvis were performed.  Transabdominal technique was performed for global imaging of the pelvis including uterus, ovaries, adnexal regions, and pelvic cul-de-sac. It was necessary to proceed with endovaginal exam following the transabdominal exam to visualize the CT from 02/20/2017. Color and duplex Doppler ultrasound was utilized to evaluate blood flow to the ovaries. COMPARISON:  None. FINDINGS: Uterus Measurements: 9.6 x 4.7 x 5.3 cm. No fibroids or other mass visualized. Uterus is slightly anteverted. Endometrium Thickness: 7.3 mm.  No focal abnormality visualized. Right ovary The right ovary was not visualized. No adnexal mass or adenopathy however is seen on the right. Left ovary Measurements: 3.4 x 2.2 x 2.5 cm. Normal appearance/no adnexal mass. Pulsed Doppler evaluation of the visualized left ovary demonstrates normal low-resistance arterial and venous waveforms. Other findings No abnormal free fluid. IMPRESSION: Nonvisualized right ovary. Unremarkable left ovary, uterus and endometrium. No evidence of torsion on the left. Electronically Signed   By: Ashley Royalty M.D.   On: 02/20/2017 18:22   [place] Art/ven Flow Abd Pelv Doppler  Result Date: 02/20/2017 CLINICAL DATA:  Right lower quadrant and flank pain. History of ovarian cysts. Status post bilateral tubal ligation. EXAM: TRANSABDOMINAL AND TRANSVAGINAL ULTRASOUND OF PELVIS DOPPLER ULTRASOUND OF OVARIES TECHNIQUE: Both transabdominal and transvaginal ultrasound examinations of the pelvis were performed. Transabdominal technique was performed for global imaging of the pelvis including uterus, ovaries, adnexal regions, and pelvic cul-de-sac. It was necessary to proceed with endovaginal exam following the transabdominal exam to visualize the CT from 02/20/2017. Color and duplex Doppler ultrasound was utilized to evaluate blood flow to the ovaries. COMPARISON:  None. FINDINGS: Uterus Measurements: 9.6 x 4.7 x 5.3 cm. No fibroids or other mass visualized. Uterus is slightly  anteverted. Endometrium Thickness: 7.3 mm.  No focal abnormality visualized. Right ovary The right ovary was not visualized. No adnexal mass or adenopathy however is seen on the right. Left ovary Measurements: 3.4 x 2.2 x 2.5 cm. Normal appearance/no adnexal mass. Pulsed Doppler evaluation of the visualized left ovary demonstrates normal low-resistance arterial and venous waveforms. Other findings No abnormal free fluid. IMPRESSION: Nonvisualized right ovary. Unremarkable left ovary, uterus and endometrium. No evidence of torsion on the left. Electronically Signed   By: Ashley Royalty M.D.   On: 02/20/2017 18:22   Ct Renal Stone Study  Result Date: 02/20/2017 CLINICAL DATA:  Low back pain. EXAM: CT ABDOMEN AND PELVIS WITHOUT CONTRAST TECHNIQUE: Multidetector CT imaging of the abdomen and pelvis was performed following the standard protocol without IV contrast. COMPARISON:  None FINDINGS: Lower chest: No acute abnormality. Hepatobiliary: There is diffuse hepatic steatosis. The gallbladder appears normal. No biliary dilatation. Pancreas: Unremarkable. No pancreatic ductal dilatation or surrounding inflammatory changes. Spleen: Normal in size without focal abnormality. Adrenals/Urinary  Tract: The adrenal glands appear normal. The kidneys are unremarkable. The urinary bladder is normal. Stomach/Bowel: The stomach is normal. The small bowel loops have a normal course and caliber. No bowel obstruction. Vascular/Lymphatic: No significant vascular findings are present. No enlarged abdominal or pelvic lymph nodes. Reproductive: Uterus and bilateral adnexa are unremarkable. Other: No abdominal wall hernia or abnormality. No abdominopelvic ascites. Musculoskeletal: No acute or significant osseous findings. IMPRESSION: 1. No acute findings within the abdomen or pelvis. No renal calculi identified or hydronephrosis. 2. Hepatic steatosis Electronically Signed   By: Kerby Moors M.D.   On: 02/20/2017 16:18     ____________________________________________   PROCEDURES Procedures  ____________________________________________   INITIAL IMPRESSION / ASSESSMENT AND PLAN / ED COURSE  Pertinent labs & imaging results that were available during my care of the patient were reviewed by me and considered in my medical decision making (see chart for details).  Patient presents with right flank pain. Differential includes renal colic versus appendicitis versus ovarian torsion. Low suspicion for STI PID or TOA. Low suspicion for bowel perforation or obstruction. Abdomen is not acute. Vital signs are normal. I give the patient Dilaudid and Zofran for symptom control, get a CT renal stone study. Negative, would proceed with ultrasound to evaluate for torsion. Clinical Course as of Feb 21 1907  Wed Feb 20, 2017  1647 CT neg. Will proceed with [place].  [PS]  1823 Pt still having some pain. Imaging negative, urinalysis consistent with urinary tract infection. Review of electronic medical records shows 7 or 8 opiate prescriptions in the last 18 months. While this does not certainly indicate opioid seeking behavior, I think it is suggestive of a low pain tolerance. Reviewing the complaints that she had that resulted in opiate prescriptions shows that usually gets for minor musculoskeletal issues or for urinary tract infection with similar symptoms in the past. Given this, her pain does not seem out of the ordinary for her, and I have low suspicion of torsion at this point with a negative [place]. I'll treat with Keflex and have her follow up with primary care.  [PS]  1906 Ultrasound report reviewed. Right ovary nonvisualized. Since symptoms are on the right, and there is no identifiable ovary, highly unlikely symptoms are related to torsion  [PS]    Clinical Course User Index [PS] Carrie Mew, MD     ----------------------------------------- 7:09 PM on  02/20/2017 -----------------------------------------  Considering the patient's symptoms, medical history, and physical examination today, I have low suspicion for cholecystitis or biliary pathology, pancreatitis, perforation or bowel obstruction, hernia, intra-abdominal abscess, AAA or dissection, volvulus or intussusception, mesenteric ischemia, or appendicitis.     ____________________________________________   FINAL CLINICAL IMPRESSION(S) / ED DIAGNOSES  Final diagnoses:  Right flank pain  Cystitis      New Prescriptions   CEPHALEXIN (KEFLEX) 500 MG CAPSULE    Take 1 capsule (500 mg total) by mouth 2 (two) times daily.   NAPROXEN (NAPROSYN) 500 MG TABLET    Take 1 tablet (500 mg total) by mouth 2 (two) times daily with a meal.   ONDANSETRON (ZOFRAN ODT) 4 MG DISINTEGRATING TABLET    Take 1 tablet (4 mg total) by mouth every 8 (eight) hours as needed for nausea or vomiting.     Portions of this note were generated with dragon dictation software. Dictation errors may occur despite best attempts at proofreading.    Carrie Mew, MD 02/20/17 Pauline Aus

## 2017-02-20 NOTE — ED Triage Notes (Signed)
C/O right lower back and flank pain. Radiating to right groin.  Pain started at about 0930 this morning.   Denies dysuria.  Pain worse with movement/ coughing.

## 2017-04-09 ENCOUNTER — Emergency Department (HOSPITAL_COMMUNITY): Payer: Medicaid Other

## 2017-04-09 ENCOUNTER — Emergency Department (HOSPITAL_COMMUNITY)
Admission: EM | Admit: 2017-04-09 | Discharge: 2017-04-09 | Disposition: A | Discharge status: Home / Self Care | Payer: Medicaid Other | Attending: Emergency Medicine | Admitting: Emergency Medicine

## 2017-04-09 ENCOUNTER — Encounter (HOSPITAL_COMMUNITY): Payer: Self-pay | Admitting: Emergency Medicine

## 2017-04-09 DIAGNOSIS — R1031 Right lower quadrant pain: Secondary | ICD-10-CM | POA: Diagnosis not present

## 2017-04-09 DIAGNOSIS — R339 Retention of urine, unspecified: Secondary | ICD-10-CM | POA: Diagnosis present

## 2017-04-09 DIAGNOSIS — N3000 Acute cystitis without hematuria: Secondary | ICD-10-CM | POA: Insufficient documentation

## 2017-04-09 LAB — URINALYSIS, ROUTINE W REFLEX MICROSCOPIC
BILIRUBIN URINE: NEGATIVE
Glucose, UA: NEGATIVE mg/dL
KETONES UR: NEGATIVE mg/dL
NITRITE: NEGATIVE
PH: 6 (ref 5.0–8.0)
Protein, ur: NEGATIVE mg/dL
Specific Gravity, Urine: 1.01 (ref 1.005–1.030)

## 2017-04-09 LAB — COMPREHENSIVE METABOLIC PANEL
ALT: 90 U/L — AB (ref 14–54)
AST: 61 U/L — ABNORMAL HIGH (ref 15–41)
Albumin: 3.6 g/dL (ref 3.5–5.0)
Alkaline Phosphatase: 104 U/L (ref 38–126)
Anion gap: 7 (ref 5–15)
BILIRUBIN TOTAL: 0.5 mg/dL (ref 0.3–1.2)
BUN: 11 mg/dL (ref 6–20)
CALCIUM: 9.2 mg/dL (ref 8.9–10.3)
CHLORIDE: 106 mmol/L (ref 101–111)
CO2: 23 mmol/L (ref 22–32)
CREATININE: 0.8 mg/dL (ref 0.44–1.00)
Glucose, Bld: 113 mg/dL — ABNORMAL HIGH (ref 65–99)
Potassium: 3.8 mmol/L (ref 3.5–5.1)
Sodium: 136 mmol/L (ref 135–145)
TOTAL PROTEIN: 7.1 g/dL (ref 6.5–8.1)

## 2017-04-09 LAB — CBC
HCT: 39.6 % (ref 36.0–46.0)
Hemoglobin: 13.4 g/dL (ref 12.0–15.0)
MCH: 30.5 pg (ref 26.0–34.0)
MCHC: 33.8 g/dL (ref 30.0–36.0)
MCV: 90 fL (ref 78.0–100.0)
PLATELETS: 188 10*3/uL (ref 150–400)
RBC: 4.4 MIL/uL (ref 3.87–5.11)
RDW: 12.4 % (ref 11.5–15.5)
WBC: 10.6 10*3/uL — AB (ref 4.0–10.5)

## 2017-04-09 LAB — GC/CHLAMYDIA PROBE AMP (~~LOC~~) NOT AT ARMC
CHLAMYDIA, DNA PROBE: NEGATIVE
Neisseria Gonorrhea: NEGATIVE

## 2017-04-09 LAB — HCG, QUANTITATIVE, PREGNANCY: hCG, Beta Chain, Quant, S: <1 m[IU]/mL (ref <5)

## 2017-04-09 LAB — WET PREP, GENITAL
Sperm: NONE SEEN
Trich, Wet Prep: NONE SEEN
Yeast Wet Prep HPF POC: NONE SEEN

## 2017-04-09 LAB — LIPASE, BLOOD: LIPASE: 32 U/L (ref 11–51)

## 2017-04-09 MED ORDER — ONDANSETRON HCL 4 MG/2ML IJ SOLN
4.0000 mg | Freq: Once | INTRAMUSCULAR | Status: AC
  Administered 2017-04-09: 4 mg via INTRAVENOUS
  Filled 2017-04-09: qty 2

## 2017-04-09 MED ORDER — ONDANSETRON 4 MG PO TBDP
ORAL_TABLET | ORAL | Status: AC
  Filled 2017-04-09: qty 1

## 2017-04-09 MED ORDER — DEXTROSE 5 % IV SOLN
1.0000 g | Freq: Once | INTRAVENOUS | Status: AC
  Administered 2017-04-09: 1 g via INTRAVENOUS
  Filled 2017-04-09: qty 10

## 2017-04-09 MED ORDER — HYDROMORPHONE HCL 1 MG/ML IJ SOLN
0.5000 mg | Freq: Once | INTRAMUSCULAR | Status: AC
  Administered 2017-04-09: 0.5 mg via INTRAVENOUS
  Filled 2017-04-09: qty 0.5

## 2017-04-09 MED ORDER — PHENAZOPYRIDINE HCL 200 MG PO TABS: 200.0000 mg | ORAL_TABLET | Freq: Three times a day (TID) | ORAL | 0 refills | Status: DC

## 2017-04-09 MED ORDER — PHENAZOPYRIDINE HCL 100 MG PO TABS
200.0000 mg | ORAL_TABLET | Freq: Once | ORAL | Status: AC
  Administered 2017-04-09: 200 mg via ORAL
  Filled 2017-04-09: qty 2

## 2017-04-09 MED ORDER — CEPHALEXIN 500 MG PO CAPS: 500.0000 mg | ORAL_CAPSULE | Freq: Four times a day (QID) | ORAL | 0 refills | Status: DC

## 2017-04-09 MED ORDER — DIPHENHYDRAMINE HCL 50 MG/ML IJ SOLN
25.0000 mg | Freq: Once | INTRAMUSCULAR | Status: AC
  Administered 2017-04-09: 25 mg via INTRAVENOUS

## 2017-04-09 MED ORDER — MORPHINE SULFATE (PF) 4 MG/ML IV SOLN
4.0000 mg | Freq: Once | INTRAVENOUS | Status: AC
  Administered 2017-04-09: 4 mg via INTRAVENOUS
  Filled 2017-04-09: qty 1

## 2017-04-09 MED ORDER — DIPHENHYDRAMINE HCL 50 MG/ML IJ SOLN
INTRAMUSCULAR | Status: AC
  Filled 2017-04-09: qty 1

## 2017-04-09 MED ORDER — ONDANSETRON 4 MG PO TBDP
4.0000 mg | ORAL_TABLET | Freq: Once | ORAL | Status: AC | PRN
  Administered 2017-04-09: 4 mg via ORAL

## 2017-04-09 MED ORDER — IOPAMIDOL (ISOVUE-300) INJECTION 61%
INTRAVENOUS | Status: AC
  Administered 2017-04-09: 100 mL via INTRAVENOUS
  Filled 2017-04-09: qty 100

## 2017-04-09 NOTE — Discharge Instructions (Signed)
Please follow with your primary care doctor in the next 2 days for a check-up. They must obtain records for further management.  ° °Do not hesitate to return to the Emergency Department for any new, worsening or concerning symptoms.  ° °

## 2017-04-09 NOTE — ED Notes (Signed)
ED Provider at bedside. 

## 2017-04-09 NOTE — ED Provider Notes (Signed)
[name] DEPT Provider Note   CSN: [id number] Arrival date & time: 04/09/17  0008  By signing my name below, I, Reola Mosher, attest that this documentation has been prepared under the direction and in the presence of [employer], PA-C.  Electronically Signed: Reola Mosher, ED Scribe. 04/09/17. 3:14 AM.  History   Chief Complaint Chief Complaint  Patient presents with  . Urinary Retention  . Abdominal Pain   The history is provided by the patient. No language interpreter was used.    HPI Comments: Kristi Gutierrez is a 36 y.o. female who presents to the Emergency Department complaining of persistent, worsening suprapubic abdominal pain beginning several days ago. Pt notes radiation of her pain into her right flank. She notes associated urinary retention. She reports that when she attempts to pass urine that her abdominal pain will acutely worsen and she states that she will only be able to pass only several drops each time. She reports associated nausea and vomiting secondary to the onset of her pain. No h/o renal calculi. She denies fever, diarrhea, weakness, shortness of breath, rash, or any other associated symptoms.   Past Medical History:  Diagnosis Date  . Migraine    currently active, uses Excedrin  . Pseudotumor cerebri    Patient Active Problem List   Diagnosis Date Noted  . Admission for sterilization 03/01/2015   Past Surgical History:  Procedure Laterality Date  . ATTEMPTED BTL-HAD TO ABORT PROCEDURE  10-2014  . CYST EXCISION    . GANGLION CYST EXCISION Left 12/26/2015   Procedure: REMOVAL GANGLION OF WRIST;  Surgeon: Earnestine Leys, MD;  Location: [facility] ORS;  Service: Orthopedics;  Laterality: Left;  . GANGLION CYST EXCISION Left 04/20/2016   Procedure: REMOVAL GANGLION OF WRIST;  Surgeon: Leanor Kail, MD;  Location: [facility] ORS;  Service: Orthopedics;  Laterality: Left;  . LAPAROSCOPIC TUBAL LIGATION Bilateral 03/01/2015   Procedure: LAPAROSCOPIC  TUBAL LIGATION;  Surgeon: Will Bonnet, MD;  Location: [facility] ORS;  Service: Gynecology;  Laterality: Bilateral;  . MOUTH SURGERY    . TUBAL LIGATION     OB History    Gravida Para Term Preterm AB Living   3         3   SAB TAB Ectopic Multiple Live Births                 Home Medications    Prior to Admission medications   Medication Sig Start Date End Date Taking? Authorizing Provider  cephALEXin (KEFLEX) 500 MG capsule Take 1 capsule (500 mg total) by mouth 4 (four) times daily. 04/09/17   Tytianna Greenley, Elmyra Ricks, PA-C  HYDROcodone-acetaminophen (NORCO/VICODIN) 5-325 MG tablet Take 1 tablet by mouth every 6 (six) hours as needed for moderate pain. Patient not taking: Reported on 04/09/2017 10/22/16   Laban Emperor, PA-C  naproxen (NAPROSYN) 500 MG tablet Take 1 tablet (500 mg total) by mouth 2 (two) times daily with a meal. Patient not taking: Reported on 04/09/2017 02/20/17   Carrie Mew, MD  ondansetron (ZOFRAN ODT) 4 MG disintegrating tablet Take 1 tablet (4 mg total) by mouth every 8 (eight) hours as needed for nausea or vomiting. Patient not taking: Reported on 04/09/2017 02/20/17   Carrie Mew, MD  phenazopyridine (PYRIDIUM) 200 MG tablet Take 1 tablet (200 mg total) by mouth 3 (three) times daily. 04/09/17   Buck Mcaffee, Charna Elizabeth   Family History No family history on file.  Social History Social History  Substance Use Topics  . Smoking  status: Never Smoker  . Smokeless tobacco: Never Used  . Alcohol use No     Comment: occ   Allergies   Patient has no known allergies.  Review of Systems Review of Systems  A complete review of systems was obtained and all systems are negative except as noted in the HPI and PMH.   Physical Exam Updated Vital Signs BP (!) 108/54   Pulse 74   Temp 97.7 F (36.5 C) (Oral)   Resp 20   SpO2 98%   Physical Exam  Constitutional: She is oriented to person, place, and time. She appears well-developed and well-nourished. No  distress.  Tearful, mildly agitated   HENT:  Head: Normocephalic and atraumatic.  Mouth/Throat: Oropharynx is clear and moist.  Eyes: Pupils are equal, round, and reactive to light. Conjunctivae and EOM are normal.  Neck: Normal range of motion.  Cardiovascular: Normal rate, regular rhythm and intact distal pulses.   Pulmonary/Chest: Effort normal and breath sounds normal.  Abdominal: Soft. There is no tenderness.  Tender to palpation in the suprapubic region, no guarding or rebound. Rovsing and psoas negative.  Genitourinary:  Genitourinary Comments: No CVA tenderness to percussion  Pelvic exam a chaperoned by nurse: No rashes or  lesions. No cervical motion or adnexal tenderness.  Musculoskeletal: Normal range of motion.  Neurological: She is alert and oriented to person, place, and time.  Skin: She is not diaphoretic.  Psychiatric: She has a normal mood and affect.  Nursing note and vitals reviewed.  ED Treatments / Results  DIAGNOSTIC STUDIES: Oxygen Saturation is 99% on RA, normal by my interpretation.   COORDINATION OF CARE: 3:14 AM-Discussed next steps with pt. Pt verbalized understanding and is agreeable with the plan.   Labs (all labs ordered are listed, but only abnormal results are displayed) Labs Reviewed  WET PREP, GENITAL - Abnormal; Notable for the following:       Result Value   Clue Cells Wet Prep HPF POC PRESENT (*)    WBC, Wet Prep HPF POC MANY (*)    All other components within normal limits  URINALYSIS, ROUTINE W REFLEX MICROSCOPIC - Abnormal; Notable for the following:    APPearance HAZY (*)    Hgb urine dipstick SMALL (*)    Leukocytes, UA LARGE (*)    Bacteria, UA RARE (*)    Squamous Epithelial / LPF 0-5 (*)    All other components within normal limits  COMPREHENSIVE METABOLIC PANEL - Abnormal; Notable for the following:    Glucose, Bld 113 (*)    AST 61 (*)    ALT 90 (*)    All other components within normal limits  CBC - Abnormal; Notable  for the following:    WBC 10.6 (*)    All other components within normal limits  URINE CULTURE  HCG, QUANTITATIVE, PREGNANCY  LIPASE, BLOOD  GC/CHLAMYDIA PROBE AMP ([place]) NOT AT [facility]   EKG  EKG Interpretation None      Radiology US Transvaginal Non-ob  Result Date: 04/09/2017 CLINICAL DATA:  Lower abdominal/pelvic pain EXAM: TRANSABDOMINAL AND TRANSVAGINAL ULTRASOUND OF PELVIS DOPPLER ULTRASOUND OF OVARIES TECHNIQUE: Study was performed transabdominally to optimize pelvic field of view evaluation and transvaginally to optimize internal visceral architecture evaluation. Color and duplex Doppler ultrasound was utilized to evaluate blood flow to the ovaries. COMPARISON:  CT abdomen and pelvis April 09, 2017 FINDINGS: Uterus Measurements: 10.9 x 4.7 x 5.6 cm. No fibroids or other mass visualized. Endometrium Thickness: 17 mm.  No  focal abnormality visualized. Right ovary Measurements: 3.8 x 2.2 x 3.0 cm. There is an apparent dominant follicle in the right ovary measuring 2.2 x 1.8 x 1.8 cm Left ovary Measurements: 3.1 x 1.8 x 2.0 cm. Normal appearance/no adnexal mass. Pulsed Doppler evaluation of both ovaries demonstrates normal low-resistance arterial and venous waveforms. Other findings No abnormal free fluid. There is apparent debris within the urinary bladder. Urinary bladder wall appears thickened. IMPRESSION: 1.  Findings indicative of a degree of cystitis. 2.  No ovarian torsion on either side. 3.  No extrauterine mass beyond physiologic follicle right ovary. 4. Endometrium upper normal in thickness with smooth contour. No intrauterine lesion evident. Electronically Signed   By: Lowella Grip III M.D.   On: 04/09/2017 08:08   US Pelvis Complete  Result Date: 04/09/2017 CLINICAL DATA:  Lower abdominal/pelvic pain EXAM: TRANSABDOMINAL AND TRANSVAGINAL ULTRASOUND OF PELVIS DOPPLER ULTRASOUND OF OVARIES TECHNIQUE: Study was performed transabdominally to optimize pelvic field of view  evaluation and transvaginally to optimize internal visceral architecture evaluation. Color and duplex Doppler ultrasound was utilized to evaluate blood flow to the ovaries. COMPARISON:  CT abdomen and pelvis April 09, 2017 FINDINGS: Uterus Measurements: 10.9 x 4.7 x 5.6 cm. No fibroids or other mass visualized. Endometrium Thickness: 17 mm.  No focal abnormality visualized. Right ovary Measurements: 3.8 x 2.2 x 3.0 cm. There is an apparent dominant follicle in the right ovary measuring 2.2 x 1.8 x 1.8 cm Left ovary Measurements: 3.1 x 1.8 x 2.0 cm. Normal appearance/no adnexal mass. Pulsed Doppler evaluation of both ovaries demonstrates normal low-resistance arterial and venous waveforms. Other findings No abnormal free fluid. There is apparent debris within the urinary bladder. Urinary bladder wall appears thickened. IMPRESSION: 1.  Findings indicative of a degree of cystitis. 2.  No ovarian torsion on either side. 3.  No extrauterine mass beyond physiologic follicle right ovary. 4. Endometrium upper normal in thickness with smooth contour. No intrauterine lesion evident. Electronically Signed   By: Lowella Grip III M.D.   On: 04/09/2017 08:08   Ct Abdomen Pelvis W Contrast  Addendum Date: 04/09/2017   ADDENDUM REPORT: 04/09/2017 08:09 ADDENDUM: Add to IMPRESSION: Urinary bladder wall thickening consistent with a degree of cystitis. Electronically Signed   By: Lowella Grip III M.D.   On: 04/09/2017 08:09   Result Date: 04/09/2017 CLINICAL DATA:  Abdominal pain with nausea and vomiting. Urinary frequency with EXAM: CT ABDOMEN AND PELVIS WITH CONTRAST TECHNIQUE: Multidetector CT imaging of the abdomen and pelvis was performed using the standard protocol following bolus administration of intravenous contrast. CONTRAST:  100 mL  ISOVUE-300 IOPAMIDOL (ISOVUE-300) INJECTION 61% COMPARISON:  Feb 20, 2017 FINDINGS: Lower chest: Lung bases are clear. Hepatobiliary: There is hepatic steatosis. No focal liver  lesions are evident. Gallbladder wall is not appreciably thickened. There is no biliary duct dilatation. Pancreas: There is no pancreatic mass or inflammatory focus. Spleen: No splenic lesions are evident. Adrenals/Urinary Tract: Adrenals appear unremarkable bilaterally. Kidneys bilaterally show no evidence of mass or hydronephrosis on either side. There is no renal or ureteral calculus on either side. Urinary bladder is midline. There is evidence of a degree of wall thickening in the urinary bladder. Stomach/Bowel: There is no appreciable bowel wall or mesenteric thickening. No bowel obstruction. No free air or portal venous air. Vascular/Lymphatic: There is no abdominal aortic aneurysm. No vascular lesions are evident. There is no appreciable adenopathy in the abdomen or pelvis. Reproductive: Uterus is anteverted. Tubal ligation clips are noted bilaterally.  There is no pelvic mass. Other: There is no periappendiceal region inflammation. No abscess or ascites is evident in the abdomen or pelvis. There is a minimal ventral hernia containing only fat. There is periumbilical scarring in the abdominal wall region. Musculoskeletal: There are no blastic or lytic bone lesions. No intramuscular lesions are evident. IMPRESSION: 1. No bowel wall thickening or bowel obstruction. No abscess. No periappendiceal region inflammation. 2.  No renal or ureteral calculi.  No hydronephrosis. 3. Scarring in the anterior abdominal wall at the periumbilical level. Minimal ventral hernia containing only fat. Tubal ligation clips noted. 4.  Hepatic steatosis without focal liver lesion evident. Electronically Signed: By: Lowella Grip III M.D. On: 04/09/2017 07:23   [place] Art/ven Flow Abd Pelv Doppler  Result Date: 04/09/2017 CLINICAL DATA:  Lower abdominal/pelvic pain EXAM: TRANSABDOMINAL AND TRANSVAGINAL ULTRASOUND OF PELVIS DOPPLER ULTRASOUND OF OVARIES TECHNIQUE: Study was performed transabdominally to optimize pelvic field of view  evaluation and transvaginally to optimize internal visceral architecture evaluation. Color and duplex Doppler ultrasound was utilized to evaluate blood flow to the ovaries. COMPARISON:  CT abdomen and pelvis April 09, 2017 FINDINGS: Uterus Measurements: 10.9 x 4.7 x 5.6 cm. No fibroids or other mass visualized. Endometrium Thickness: 17 mm.  No focal abnormality visualized. Right ovary Measurements: 3.8 x 2.2 x 3.0 cm. There is an apparent dominant follicle in the right ovary measuring 2.2 x 1.8 x 1.8 cm Left ovary Measurements: 3.1 x 1.8 x 2.0 cm. Normal appearance/no adnexal mass. Pulsed Doppler evaluation of both ovaries demonstrates normal low-resistance arterial and venous waveforms. Other findings No abnormal free fluid. There is apparent debris within the urinary bladder. Urinary bladder wall appears thickened. IMPRESSION: 1.  Findings indicative of a degree of cystitis. 2.  No ovarian torsion on either side. 3.  No extrauterine mass beyond physiologic follicle right ovary. 4. Endometrium upper normal in thickness with smooth contour. No intrauterine lesion evident. Electronically Signed   By: Lowella Grip III M.D.   On: 04/09/2017 08:08    Procedures Procedures   Medications Ordered in ED Medications  ondansetron (ZOFRAN-ODT) 4 MG disintegrating tablet (not administered)  cefTRIAXone (ROCEPHIN) 1 g in dextrose 5 % 50 mL IVPB (1 g Intravenous New Bag/Given 04/09/17 0809)  ondansetron (ZOFRAN-ODT) disintegrating tablet 4 mg (4 mg Oral Given 04/09/17 0024)  morphine 4 MG/ML injection 4 mg (4 mg Intravenous Given 04/09/17 0354)  ondansetron (ZOFRAN) injection 4 mg (4 mg Intravenous Given 04/09/17 0354)  HYDROmorphone (DILAUDID) injection 0.5 mg (0.5 mg Intravenous Given 04/09/17 0428)  diphenhydrAMINE (BENADRYL) injection 25 mg (25 mg Intravenous Given 04/09/17 0435)  HYDROmorphone (DILAUDID) injection 0.5 mg (0.5 mg Intravenous Given 04/09/17 0538)  iopamidol (ISOVUE-300) 61 % injection (100 mLs  Intravenous Contrast Given 04/09/17 0652)  phenazopyridine (PYRIDIUM) tablet 200 mg (200 mg Oral Given 04/09/17 2694)   Initial Impression / Assessment and Plan / ED Course  I have reviewed the triage vital signs and the nursing notes.  Pertinent labs & imaging results that were available during my care of the patient were reviewed by me and considered in my medical decision making (see chart for details).     Vitals:   04/09/17 0500 04/09/17 0537 04/09/17 0546 04/09/17 0810  BP:  110/64 (!) 104/57 (!) 108/54  Pulse: 72  78 74  Resp:   20   Temp:      TempSrc:      SpO2: 96%  98% 98%    Medications  ondansetron (ZOFRAN-ODT) 4 MG  disintegrating tablet (not administered)  cefTRIAXone (ROCEPHIN) 1 g in dextrose 5 % 50 mL IVPB (1 g Intravenous New Bag/Given 04/09/17 0809)  ondansetron (ZOFRAN-ODT) disintegrating tablet 4 mg (4 mg Oral Given 04/09/17 0024)  morphine 4 MG/ML injection 4 mg (4 mg Intravenous Given 04/09/17 0354)  ondansetron (ZOFRAN) injection 4 mg (4 mg Intravenous Given 04/09/17 0354)  HYDROmorphone (DILAUDID) injection 0.5 mg (0.5 mg Intravenous Given 04/09/17 0428)  diphenhydrAMINE (BENADRYL) injection 25 mg (25 mg Intravenous Given 04/09/17 0435)  HYDROmorphone (DILAUDID) injection 0.5 mg (0.5 mg Intravenous Given 04/09/17 0538)  iopamidol (ISOVUE-300) 61 % injection (100 mLs Intravenous Contrast Given 04/09/17 0652)  phenazopyridine (PYRIDIUM) tablet 200 mg (200 mg Oral Given 04/09/17 9794)    Kristi Gutierrez is 36 y.o. female presenting with Urinary retention and suprapubic pain. No systemic signs of infection. After Foley is placed 300 mL are obtained and patient is still in extreme pain and states that she feels like her bladder is full. Pelvic exam unremarkable. Urinalysis consistent with infection, no CVA tenderness to suggest pyelonephritis, given the severity of her pain I doubt this is the cause of her discomfort. Patient is given Pyridium, Rocephin, urine culture  pending. Will also obtain percent rule out torsion and CT abdomen pelvis to evaluate for a appendicitis.  Pelvic ultrasound with venous duplex negative, CT abdomen pelvis negative, surprisingly this patient's pain can only be attributed to a cystitis. Patient will be discharged home with Keflex and Pyridium.  Evaluation does not show pathology that would require ongoing emergent intervention or inpatient treatment. Pt is hemodynamically stable and mentating appropriately. Discussed findings and plan with patient/guardian, who agrees with care plan. All questions answered. Return precautions discussed and outpatient follow up given.      Final Clinical Impressions(s) / ED Diagnoses   Final diagnoses:  RLQ abdominal pain  Acute cystitis without hematuria   New Prescriptions New Prescriptions   CEPHALEXIN (KEFLEX) 500 MG CAPSULE    Take 1 capsule (500 mg total) by mouth 4 (four) times daily.   PHENAZOPYRIDINE (PYRIDIUM) 200 MG TABLET    Take 1 tablet (200 mg total) by mouth 3 (three) times daily.   I personally performed the services described in this documentation, which was scribed in my presence. The recorded information has been reviewed and is accurate.     Waynetta Pean 04/09/17 0840    Ward, Delice Bison, DO 04/09/17 2310

## 2017-04-09 NOTE — ED Triage Notes (Signed)
Pt reports that she while she feels as if her bladder is full she is unable to urinate.  She also reports RLQ pain that radiates to her back.  Pt is very nauseated but denies v/d/weakness, sob and fever

## 2017-04-09 NOTE — ED Notes (Signed)
Bladder scan unavailable. Unable to measure post void residual. EDP aware.

## 2017-04-09 NOTE — ED Notes (Signed)
Patient transported to CT 

## 2017-04-11 LAB — URINE CULTURE: Culture: >=100000 — AB

## 2017-04-12 ENCOUNTER — Telehealth: Payer: Self-pay

## 2017-04-12 NOTE — Telephone Encounter (Signed)
Post ED Visit - Positive Culture Follow-up  Culture report reviewed by antimicrobial stewardship pharmacist:  []  Elenor Quinones, Pharm.D. []  Heide Guile, Pharm.D., BCPS AQ-ID []  Parks Neptune, Pharm.D., BCPS []  Alycia Rossetti, Pharm.D., BCPS []  [place], [place].D., BCPS, AAHIVP [x]  Legrand Como, Pharm.D., BCPS, AAHIVP []  Salome Arnt, PharmD, BCPS []  Dimitri Ped, PharmD, BCPS []  Vincenza Hews, PharmD, BCPS  Positive urine culture Treated with Cephalexin, organism sensitive to the same and no further patient follow-up is required at this time.  Genia Del 04/12/2017, 12:53 PM

## 2017-05-21 ENCOUNTER — Emergency Department
Admission: EM | Admit: 2017-05-21 | Discharge: 2017-05-21 | Disposition: A | Discharge status: Home / Self Care | Payer: Medicaid Other | Attending: Emergency Medicine | Admitting: Emergency Medicine

## 2017-05-21 ENCOUNTER — Encounter: Payer: Self-pay | Admitting: Emergency Medicine

## 2017-05-21 DIAGNOSIS — M5442 Lumbago with sciatica, left side: Secondary | ICD-10-CM | POA: Diagnosis not present

## 2017-05-21 DIAGNOSIS — Z79899 Other long term (current) drug therapy: Secondary | ICD-10-CM | POA: Diagnosis not present

## 2017-05-21 DIAGNOSIS — M5441 Lumbago with sciatica, right side: Secondary | ICD-10-CM | POA: Insufficient documentation

## 2017-05-21 DIAGNOSIS — M545 Low back pain: Secondary | ICD-10-CM | POA: Diagnosis present

## 2017-05-21 MED ORDER — ORPHENADRINE CITRATE 30 MG/ML IJ SOLN
60.0000 mg | Freq: Two times a day (BID) | INTRAMUSCULAR | Status: DC
  Administered 2017-05-21: 60 mg via INTRAMUSCULAR
  Filled 2017-05-21: qty 2

## 2017-05-21 MED ORDER — KETOROLAC TROMETHAMINE 30 MG/ML IJ SOLN
30.0000 mg | Freq: Once | INTRAMUSCULAR | Status: AC
  Administered 2017-05-21: 30 mg via INTRAMUSCULAR
  Filled 2017-05-21: qty 1

## 2017-05-21 MED ORDER — CYCLOBENZAPRINE HCL 10 MG PO TABS: 10.0000 mg | ORAL_TABLET | Freq: Three times a day (TID) | ORAL | 0 refills | Status: AC | PRN

## 2017-05-21 MED ORDER — MELOXICAM 15 MG PO TABS: 15.0000 mg | ORAL_TABLET | Freq: Every day | ORAL | 1 refills | Status: AC

## 2017-05-21 MED ORDER — OXYCODONE-ACETAMINOPHEN 5-325 MG PO TABS
1.0000 | ORAL_TABLET | Freq: Once | ORAL | Status: AC
  Administered 2017-05-21: 1 via ORAL
  Filled 2017-05-21: qty 1

## 2017-05-21 NOTE — ED Provider Notes (Signed)
[facility] Emergency Department Provider Note  ____________________________________________  Time seen: Approximately 9:12 PM  I have reviewed the triage vital signs and the nursing notes.   HISTORY  Chief Complaint Back Pain    HPI Kristi Gutierrez is a 36 y.o. female presents to the emergency department with 10 out of 10 low back pain with bilateral lower extremity radiculopathy after patient lifted a large patient today at work. Patient states that she has had similar symptoms in the past. She denies falls or other mechanisms of trauma. She denies weakness, changes in sensation of the lower extremities. Patient has been ambulating. No bowel or bladder incontinence. No alleviating measures have been attempted.   Past Medical History:  Diagnosis Date  . Migraine    currently active, uses Excedrin  . Pseudotumor cerebri     Patient Active Problem List   Diagnosis Date Noted  . Admission for sterilization 03/01/2015    Past Surgical History:  Procedure Laterality Date  . ATTEMPTED BTL-HAD TO ABORT PROCEDURE  10-2014  . CYST EXCISION    . GANGLION CYST EXCISION Left 12/26/2015   Procedure: REMOVAL GANGLION OF WRIST;  Surgeon: Earnestine Leys, MD;  Location: [facility] ORS;  Service: Orthopedics;  Laterality: Left;  . GANGLION CYST EXCISION Left 04/20/2016   Procedure: REMOVAL GANGLION OF WRIST;  Surgeon: Leanor Kail, MD;  Location: [facility] ORS;  Service: Orthopedics;  Laterality: Left;  . LAPAROSCOPIC TUBAL LIGATION Bilateral 03/01/2015   Procedure: LAPAROSCOPIC TUBAL LIGATION;  Surgeon: Will Bonnet, MD;  Location: [facility] ORS;  Service: Gynecology;  Laterality: Bilateral;  . MOUTH SURGERY    . TUBAL LIGATION      Prior to Admission medications   Medication Sig Start Date End Date Taking? Authorizing Provider  cephALEXin (KEFLEX) 500 MG capsule Take 1 capsule (500 mg total) by mouth 4 (four) times daily. 04/09/17   Pisciotta, Elmyra Ricks, PA-C  cyclobenzaprine  (FLEXERIL) 10 MG tablet Take 1 tablet (10 mg total) by mouth 3 (three) times daily as needed for muscle spasms. 05/21/17 05/26/17  Lannie Fields, PA-C  HYDROcodone-acetaminophen (NORCO/VICODIN) 5-325 MG tablet Take 1 tablet by mouth every 6 (six) hours as needed for moderate pain. Patient not taking: Reported on 04/09/2017 10/22/16   Laban Emperor, PA-C  meloxicam (MOBIC) 15 MG tablet Take 1 tablet (15 mg total) by mouth daily. 05/21/17 05/28/17  Lannie Fields, PA-C  naproxen (NAPROSYN) 500 MG tablet Take 1 tablet (500 mg total) by mouth 2 (two) times daily with a meal. Patient not taking: Reported on 04/09/2017 02/20/17   Carrie Mew, MD  ondansetron (ZOFRAN ODT) 4 MG disintegrating tablet Take 1 tablet (4 mg total) by mouth every 8 (eight) hours as needed for nausea or vomiting. Patient not taking: Reported on 04/09/2017 02/20/17   Carrie Mew, MD  phenazopyridine (PYRIDIUM) 200 MG tablet Take 1 tablet (200 mg total) by mouth 3 (three) times daily. 04/09/17   Pisciotta, Elmyra Ricks, PA-C    Allergies Patient has no known allergies.  No family history on file.  Social History Social History  Substance Use Topics  . Smoking status: Never Smoker  . Smokeless tobacco: Never Used  . Alcohol use No     Comment: occ     Review of Systems  Constitutional: No fever/chills Eyes: No visual changes. No discharge ENT: No upper respiratory complaints. Cardiovascular: no chest pain. Respiratory: no cough. No SOB. Musculoskeletal: Patient has low back pain with bilateral lower extremity radiculopathy. Skin: Negative for rash, abrasions, lacerations,  ecchymosis. Neurological: Negative for headaches, focal weakness or numbness.   ____________________________________________   PHYSICAL EXAM:  VITAL SIGNS: ED Triage Vitals [05/21/17 1933]  Enc Vitals Group     BP 126/73     Pulse Rate 95     Resp 20     Temp 97.8 F (36.6 C)     Temp Source Oral     SpO2 98 %     Weight 270 lb (122.5  kg)     Height 5\' 6"  (1.676 m)     Head Circumference      Peak Flow      Pain Score 10     Pain Loc      Pain Edu?      Excl. in [place]?      Constitutional: Alert and oriented. Well appearing and in no acute distress. Eyes: Conjunctivae are normal. PERRL. EOMI. Head: Atraumatic.  Cardiovascular: Normal rate, regular rhythm. Normal S1 and S2.  Good peripheral circulation. Respiratory: Normal respiratory effort without tachypnea or retractions. Lungs CTAB. Good air entry to the bases with no decreased or absent breath sounds. Musculoskeletal: Normal speech and language. No gross focal neurologic deficits are appreciated. Cranial nerves: 2-10 normal as tested. Cerebellar: Finger-nose-finger WNL, heel to shin WNL. Sensorimotor: No pronator drift, clonus, sensory loss or abnormal reflexes. Vision: No visual field deficts noted to confrontation.  Speech: No dysarthria or expressive aphasia.  Neurologic:  Normal speech and language. No gross focal neurologic deficits are appreciated.  Skin:  Skin is warm, dry and intact. No rash noted. Psychiatric: Mood and affect are normal. Speech and behavior are normal. Patient exhibits appropriate insight and judgement.   ____________________________________________   LABS (all labs ordered are listed, but only abnormal results are displayed)  Labs Reviewed - No data to display ____________________________________________  EKG   ____________________________________________  RADIOLOGY   No results found.  ____________________________________________    PROCEDURES  Procedure(s) performed:    Procedures    Medications  orphenadrine (NORFLEX) injection 60 mg (60 mg Intramuscular Given 05/21/17 2147)  ketorolac (TORADOL) 30 MG/ML injection 30 mg (30 mg Intramuscular Given 05/21/17 2146)  oxyCODONE-acetaminophen (PERCOCET/ROXICET) 5-325 MG per tablet 1 tablet (1 tablet Oral Given 05/21/17 2257)      ____________________________________________   INITIAL IMPRESSION / ASSESSMENT AND PLAN / ED COURSE  Pertinent labs & imaging results that were available during my care of the patient were reviewed by me and considered in my medical decision making (see chart for details).  Review of the [place] CSRS was performed in accordance of the [place] prior to dispensing any controlled drugs.     Assessment and plan Low back pain with bilateral lower extremity radiculopathy. Patient presents the emergency department with low back pain with bilateral lower extremity radiculopathy. Patient states that she lifted a 450 pound patient at work and has since experienced 10 out of 10 low back pain. Neurologic exam and overall physical exam was reassuring. Patient was given Norflex and Toradol in the emergency department. Patient's pain was refractory to Norflex and Toradol. Patient was given  a one-time dose of Roxicet in the emergency department. Patient was advised that she may not receive narcotic pain medications at discharge. Patient voiced understanding. Patient was advised to follow-up with primary care as needed. All patient questions were answered.   ____________________________________________  FINAL CLINICAL IMPRESSION(S) / ED DIAGNOSES  Final diagnoses:  Acute bilateral low back pain with bilateral sciatica      NEW MEDICATIONS STARTED DURING THIS  VISIT:  New Prescriptions   CYCLOBENZAPRINE (FLEXERIL) 10 MG TABLET    Take 1 tablet (10 mg total) by mouth 3 (three) times daily as needed for muscle spasms.   MELOXICAM (MOBIC) 15 MG TABLET    Take 1 tablet (15 mg total) by mouth daily.        This chart was dictated using voice recognition software/Dragon. Despite best efforts to proofread, errors can occur which can change the meaning. Any change was purely unintentional.    Lannie Fields, PA-C 05/21/17 1595    Darel Hong, MD 05/21/17 [phone]

## 2017-05-21 NOTE — ED Triage Notes (Signed)
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

## 2017-08-26 NOTE — Progress Notes (Deleted)
Gynecology Annual Exam  PCP: Lorelee Market, MD  Chief Complaint: No chief complaint on file.   History of Present Illness: Kristi Gutierrez is a 36 y.o. G3P0 presents for annual exam. The patient {Blank single:19197::"has no complaints today.","complains of ***"}  Her menses are regular, they occur every month, and they last *** days. Her flow is {Blank single:19197::"light","heavy","moderate"}. She {Blank single:19197::"does","does not"} have intermenstrual bleeding. Her last menstrual period was ***. She denies dysmenorrhea. Last pap smear: ***, results were ***   The patient {Blank single:19197::"has never been","is not currently","is not","is"}  sexually active. She currently uses *** for contraception. She {Blank single:19197::"has","does not have"} dyspareunia.  {Blank single:19197::"Since her last visit, she has ***","Since her last visit, she has had no significant changes in her health."}  Her past medical history is remarkable for ***  The patient {Blank single:19197::"does not know how to","does not","does"} perform self breast exams. Her last mammogram was ***, results were ***.   {Blank single:19197::"There is a family history of breast cancer in her ***","There is no family history of breast cancer."} Genetic testing {Blank single:19197::"has","has not"} been done.   {Blank single:19197::"There is a family history of ovarian cancer in her ***","There is no family history of ovarian cancer."} Genetic testing {Blank single:19197::"has","has not"} been done.  The patient {Blank single:19197::"reports smoking. She smokes *** packs per day.","denies smoking."}  She {Blank single:19197::"denies drinking.","reports drinking alcohol. She reports have *** drinks per week."}   She {Blank single:19197::"reports illegal drug use. She uses ***","denies illegal drug use."}  The patient {Blank single:19197::"does not exercise","reports exercising occasionally","reports  exercising regularly"}.  The patient {Blank single:19197::"reports","denies"} current symptoms of depression.    Review of Systems: ROS  Past Medical History:  Past Medical History:  Diagnosis Date  . Migraine    currently active, uses Excedrin  . Pseudotumor cerebri     Past Surgical History:  Past Surgical History:  Procedure Laterality Date  . ATTEMPTED BTL-HAD TO ABORT PROCEDURE  10-2014  . CYST EXCISION    . GANGLION CYST EXCISION Left 12/26/2015   Procedure: REMOVAL GANGLION OF WRIST;  Surgeon: Earnestine Leys, MD;  Location: [facility] ORS;  Service: Orthopedics;  Laterality: Left;  . GANGLION CYST EXCISION Left 04/20/2016   Procedure: REMOVAL GANGLION OF WRIST;  Surgeon: Leanor Kail, MD;  Location: [facility] ORS;  Service: Orthopedics;  Laterality: Left;  . LAPAROSCOPIC TUBAL LIGATION Bilateral 03/01/2015   Procedure: LAPAROSCOPIC TUBAL LIGATION;  Surgeon: Will Bonnet, MD;  Location: [facility] ORS;  Service: Gynecology;  Laterality: Bilateral;  . MOUTH SURGERY    . TUBAL LIGATION      Family History:  No family history on file.  Social History:  Social History   Socioeconomic History  . Marital status: Married    Spouse name: Not on file  . Number of children: Not on file  . Years of education: Not on file  . Highest education level: Not on file  Social Needs  . Financial resource strain: Not on file  . Food insecurity - worry: Not on file  . Food insecurity - inability: Not on file  . Transportation needs - medical: Not on file  . Transportation needs - non-medical: Not on file  Occupational History  . Not on file  Tobacco Use  . Smoking status: Never Smoker  . Smokeless tobacco: Never Used  Substance and Sexual Activity  . Alcohol use: No    Comment: occ  . Drug use: No  . Sexual activity: Yes  Birth control/protection: Other-see comments    Comment: tubal ligation  Other Topics Concern  . Not on file  Social History Narrative  . Not on file     Allergies:  No Known Allergies  Medications: Prior to Admission medications   Medication Sig Start Date End Date Taking? Authorizing Provider  cephALEXin (KEFLEX) 500 MG capsule Take 1 capsule (500 mg total) by mouth 4 (four) times daily. 04/09/17   Pisciotta, Elmyra Ricks, PA-C  HYDROcodone-acetaminophen (NORCO/VICODIN) 5-325 MG tablet Take 1 tablet by mouth every 6 (six) hours as needed for moderate pain. Patient not taking: Reported on 04/09/2017 10/22/16   Laban Emperor, PA-C  naproxen (NAPROSYN) 500 MG tablet Take 1 tablet (500 mg total) by mouth 2 (two) times daily with a meal. Patient not taking: Reported on 04/09/2017 02/20/17   Carrie Mew, MD  ondansetron (ZOFRAN ODT) 4 MG disintegrating tablet Take 1 tablet (4 mg total) by mouth every 8 (eight) hours as needed for nausea or vomiting. Patient not taking: Reported on 04/09/2017 02/20/17   Carrie Mew, MD  phenazopyridine (PYRIDIUM) 200 MG tablet Take 1 tablet (200 mg total) by mouth 3 (three) times daily. 04/09/17   Pisciotta, Elmyra Ricks, PA-C    Physical Exam Vitals: There were no vitals taken for this visit.  General: NAD HEENT: normocephalic, anicteric Neck: no thyroid enlargement, no palpable nodules, no cervical lymphadenopathy  Pulmonary: No increased work of breathing, CTAB Cardiovascular: RRR, {Blank single:19197::"with murmur","without murmur"}  Breast: Breast symmetrical, no tenderness, no palpable nodules or masses, no skin or nipple retraction present, no nipple discharge.  No axillary, infraclavicular or supraclavicular lymphadenopathy. Abdomen: Soft, non-tender, non-distended.  Umbilicus without lesions.  No hepatomegaly or masses palpable. No evidence of hernia. Genitourinary:  External: Normal external female genitalia.  Normal urethral meatus, normal  Bartholin's and Skene's glands.    Vagina: Normal vaginal mucosa, no evidence of prolapse.    Cervix: Grossly normal in appearance, no bleeding,  non-tender  Uterus: Anteverted, normal size, shape, and consistency, mobile, and non-tender  Adnexa: No adnexal masses, non-tender  Rectal: deferred  Lymphatic: no evidence of inguinal lymphadenopathy Extremities: no edema, erythema, or tenderness Neurologic: Grossly intact Psychiatric: mood appropriate, affect full     Assessment: 36 y.o. G3P0 No problem-specific Assessment & Plan notes found for this encounter.   Plan:  ***  1) Breast cancer screening - recommend monthly self breast exam. {Blank single:19197::" ","Mammogram is up to date.","Mammogram was ordered today."}  2) STI screening was offered and {Blank single:19197::"accepted","declined"}.  3) Cervical cancer screening - {Blank single:19197::"Pap smear due in *** years","Pap not indicated","Pap was done"}. ASCCP guidelines and rational discussed.  Patient opts for {Blank single:19197::"every 5 years","every 3 years","yearly"} screening interval  4) Contraception - Education given regarding options for contraception  5) Routine healthcare maintenance including cholesterol and diabetes screening {Blank single:19197::"declined","managed by PCP","ordered today"}

## 2017-08-27 ENCOUNTER — Ambulatory Visit: Payer: Self-pay | Admitting: Certified Nurse Midwife

## 2017-10-10 ENCOUNTER — Other Ambulatory Visit: Payer: Self-pay

## 2017-10-10 ENCOUNTER — Encounter: Payer: Self-pay | Admitting: Obstetrics and Gynecology

## 2017-10-10 ENCOUNTER — Ambulatory Visit (INDEPENDENT_AMBULATORY_CARE_PROVIDER_SITE_OTHER): Payer: Medicaid Other | Admitting: Obstetrics and Gynecology

## 2017-10-10 VITALS — BP 120/76 | HR 84 | Ht 66.5 in | Wt 332.0 lb

## 2017-10-10 DIAGNOSIS — N6321 Unspecified lump in the left breast, upper outer quadrant: Secondary | ICD-10-CM | POA: Diagnosis not present

## 2017-10-10 DIAGNOSIS — Z803 Family history of malignant neoplasm of breast: Secondary | ICD-10-CM | POA: Diagnosis not present

## 2017-10-10 DIAGNOSIS — N644 Mastodynia: Secondary | ICD-10-CM

## 2017-10-10 NOTE — Progress Notes (Signed)
Chief Complaint  Patient presents with  . Breast Pain    Shooting pain in left breast x 1.5 wks     HPI:      [name] is a 37 y.o. G3P0 who LMP was Patient's last menstrual period was 09/11/2017 (approximate)., presents today for breast symptoms. She complains of sharp, shooting pain in the LT breast for the past 1 1/2 wks, waking her up at night. She also has a dull ache in the same area if not having sharp pains. No redness, nipple d/c, mass, but pt isn't confident in her SBE. She drinks caffeine daily. Tried ibup occas. Pat 1/2 sister with breast cancer age 4, unsure if genetic testing done.  Pt had breast u/s about 10 yrs for mass and pain (doesn't remember which side, done in [place]), which showed "fatty mass". Area resolved spontaneously in 3 monhts. No sx since.  Past Medical History:  Diagnosis Date  . Migraine    currently active, uses Excedrin  . Pseudotumor cerebri     Past Surgical History:  Procedure Laterality Date  . ATTEMPTED BTL-HAD TO ABORT PROCEDURE  10-2014  . CYST EXCISION    . GANGLION CYST EXCISION Left 12/26/2015   Procedure: REMOVAL GANGLION OF WRIST;  Surgeon: Earnestine Leys, MD;  Location: [facility] ORS;  Service: Orthopedics;  Laterality: Left;  . GANGLION CYST EXCISION Left 04/20/2016   Procedure: REMOVAL GANGLION OF WRIST;  Surgeon: Leanor Kail, MD;  Location: [facility] ORS;  Service: Orthopedics;  Laterality: Left;  . LAPAROSCOPIC TUBAL LIGATION Bilateral 03/01/2015   Procedure: LAPAROSCOPIC TUBAL LIGATION;  Surgeon: Will Bonnet, MD;  Location: [facility] ORS;  Service: Gynecology;  Laterality: Bilateral;  . MOUTH SURGERY    . TUBAL LIGATION      Family History  Problem Relation Age of Onset  . Breast cancer Sister 63       pat 1/2 sister    No orders of the defined types were placed in this encounter.   ROS:  Review of Systems  Constitutional: Negative for fever, malaise/fatigue and weight loss.  Gastrointestinal: Negative for blood in  stool, constipation, diarrhea, nausea and vomiting.  Genitourinary: Negative for dysuria, flank pain, frequency, hematuria and urgency.  Musculoskeletal: Negative for back pain.  Skin: Negative for itching and rash.   Breast ROS: positive for - new or changing breast lumps   Objective: BP 120/76 (BP Location: Left Arm, Patient Position: Sitting, Cuff Size: Large)   Pulse 84   Ht 5' 6.5" (1.689 m)   Wt (!) 332 lb (150.6 kg)   LMP 09/11/2017 (Approximate)   BMI 52.78 kg/m    Physical Exam  Constitutional: She is oriented to person, place, and time. She appears well-developed.  Pulmonary/Chest: Right breast exhibits no mass, no nipple discharge, no skin change and no tenderness. Left breast exhibits mass and tenderness. Left breast exhibits no nipple discharge and no skin change. There is no breast swelling.  LT BREAST 2:00 POS WITH TENDER, 1 CM MASS    Neurological: She is alert and oriented to person, place, and time. No cranial nerve deficit.  Skin: Skin is warm and dry.  Psychiatric: She has a normal mood and affect. Her behavior is normal. Thought content normal.  Vitals reviewed.     Assessment/Plan: Mass of upper outer quadrant of left breast - 2:00 pos. Check dx mammo/u/s. Will f/u with results. If neg, most likely prominent tissue. D/C caffeine/NSAIDs/ice pack - Plan: MM DIAG BREAST TOMO BILATERAL, US BREAST  LTD UNI LEFT INC AXILLA  Breast pain, left - Plan: MM DIAG BREAST TOMO BILATERAL, US BREAST LTD UNI LEFT INC AXILLA  Family history of breast cancer - 1/2 pat sister with breast cancer age 69. Pt to see if cancer genetic testing done. - Plan: MM DIAG BREAST TOMO BILATERAL, US BREAST LTD UNI LEFT INC AXILLA   F/U  Return if symptoms worsen or fail to improve.  Marissa Weaver B. Chrissie Dacquisto, PA-C 10/10/2017 4:59 PM

## 2017-10-10 NOTE — Patient Instructions (Signed)
I value your feedback and entrusting us with your care. If you get a [facility] patient survey, I would appreciate you taking the time to let us know about your experience today. Thank you! 

## 2017-10-11 ENCOUNTER — Other Ambulatory Visit: Payer: Self-pay | Admitting: Obstetrics and Gynecology

## 2017-10-11 DIAGNOSIS — N63 Unspecified lump in unspecified breast: Secondary | ICD-10-CM

## 2017-10-14 ENCOUNTER — Other Ambulatory Visit: Payer: Self-pay | Admitting: General Surgery

## 2017-10-14 DIAGNOSIS — K449 Diaphragmatic hernia without obstruction or gangrene: Secondary | ICD-10-CM

## 2017-10-15 ENCOUNTER — Other Ambulatory Visit: Payer: Self-pay | Admitting: General Surgery

## 2017-10-22 ENCOUNTER — Ambulatory Visit
Admission: RE | Admit: 2017-10-22 | Discharge: 2017-10-22 | Disposition: A | Discharge status: Home / Self Care | Payer: Medicaid Other | Source: Ambulatory Visit | Attending: General Surgery | Admitting: General Surgery

## 2018-01-20 ENCOUNTER — Telehealth: Payer: Self-pay | Admitting: Obstetrics and Gynecology

## 2018-01-20 NOTE — Telephone Encounter (Signed)
[facility] re: LT breast mass. Pt hasn't had dx mammo and u/s done from 1/19. Pt needed to sign papers at [facility] for previous films to be sent to [place], but not done yet per Izora Gala. No mammo/u/s sched.

## 2018-03-07 ENCOUNTER — Other Ambulatory Visit: Payer: Self-pay

## 2018-03-07 ENCOUNTER — Encounter
Admission: RE | Admit: 2018-03-07 | Discharge: 2018-03-07 | Disposition: A | Discharge status: Home / Self Care | Payer: Medicaid Other | Source: Ambulatory Visit | Attending: Unknown Physician Specialty | Admitting: Unknown Physician Specialty

## 2018-03-07 NOTE — Patient Instructions (Signed)
Your procedure is scheduled OY:[id number] 6/21  Report to Day Surgery. To find out your arrival time please call [phone] between 1PM - 3PM on Thurs 6/20.  Remember: Instructions that are not followed completely may result in serious medical risk, up to and including death, or upon the discretion of your surgeon and anesthesiologist your surgery may need to be rescheduled.     _X__ 1. Do not eat food after midnight the night before your procedure.                 No gum chewing or hard candies. You may drink clear liquids up to 2 hours                 before you are scheduled to arrive for your surgery- DO not drink clear                 liquids within 2 hours of the start of your surgery.                 Clear Liquids include:  water, apple juice without pulp, clear carbohydrate                 drink such as Clearfast of Gartorade, Black Coffee or Tea (Do not add                 anything to coffee or tea).  __X__2.  On the morning of surgery brush your teeth with toothpaste and water, you  may rinse your mouth with mouthwash if you wish.  Do not swallow any toothpaste of mouthwash.     _X__ 3.  No Alcohol for 24 hours before or after surgery.   ___ 4.  Do Not Smoke or use e-cigarettes For 24 Hours Prior to Your Surgery.                 Do not use any chewable tobacco products for at least 6 hours prior to                 surgery.  ____  5.  Bring all medications with you on the day of surgery if instructed.   __x__  6.  Notify your doctor if there is any change in your medical condition      (cold, fever, infections).     Do not wear jewelry, make-up, hairpins, clips or nail polish. Do not wear lotions, powders, or perfumes. You may wear deodorant. Do not shave 48 hours prior to surgery. Men may shave face and neck. Do not bring valuables to the hospital.    [facility] is not responsible for any belongings or valuables.  Contacts, dentures or bridgework  may not be worn into surgery. Leave your suitcase in the car. After surgery it may be brought to your room. For patients admitted to the hospital, discharge time is determined by your treatment team.   Patients discharged the day of surgery will not be allowed to drive home.   Please read over the following fact sheets that you were given:    __x__ Take these medicines the morning of surgery with A SIP OF WATER:    1. none  2.   3.   4.  5.  6.  ____ Fleet Enema (as directed)   ____ Use CHG Soap as directed  ____ Use inhalers on the day of surgery  ____ Stop metformin 2 days prior to surgery    ____ Take 1/2  of usual insulin dose the night before surgery. No insulin the morning          of surgery.   ____ Stop Coumadin/Plavix/aspirin on   __x__ Stop Anti-inflammatories today   ____ Stop supplements until after surgery.    ____ Bring C-Pap to the hospital.

## 2018-03-14 ENCOUNTER — Ambulatory Visit: Payer: Medicaid Other | Admitting: Anesthesiology

## 2018-03-14 ENCOUNTER — Encounter: Payer: Self-pay | Admitting: Anesthesiology

## 2018-03-14 ENCOUNTER — Encounter
Admission: RE | Disposition: A | Discharge status: Home / Self Care | Payer: Self-pay | Source: Ambulatory Visit | Attending: Unknown Physician Specialty

## 2018-03-14 ENCOUNTER — Ambulatory Visit
Admission: RE | Admit: 2018-03-14 | Discharge: 2018-03-14 | Disposition: A | Discharge status: Home / Self Care | Payer: Medicaid Other | Source: Ambulatory Visit | Attending: Unknown Physician Specialty | Admitting: Unknown Physician Specialty

## 2018-03-14 DIAGNOSIS — D1721 Benign lipomatous neoplasm of skin and subcutaneous tissue of right arm: Secondary | ICD-10-CM | POA: Insufficient documentation

## 2018-03-14 DIAGNOSIS — R2231 Localized swelling, mass and lump, right upper limb: Secondary | ICD-10-CM | POA: Diagnosis present

## 2018-03-14 HISTORY — PX: EXCISION MASS UPPER EXTREMETIES: SHX6704

## 2018-03-14 LAB — POCT PREGNANCY, URINE: PREG TEST UR: NEGATIVE

## 2018-03-14 SURGERY — EXCISION MASS UPPER EXTREMITIES
Anesthesia: General | Site: Elbow | Laterality: Right | Wound class: "Clean "

## 2018-03-14 MED ORDER — FAMOTIDINE 20 MG PO TABS
ORAL_TABLET | ORAL | Status: AC
  Filled 2018-03-14: qty 1

## 2018-03-14 MED ORDER — FENTANYL CITRATE (PF) 100 MCG/2ML IJ SOLN
25.0000 ug | INTRAMUSCULAR | Status: DC | PRN
  Administered 2018-03-14 (×4): 25 ug via INTRAVENOUS

## 2018-03-14 MED ORDER — HYDROCODONE-ACETAMINOPHEN 5-325 MG PO TABS
ORAL_TABLET | ORAL | Status: AC
  Filled 2018-03-14: qty 1

## 2018-03-14 MED ORDER — ONDANSETRON HCL 4 MG/2ML IJ SOLN: 4.0000 mg | Freq: Once | INTRAMUSCULAR | Status: DC | PRN

## 2018-03-14 MED ORDER — FENTANYL CITRATE (PF) 100 MCG/2ML IJ SOLN
INTRAMUSCULAR | Status: AC
  Administered 2018-03-14: 25 ug via INTRAVENOUS
  Filled 2018-03-14: qty 2

## 2018-03-14 MED ORDER — MIDAZOLAM HCL 2 MG/2ML IJ SOLN
INTRAMUSCULAR | Status: AC
  Filled 2018-03-14: qty 2

## 2018-03-14 MED ORDER — SEVOFLURANE IN SOLN
RESPIRATORY_TRACT | Status: AC
  Filled 2018-03-14: qty 250

## 2018-03-14 MED ORDER — ONDANSETRON HCL 4 MG/2ML IJ SOLN
INTRAMUSCULAR | Status: DC | PRN
  Administered 2018-03-14: 4 mg via INTRAVENOUS

## 2018-03-14 MED ORDER — GLYCOPYRROLATE 0.2 MG/ML IJ SOLN
INTRAMUSCULAR | Status: DC | PRN
  Administered 2018-03-14: 0.2 mg via INTRAVENOUS

## 2018-03-14 MED ORDER — FENTANYL CITRATE (PF) 100 MCG/2ML IJ SOLN
INTRAMUSCULAR | Status: DC | PRN
  Administered 2018-03-14 (×2): 50 ug via INTRAVENOUS

## 2018-03-14 MED ORDER — NORCO 5-325 MG PO TABS: 1.0000 | ORAL_TABLET | Freq: Four times a day (QID) | ORAL | 0 refills | Status: AC | PRN

## 2018-03-14 MED ORDER — DEXAMETHASONE SODIUM PHOSPHATE 10 MG/ML IJ SOLN
INTRAMUSCULAR | Status: DC | PRN
  Administered 2018-03-14: 10 mg via INTRAVENOUS

## 2018-03-14 MED ORDER — ACETAMINOPHEN 10 MG/ML IV SOLN
INTRAVENOUS | Status: AC
  Filled 2018-03-14: qty 100

## 2018-03-14 MED ORDER — LACTATED RINGERS IV SOLN
INTRAVENOUS | Status: DC
  Administered 2018-03-14: 09:00:00 via INTRAVENOUS

## 2018-03-14 MED ORDER — MIDAZOLAM HCL 2 MG/2ML IJ SOLN
INTRAMUSCULAR | Status: DC | PRN
  Administered 2018-03-14: 2 mg via INTRAVENOUS

## 2018-03-14 MED ORDER — FAMOTIDINE 20 MG PO TABS
20.0000 mg | ORAL_TABLET | Freq: Once | ORAL | Status: AC
  Administered 2018-03-14: 20 mg via ORAL

## 2018-03-14 MED ORDER — PROPOFOL 10 MG/ML IV BOLUS
INTRAVENOUS | Status: AC
  Filled 2018-03-14: qty 20

## 2018-03-14 MED ORDER — HYDROCODONE-ACETAMINOPHEN 5-325 MG PO TABS
1.0000 | ORAL_TABLET | Freq: Once | ORAL | Status: AC
  Administered 2018-03-14: 1 via ORAL

## 2018-03-14 MED ORDER — PROPOFOL 10 MG/ML IV BOLUS
INTRAVENOUS | Status: DC | PRN
  Administered 2018-03-14: 200 mg via INTRAVENOUS

## 2018-03-14 MED ORDER — FENTANYL CITRATE (PF) 100 MCG/2ML IJ SOLN
INTRAMUSCULAR | Status: AC
  Filled 2018-03-14: qty 2

## 2018-03-14 MED ORDER — BUPIVACAINE HCL (PF) 0.5 % IJ SOLN
INTRAMUSCULAR | Status: AC
  Filled 2018-03-14: qty 30

## 2018-03-14 MED ORDER — ACETAMINOPHEN 10 MG/ML IV SOLN
INTRAVENOUS | Status: DC | PRN
  Administered 2018-03-14: 1000 mg via INTRAVENOUS

## 2018-03-14 SURGICAL SUPPLY — 33 items
BANDAGE ELASTIC 4 LF NS (GAUZE/BANDAGES/DRESSINGS) ×4 IMPLANT
BLADE SURG SZ10 CARB STEEL (BLADE) ×2 IMPLANT
BNDG COHESIVE 4X5 TAN STRL (GAUZE/BANDAGES/DRESSINGS) ×2 IMPLANT
BNDG ESMARK 4X12 TAN STRL LF (GAUZE/BANDAGES/DRESSINGS) ×2 IMPLANT
BUR 4X45 EGG (BURR) ×1 IMPLANT
CANISTER SUCT 1200ML W/VALVE (MISCELLANEOUS) ×2 IMPLANT
CUFF DUAL TOURNIQUET 18IN DISP (TOURNIQUET CUFF) IMPLANT
CUFF TOURN 24 STER (MISCELLANEOUS) ×1 IMPLANT
DRAPE INCISE IOBAN 66X45 STRL (DRAPES) ×1 IMPLANT
DURAPREP 26ML APPLICATOR (WOUND CARE) ×4 IMPLANT
ELECT REM PT RETURN 9FT ADLT (ELECTROSURGICAL) ×2
ELECTRODE REM PT RTRN 9FT ADLT (ELECTROSURGICAL) ×1 IMPLANT
GAUZE SPONGE 4X4 12PLY STRL (GAUZE/BANDAGES/DRESSINGS) ×2 IMPLANT
GLOVE BIO SURGEON STRL SZ8 (GLOVE) ×4 IMPLANT
GLOVE BIOGEL M STRL SZ7.5 (GLOVE) ×2 IMPLANT
GLOVE INDICATOR 8.0 STRL GRN (GLOVE) ×2 IMPLANT
GOWN STRL REUS W/TWL LRG LVL4 (GOWN DISPOSABLE) ×4 IMPLANT
KIT TURNOVER KIT A (KITS) ×2 IMPLANT
NS IRRIG 500ML POUR BTL (IV SOLUTION) ×2 IMPLANT
PACK EXTREMITY ARMC (MISCELLANEOUS) ×2 IMPLANT
SOL PREP PVP 2OZ (MISCELLANEOUS) ×2
SOLUTION PREP PVP 2OZ (MISCELLANEOUS) ×1 IMPLANT
SPLINT CAST 1 STEP 5X30 WHT (MISCELLANEOUS) ×1 IMPLANT
SPONGE LAP 18X18 RF (DISPOSABLE) IMPLANT
STAPLER SKIN PROX 35W (STAPLE) ×2 IMPLANT
STOCKINETTE IMPERV 14X48 (MISCELLANEOUS) ×2 IMPLANT
SUT ETHILON 3-0 FS-10 30 BLK (SUTURE) ×2
SUT VIC AB 0 CT1 27 (SUTURE)
SUT VIC AB 0 CT1 27XCR 8 STRN (SUTURE) IMPLANT
SUT VIC AB 2-0 SH 27 (SUTURE) ×1
SUT VIC AB 2-0 SH 27XBRD (SUTURE) ×1 IMPLANT
SUTURE EHLN 3-0 FS-10 30 BLK (SUTURE) ×1 IMPLANT
SYR 10ML LL (SYRINGE) IMPLANT

## 2018-03-14 NOTE — Anesthesia Procedure Notes (Signed)
Procedure Name: LMA Insertion Date/Time: 03/14/2018 9:57 AM Performed by: Nelda Marseille, CRNA Pre-anesthesia Checklist: Patient identified, Patient being monitored, Timeout performed, Emergency Drugs available and Suction available Patient Re-evaluated:Patient Re-evaluated prior to induction Oxygen Delivery Method: Circle system utilized Preoxygenation: Pre-oxygenation with 100% oxygen Induction Type: IV induction Ventilation: Mask ventilation without difficulty LMA: LMA inserted LMA Size: 4.0 Tube type: Oral Number of attempts: 1 Placement Confirmation: positive ETCO2 and breath sounds checked- equal and bilateral Tube secured with: Tape Dental Injury: Teeth and Oropharynx as per pre-operative assessment

## 2018-03-14 NOTE — Discharge Instructions (Signed)
Ice pack  Elevation  RTC in about 2 weeks

## 2018-03-14 NOTE — H&P (Signed)
  H and P reviewed. No changes. Uploaded at later date. 

## 2018-03-14 NOTE — Progress Notes (Signed)
Pt states she is nauseated but then eats crackers  Pt states she is in severe but very cheerful and smiling

## 2018-03-14 NOTE — Op Note (Signed)
03/14/2018  10:49 AM  PATIENT:  Kristi Gutierrez  37 y.o. 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  The mass had been causing some discomfort and she was desirous of having it removed.  Consequently the patient was brought to the OR for the procedure.  OP NOTE: Patient was taken to the operating room where satisfactory general anesthesia was achieved.  The right upper extremity was prepped and draped in usual fashion for a procedure about the elbow.  Sterile tourniquet was applied to her right upper arm.  The right upper extremity was exsanguinated and then the tourniquet was inflated. I made about a 2-1/2 to 3 cm transverse incision over the mass.  I bluntly dissected down to the nodular mass.  It was lipomatous in nature..  I went ahead and excised the mass in its entirety.  It measured about 1-1/2 x 2 cm in diameter.  Tourniquet was released at this time.  It had been up about 13 minutes.  Bleeding was controlled with coagulation cautery.  The wound was irrigated with saline.  I then closed the subcu with 2-0 Vicryl and the skin with 4-0 nylon sutures in vertical mattress fashion.  Sterile compression dressing was applied.  The patient was then awakened and transferred to her stretcher bed.  She was taken to the recovery room in satisfactory condition.  Blood loss was negligible.

## 2018-03-14 NOTE — Anesthesia Preprocedure Evaluation (Signed)
Anesthesia Evaluation  Patient identified by MRN, date of birth, ID band Patient awake    Reviewed: Allergy & Precautions, NPO status , Patient's Chart, lab work & pertinent test results, reviewed documented beta blocker date and time   Airway Mallampati: II  TM Distance: >3 FB     Dental  (+) Chipped   Pulmonary           Cardiovascular      Neuro/Psych  Headaches,    GI/Hepatic   Endo/Other    Renal/GU      Musculoskeletal   Abdominal   Peds  Hematology   Anesthesia Other Findings Pseudotumor cerebri.  Reproductive/Obstetrics                             Anesthesia Physical Anesthesia Plan  ASA: III  Anesthesia Plan: General   Post-op Pain Management:    Induction: Intravenous  PONV Risk Score and Plan:   Airway Management Planned: Oral ETT and LMA  Additional Equipment:   Intra-op Plan:   Post-operative Plan:   Informed Consent: I have reviewed the patients History and Physical, chart, labs and discussed the procedure including the risks, benefits and alternatives for the proposed anesthesia with the patient or authorized representative who has indicated his/her understanding and acceptance.     Plan Discussed with: CRNA  Anesthesia Plan Comments:         Anesthesia Quick Evaluation

## 2018-03-14 NOTE — Transfer of Care (Signed)
Immediate Anesthesia Transfer of Care Note  Patient: Nitasha Jewel  Procedure(s) Performed: EXCISION ELBOW MASS (Right Elbow)  Patient Location: PACU  Anesthesia Type:General  Level of Consciousness: awake, alert  and oriented  Airway & Oxygen Therapy: Patient Spontanous Breathing and Patient connected to face mask oxygen  Post-op Assessment: Report given to RN and Post -op Vital signs reviewed and stable  Post vital signs: Reviewed and stable  Last Vitals:  Vitals Value Taken Time  BP    Temp    Pulse 87 03/14/2018 10:40 AM  Resp 14 03/14/2018 10:40 AM  SpO2 100 % 03/14/2018 10:40 AM  Vitals shown include unvalidated device data.  Last Pain:  Vitals:   03/14/18 0901  TempSrc: Oral  PainSc: 7          Complications: No apparent anesthesia complications

## 2018-03-14 NOTE — Anesthesia Postprocedure Evaluation (Signed)
Anesthesia Post Note  Patient: Kristi Gutierrez  Procedure(s) Performed: EXCISION ELBOW MASS (Right Elbow)  Patient location during evaluation: PACU Anesthesia Type: General Level of consciousness: awake and alert Pain management: pain level controlled Vital Signs Assessment: post-procedure vital signs reviewed and stable Respiratory status: spontaneous breathing, nonlabored ventilation, respiratory function stable and patient connected to nasal cannula oxygen Cardiovascular status: blood pressure returned to baseline and stable Postop Assessment: no apparent nausea or vomiting Anesthetic complications: no     Last Vitals:  Vitals:   03/14/18 1135 03/14/18 1155  BP: 114/83 119/67  Pulse: 78 85  Resp: 16   Temp: (!) 35.7 C   SpO2: 100% 99%    Last Pain:  Vitals:   03/14/18 1155  TempSrc:   PainSc: [place]

## 2018-03-14 NOTE — Anesthesia Post-op Follow-up Note (Signed)
Anesthesia QCDR form completed.        

## 2018-03-15 ENCOUNTER — Encounter: Payer: Self-pay | Admitting: Unknown Physician Specialty

## 2018-03-17 LAB — SURGICAL PATHOLOGY

## 2018-04-13 ENCOUNTER — Encounter: Payer: Self-pay | Admitting: Emergency Medicine

## 2018-04-13 ENCOUNTER — Other Ambulatory Visit: Payer: Self-pay

## 2018-04-13 DIAGNOSIS — G932 Benign intracranial hypertension: Secondary | ICD-10-CM | POA: Insufficient documentation

## 2018-04-13 DIAGNOSIS — N200 Calculus of kidney: Secondary | ICD-10-CM | POA: Diagnosis not present

## 2018-04-13 DIAGNOSIS — K760 Fatty (change of) liver, not elsewhere classified: Secondary | ICD-10-CM | POA: Insufficient documentation

## 2018-04-13 DIAGNOSIS — D282 Benign neoplasm of uterine tubes and ligaments: Secondary | ICD-10-CM | POA: Insufficient documentation

## 2018-04-13 DIAGNOSIS — G43909 Migraine, unspecified, not intractable, without status migrainosus: Secondary | ICD-10-CM | POA: Insufficient documentation

## 2018-04-13 DIAGNOSIS — E669 Obesity, unspecified: Secondary | ICD-10-CM | POA: Insufficient documentation

## 2018-04-13 DIAGNOSIS — Z6841 Body Mass Index (BMI) 40.0 and over, adult: Secondary | ICD-10-CM | POA: Diagnosis not present

## 2018-04-13 DIAGNOSIS — R109 Unspecified abdominal pain: Secondary | ICD-10-CM | POA: Diagnosis present

## 2018-04-13 DIAGNOSIS — N83202 Unspecified ovarian cyst, left side: Secondary | ICD-10-CM | POA: Diagnosis present

## 2018-04-13 LAB — BASIC METABOLIC PANEL
Anion gap: 7 (ref 5–15)
BUN: 11 mg/dL (ref 6–20)
CO2: 25 mmol/L (ref 22–32)
CREATININE: 0.67 mg/dL (ref 0.44–1.00)
Calcium: 9 mg/dL (ref 8.9–10.3)
Chloride: 107 mmol/L (ref 98–111)
Glucose, Bld: 112 mg/dL — ABNORMAL HIGH (ref 70–99)
POTASSIUM: 3.6 mmol/L (ref 3.5–5.1)
SODIUM: 139 mmol/L (ref 135–145)

## 2018-04-13 LAB — CBC
HEMATOCRIT: 38.2 % (ref 35.0–47.0)
Hemoglobin: 13.3 g/dL (ref 12.0–16.0)
MCH: 31.4 pg (ref 26.0–34.0)
MCHC: 34.8 g/dL (ref 32.0–36.0)
MCV: 90.1 fL (ref 80.0–100.0)
Platelets: 210 10*3/uL (ref 150–440)
RBC: 4.24 MIL/uL (ref 3.80–5.20)
RDW: 12.7 % (ref 11.5–14.5)
WBC: 11.1 10*3/uL — AB (ref 3.6–11.0)

## 2018-04-13 MED ORDER — OXYCODONE-ACETAMINOPHEN 5-325 MG PO TABS: 1.0000 | ORAL_TABLET | ORAL | Status: DC | PRN

## 2018-04-13 NOTE — ED Notes (Signed)
Patient to stat desk in no acute distress asking about wait time. Patient given update on wait time. Patient verbalizes understanding.  

## 2018-04-13 NOTE — ED Triage Notes (Signed)
Pt comes into the ED via POV c/o right flank pain that starts at her abdomen and cuts through to her back.  Patient denies any urinary problems or h/o kidney stones.  Patient states she has been nauseas all day and the pain started this morning.  Patient explains the pain is getting more intense as the day continues.  Patient has even and unlabored respirations and is ambulatory to triage at this time.

## 2018-04-13 NOTE — ED Notes (Signed)
Patient declined pain medication at this time

## 2018-04-14 ENCOUNTER — Encounter: Payer: Self-pay | Admitting: Anesthesiology

## 2018-04-14 ENCOUNTER — Emergency Department: Payer: Medicaid Other

## 2018-04-14 ENCOUNTER — Inpatient Hospital Stay: Admit: 2018-04-14 | Payer: Medicaid Other | Admitting: Obstetrics and Gynecology

## 2018-04-14 ENCOUNTER — Emergency Department
Admission: EM | Admit: 2018-04-14 | Discharge: 2018-04-14 | Disposition: A | Discharge status: Home / Self Care | Payer: Medicaid Other | Attending: Obstetrics and Gynecology | Admitting: Obstetrics and Gynecology

## 2018-04-14 ENCOUNTER — Emergency Department: Payer: Medicaid Other | Admitting: Anesthesiology

## 2018-04-14 ENCOUNTER — Encounter
Admission: EM | Disposition: A | Discharge status: Home / Self Care | Payer: Self-pay | Source: Home / Self Care | Attending: Emergency Medicine

## 2018-04-14 DIAGNOSIS — N838 Other noninflammatory disorders of ovary, fallopian tube and broad ligament: Secondary | ICD-10-CM

## 2018-04-14 DIAGNOSIS — N83202 Unspecified ovarian cyst, left side: Secondary | ICD-10-CM

## 2018-04-14 DIAGNOSIS — R109 Unspecified abdominal pain: Secondary | ICD-10-CM

## 2018-04-14 HISTORY — PX: LAPAROSCOPIC UNILATERAL SALPINGECTOMY: SHX5934

## 2018-04-14 HISTORY — PX: LAPAROSCOPIC OVARIAN CYSTECTOMY: SHX6248

## 2018-04-14 LAB — URINALYSIS, COMPLETE (UACMP) WITH MICROSCOPIC
Bilirubin Urine: NEGATIVE
Glucose, UA: NEGATIVE mg/dL
Ketones, ur: NEGATIVE mg/dL
Leukocytes, UA: NEGATIVE
Nitrite: NEGATIVE
PH: 5 (ref 5.0–8.0)
Protein, ur: 100 mg/dL — AB
SPECIFIC GRAVITY, URINE: 1.02 (ref 1.005–1.030)

## 2018-04-14 LAB — HEPATIC FUNCTION PANEL
ALT: 54 U/L — AB (ref 0–44)
AST: 43 U/L — AB (ref 15–41)
Albumin: 3.6 g/dL (ref 3.5–5.0)
Alkaline Phosphatase: 78 U/L (ref 38–126)
Bilirubin, Direct: <0.1 mg/dL (ref 0.0–0.2)
TOTAL PROTEIN: 7.4 g/dL (ref 6.5–8.1)
Total Bilirubin: 0.7 mg/dL (ref 0.3–1.2)

## 2018-04-14 LAB — TYPE AND SCREEN
ABO/RH(D): O POS
ANTIBODY SCREEN: NEGATIVE

## 2018-04-14 LAB — PREGNANCY, URINE: PREG TEST UR: NEGATIVE

## 2018-04-14 LAB — LIPASE, BLOOD: LIPASE: 34 U/L (ref 11–51)

## 2018-04-14 SURGERY — EXCISION, CYST, OVARY, LAPAROSCOPIC
Anesthesia: General | Site: Abdomen | Laterality: Left | Wound class: Clean Contaminated

## 2018-04-14 MED ORDER — LIDOCAINE HCL (PF) 2 % IJ SOLN
INTRAMUSCULAR | Status: AC
  Filled 2018-04-14: qty 10

## 2018-04-14 MED ORDER — IBUPROFEN 800 MG PO TABS: 800.0000 mg | ORAL_TABLET | Freq: Three times a day (TID) | ORAL | 0 refills | Status: DC | PRN

## 2018-04-14 MED ORDER — HYDROMORPHONE HCL 1 MG/ML IJ SOLN
0.5000 mg | INTRAMUSCULAR | Status: AC | PRN
  Administered 2018-04-14 (×4): 0.5 mg via INTRAVENOUS

## 2018-04-14 MED ORDER — CEFAZOLIN SODIUM-DEXTROSE 2-4 GM/100ML-% IV SOLN: 2.0000 g | INTRAVENOUS | Status: DC

## 2018-04-14 MED ORDER — CEFAZOLIN SODIUM-DEXTROSE 2-3 GM-%(50ML) IV SOLR
INTRAVENOUS | Status: DC | PRN
  Administered 2018-04-14: 2 g via INTRAVENOUS

## 2018-04-14 MED ORDER — KETOROLAC TROMETHAMINE 30 MG/ML IJ SOLN
30.0000 mg | Freq: Once | INTRAMUSCULAR | Status: AC
  Administered 2018-04-14: 30 mg via INTRAVENOUS

## 2018-04-14 MED ORDER — ONDANSETRON HCL 4 MG/2ML IJ SOLN
INTRAMUSCULAR | Status: DC | PRN
  Administered 2018-04-14: 4 mg via INTRAVENOUS

## 2018-04-14 MED ORDER — SUGAMMADEX SODIUM 500 MG/5ML IV SOLN
INTRAVENOUS | Status: AC
  Filled 2018-04-14: qty 5

## 2018-04-14 MED ORDER — MORPHINE SULFATE (PF) 4 MG/ML IV SOLN
8.0000 mg | Freq: Once | INTRAVENOUS | Status: AC
  Administered 2018-04-14: 8 mg via INTRAVENOUS
  Filled 2018-04-14: qty 2

## 2018-04-14 MED ORDER — ONDANSETRON HCL 4 MG/2ML IJ SOLN
4.0000 mg | Freq: Once | INTRAMUSCULAR | Status: AC
  Administered 2018-04-14: 4 mg via INTRAVENOUS
  Filled 2018-04-14: qty 2

## 2018-04-14 MED ORDER — FENTANYL CITRATE (PF) 100 MCG/2ML IJ SOLN
INTRAMUSCULAR | Status: DC | PRN
  Administered 2018-04-14 (×5): 50 ug via INTRAVENOUS

## 2018-04-14 MED ORDER — MIDAZOLAM HCL 2 MG/2ML IJ SOLN
INTRAMUSCULAR | Status: AC
  Filled 2018-04-14: qty 2

## 2018-04-14 MED ORDER — SILVER NITRATE-POT NITRATE 75-25 % EX MISC
CUTANEOUS | Status: AC
  Filled 2018-04-14: qty 1

## 2018-04-14 MED ORDER — MIDAZOLAM HCL 2 MG/2ML IJ SOLN
INTRAMUSCULAR | Status: DC | PRN
  Administered 2018-04-14: 2 mg via INTRAVENOUS

## 2018-04-14 MED ORDER — PHENAZOPYRIDINE HCL 200 MG PO TABS
200.0000 mg | ORAL_TABLET | Freq: Once | ORAL | Status: AC
  Administered 2018-04-14: 200 mg via ORAL
  Filled 2018-04-14: qty 1

## 2018-04-14 MED ORDER — LACTATED RINGERS IV SOLN
INTRAVENOUS | Status: DC | PRN
  Administered 2018-04-14 (×2): via INTRAVENOUS

## 2018-04-14 MED ORDER — HYDROMORPHONE HCL 1 MG/ML IJ SOLN
INTRAMUSCULAR | Status: AC
  Administered 2018-04-14: 0.5 mg via INTRAVENOUS
  Filled 2018-04-14: qty 1

## 2018-04-14 MED ORDER — KETAMINE HCL 10 MG/ML IJ SOLN
INTRAMUSCULAR | Status: AC
  Filled 2018-04-14: qty 1

## 2018-04-14 MED ORDER — PHENYLEPHRINE HCL 10 MG/ML IJ SOLN
INTRAMUSCULAR | Status: AC
  Filled 2018-04-14: qty 1

## 2018-04-14 MED ORDER — ONDANSETRON HCL 4 MG/2ML IJ SOLN
INTRAMUSCULAR | Status: AC
  Filled 2018-04-14: qty 2

## 2018-04-14 MED ORDER — FENTANYL CITRATE (PF) 100 MCG/2ML IJ SOLN
100.0000 ug | Freq: Once | INTRAMUSCULAR | Status: AC
  Administered 2018-04-14: 100 ug via INTRAVENOUS
  Filled 2018-04-14: qty 2

## 2018-04-14 MED ORDER — ROCURONIUM BROMIDE 50 MG/5ML IV SOLN
INTRAVENOUS | Status: AC
  Filled 2018-04-14: qty 2

## 2018-04-14 MED ORDER — SILVER NITRATE-POT NITRATE 75-25 % EX MISC
CUTANEOUS | Status: DC | PRN
  Administered 2018-04-14: 2

## 2018-04-14 MED ORDER — PROPOFOL 10 MG/ML IV BOLUS
INTRAVENOUS | Status: AC
  Filled 2018-04-14: qty 20

## 2018-04-14 MED ORDER — FENTANYL CITRATE (PF) 250 MCG/5ML IJ SOLN
INTRAMUSCULAR | Status: AC
  Filled 2018-04-14: qty 5

## 2018-04-14 MED ORDER — SUCCINYLCHOLINE CHLORIDE 20 MG/ML IJ SOLN
INTRAMUSCULAR | Status: AC
  Filled 2018-04-14: qty 1

## 2018-04-14 MED ORDER — ACETAMINOPHEN 500 MG PO TABS: 1000.0000 mg | ORAL_TABLET | Freq: Four times a day (QID) | ORAL | 2 refills | Status: AC | PRN

## 2018-04-14 MED ORDER — LACTATED RINGERS IV SOLN: INTRAVENOUS | Status: DC

## 2018-04-14 MED ORDER — OXYCODONE-ACETAMINOPHEN 5-325 MG PO TABS
1.0000 | ORAL_TABLET | Freq: Once | ORAL | Status: AC
  Administered 2018-04-14: 1 via ORAL
  Filled 2018-04-14: qty 1

## 2018-04-14 MED ORDER — FENTANYL CITRATE (PF) 100 MCG/2ML IJ SOLN
25.0000 ug | INTRAMUSCULAR | Status: DC | PRN
  Administered 2018-04-14 (×4): 25 ug via INTRAVENOUS

## 2018-04-14 MED ORDER — LIDOCAINE 5 % EX PTCH: 2.0000 | MEDICATED_PATCH | CUTANEOUS | 0 refills | Status: AC

## 2018-04-14 MED ORDER — EPHEDRINE SULFATE 50 MG/ML IJ SOLN
INTRAMUSCULAR | Status: AC
  Filled 2018-04-14: qty 1

## 2018-04-14 MED ORDER — ROCURONIUM BROMIDE 100 MG/10ML IV SOLN
INTRAVENOUS | Status: DC | PRN
  Administered 2018-04-14: 10 mg via INTRAVENOUS
  Administered 2018-04-14: 40 mg via INTRAVENOUS
  Administered 2018-04-14: 10 mg via INTRAVENOUS
  Administered 2018-04-14: 30 mg via INTRAVENOUS

## 2018-04-14 MED ORDER — LIDOCAINE HCL (CARDIAC) PF 100 MG/5ML IV SOSY
1.5000 mg/kg | PREFILLED_SYRINGE | Freq: Once | INTRAVENOUS | Status: AC
Start: 2018-04-14 — End: 2018-04-14
  Administered 2018-04-14: 175 mg via INTRAVENOUS
  Filled 2018-04-14: qty 10

## 2018-04-14 MED ORDER — ONDANSETRON HCL 4 MG/2ML IJ SOLN: 4.0000 mg | Freq: Once | INTRAMUSCULAR | Status: DC | PRN

## 2018-04-14 MED ORDER — LIDOCAINE HCL (CARDIAC) PF 100 MG/5ML IV SOSY
PREFILLED_SYRINGE | INTRAVENOUS | Status: DC | PRN
  Administered 2018-04-14: 100 mg via INTRAVENOUS

## 2018-04-14 MED ORDER — KETAMINE HCL 10 MG/ML IJ SOLN
0.3000 mg/kg | Freq: Once | INTRAMUSCULAR | Status: AC
  Administered 2018-04-14: 35 mg via INTRAVENOUS

## 2018-04-14 MED ORDER — PROPOFOL 10 MG/ML IV BOLUS
INTRAVENOUS | Status: DC | PRN
  Administered 2018-04-14: 200 mg via INTRAVENOUS

## 2018-04-14 MED ORDER — EPHEDRINE SULFATE 50 MG/ML IJ SOLN
INTRAMUSCULAR | Status: DC | PRN
  Administered 2018-04-14 (×2): 10 mg via INTRAVENOUS

## 2018-04-14 MED ORDER — OXYCODONE HCL 5 MG PO TABS
ORAL_TABLET | ORAL | Status: AC
  Administered 2018-04-14: 5 mg via ORAL
  Filled 2018-04-14: qty 1

## 2018-04-14 MED ORDER — OXYCODONE HCL 5 MG PO TABS: 5.0000 mg | ORAL_TABLET | Freq: Four times a day (QID) | ORAL | 0 refills | Status: AC | PRN

## 2018-04-14 MED ORDER — DEXAMETHASONE SODIUM PHOSPHATE 10 MG/ML IJ SOLN
INTRAMUSCULAR | Status: DC | PRN
  Administered 2018-04-14: 10 mg via INTRAVENOUS

## 2018-04-14 MED ORDER — CEFAZOLIN SODIUM 1 G IJ SOLR
INTRAMUSCULAR | Status: AC
  Filled 2018-04-14: qty 10

## 2018-04-14 MED ORDER — OXYCODONE HCL 5 MG PO TABS
5.0000 mg | ORAL_TABLET | Freq: Once | ORAL | Status: AC
  Administered 2018-04-14: 5 mg via ORAL

## 2018-04-14 MED ORDER — ACETAMINOPHEN 10 MG/ML IV SOLN
INTRAVENOUS | Status: DC | PRN
  Administered 2018-04-14: 1000 mg via INTRAVENOUS

## 2018-04-14 MED ORDER — SODIUM CHLORIDE 0.9 % IV BOLUS
1000.0000 mL | Freq: Once | INTRAVENOUS | Status: AC
  Administered 2018-04-14: 1000 mL via INTRAVENOUS

## 2018-04-14 MED ORDER — DEXAMETHASONE SODIUM PHOSPHATE 10 MG/ML IJ SOLN
INTRAMUSCULAR | Status: AC
  Filled 2018-04-14: qty 1

## 2018-04-14 MED ORDER — SUCCINYLCHOLINE CHLORIDE 20 MG/ML IJ SOLN
INTRAMUSCULAR | Status: DC | PRN
  Administered 2018-04-14: 120 mg via INTRAVENOUS

## 2018-04-14 MED ORDER — KETOROLAC TROMETHAMINE 30 MG/ML IJ SOLN
30.0000 mg | Freq: Once | INTRAMUSCULAR | Status: DC
  Filled 2018-04-14: qty 1

## 2018-04-14 MED ORDER — BUPIVACAINE HCL 0.5 % IJ SOLN
INTRAMUSCULAR | Status: DC | PRN
  Administered 2018-04-14: 30 mL

## 2018-04-14 MED ORDER — LORAZEPAM 2 MG/ML IJ SOLN
1.0000 mg | Freq: Once | INTRAMUSCULAR | Status: AC
  Administered 2018-04-14: 1 mg via INTRAVENOUS
  Filled 2018-04-14: qty 1

## 2018-04-14 MED ORDER — FENTANYL CITRATE (PF) 100 MCG/2ML IJ SOLN
INTRAMUSCULAR | Status: AC
  Administered 2018-04-14: 25 ug via INTRAVENOUS
  Filled 2018-04-14: qty 2

## 2018-04-14 SURGICAL SUPPLY — 37 items
ANCHOR TIS RET SYS 235ML (MISCELLANEOUS) IMPLANT
BLADE SURG SZ11 CARB STEEL (BLADE) ×2 IMPLANT
CANISTER SUCT 1200ML W/VALVE (MISCELLANEOUS) ×2 IMPLANT
CATH ROBINSON RED A/P 16FR (CATHETERS) ×2 IMPLANT
CHLORAPREP W/TINT 26ML (MISCELLANEOUS) ×2 IMPLANT
DERMABOND ADVANCED (GAUZE/BANDAGES/DRESSINGS) ×1
DERMABOND ADVANCED .7 DNX12 (GAUZE/BANDAGES/DRESSINGS) ×1 IMPLANT
DRSG TEGADERM 2-3/8X2-3/4 SM (GAUZE/BANDAGES/DRESSINGS) ×6 IMPLANT
GLOVE BIOGEL PI IND STRL 6.5 (GLOVE) ×1 IMPLANT
GLOVE BIOGEL PI INDICATOR 6.5 (GLOVE) ×1
GLOVE SURG SYN 6.5 ES PF (GLOVE) ×2 IMPLANT
GOWN STRL REUS W/ TWL LRG LVL3 (GOWN DISPOSABLE) ×2 IMPLANT
GOWN STRL REUS W/TWL LRG LVL3 (GOWN DISPOSABLE) ×2
GRASPER SUT TROCAR 14GX15 (MISCELLANEOUS) ×2 IMPLANT
IRRIGATION STRYKERFLOW (MISCELLANEOUS) ×1 IMPLANT
IRRIGATOR STRYKERFLOW (MISCELLANEOUS) ×2
IV LACTATED RINGERS 1000ML (IV SOLUTION) ×2 IMPLANT
KIT PINK PAD W/HEAD ARE REST (MISCELLANEOUS) ×2
KIT PINK PAD W/HEAD ARM REST (MISCELLANEOUS) ×1 IMPLANT
LABEL OR SOLS (LABEL) ×2 IMPLANT
LIGASURE VESSEL 5MM BLUNT TIP (ELECTROSURGICAL) ×2 IMPLANT
NS IRRIG 500ML POUR BTL (IV SOLUTION) ×2 IMPLANT
PACK GYN LAPAROSCOPIC (MISCELLANEOUS) ×2 IMPLANT
PAD OB MATERNITY 4.3X12.25 (PERSONAL CARE ITEMS) ×2 IMPLANT
PAD PREP 24X41 OB/GYN DISP (PERSONAL CARE ITEMS) ×2 IMPLANT
SCISSORS METZENBAUM CVD 33 (INSTRUMENTS) IMPLANT
SHEARS HARMONIC ACE PLUS 36CM (ENDOMECHANICALS) IMPLANT
SLEEVE ENDOPATH XCEL 5M (ENDOMECHANICALS) ×4 IMPLANT
SPONGE GAUZE 2X2 8PLY STRL LF (GAUZE/BANDAGES/DRESSINGS) ×4 IMPLANT
SUT VIC AB 2-0 UR6 27 (SUTURE) ×2 IMPLANT
SUT VIC AB 4-0 PS2 18 (SUTURE) ×4 IMPLANT
SYR 10ML LL (SYRINGE) ×4 IMPLANT
TROCAR 12M 150ML BLUNT (TROCAR) ×2 IMPLANT
TROCAR 5M 150ML BLDLS (TROCAR) ×6 IMPLANT
TROCAR ENDO BLADELESS 11MM (ENDOMECHANICALS) ×2 IMPLANT
TROCAR XCEL NON-BLD 5MMX100MML (ENDOMECHANICALS) ×2 IMPLANT
TUBING INSUFFLATION (TUBING) ×2 IMPLANT

## 2018-04-14 NOTE — Transfer of Care (Signed)
Immediate Anesthesia Transfer of Care Note  Patient: Kristi Gutierrez  Procedure(s) Performed: LAPAROSCOPIC OVARIAN CYSTECTOMY (Left Abdomen) LAPAROSCOPIC UNILATERAL SALPINGECTOMY (Left Abdomen)  Patient Location: PACU  Anesthesia Type:General  Level of Consciousness: sedated  Airway & Oxygen Therapy: Patient Spontanous Breathing and Patient connected to face mask oxygen  Post-op Assessment: Report given to RN and Post -op Vital signs reviewed and stable  Post vital signs: Reviewed and stable  Last Vitals:  Vitals Value Taken Time  BP    Temp    Pulse 96 04/14/2018 10:50 AM  Resp    SpO2 100 % 04/14/2018 10:50 AM  Vitals shown include unvalidated device data.  Last Pain:  Vitals:   04/14/18 0832  TempSrc: Tympanic  PainSc: 10-Worst pain ever         Complications: No apparent anesthesia complications

## 2018-04-14 NOTE — H&P (Signed)
Kristi Gutierrez is an 37 y.o. female.  HPI: She presented to the ER overnight with severe abdominal pain. She stated that the pain began on Thursday but has gotten progressively worse. She has felts two separate pains in her abdomen her back which she feels that she has been able to distinguish as kidney stone pain and ovarian pain. The pain in her abdomen was made acutely worse Sunday evening when she bent over to pick up a large container of oil. She has had two episodes of emesis overnight here in the ER. Her pain has not been able to be controlled with pain medicine. She describes the pain as a tightness as if you were wrapping a rubber band tightly around your finger.    She does report that in the past surgeons have had a difficult time operating on her because she is "fluffy." She has a postpartum tubal ligation that was aborted, but a successful laparoscopic interval tubal.   Past Medical History:  Diagnosis Date  . Migraine    currently active, uses Excedrin  . Pseudotumor cerebri     Past Surgical History:  Procedure Laterality Date  . ATTEMPTED BTL-HAD TO ABORT PROCEDURE  10-2014  . CYST EXCISION    . EXCISION MASS UPPER EXTREMETIES Right 03/14/2018   Procedure: EXCISION ELBOW MASS;  Surgeon: Leanor Kail, MD;  Location: [facility] ORS;  Service: Orthopedics;  Laterality: Right;  . GANGLION CYST EXCISION Left 12/26/2015   Procedure: REMOVAL GANGLION OF WRIST;  Surgeon: Earnestine Leys, MD;  Location: [facility] ORS;  Service: Orthopedics;  Laterality: Left;  . GANGLION CYST EXCISION Left 04/20/2016   Procedure: REMOVAL GANGLION OF WRIST;  Surgeon: Leanor Kail, MD;  Location: [facility] ORS;  Service: Orthopedics;  Laterality: Left;  . LAPAROSCOPIC TUBAL LIGATION Bilateral 03/01/2015   Procedure: LAPAROSCOPIC TUBAL LIGATION;  Surgeon: Will Bonnet, MD;  Location: [facility] ORS;  Service: Gynecology;  Laterality: Bilateral;  . MOUTH SURGERY    . TUBAL LIGATION      Family History  Problem  Relation Age of Onset  . Breast cancer Sister 39       pat 1/2 sister    Social History:  reports that she has never smoked. She has never used smokeless tobacco. She reports that she drinks alcohol. She reports that she does not use drugs.  Allergies: No Known Allergies  Medications: I have reviewed the patient's current medications.  Results for orders placed or performed during the hospital encounter of 04/14/18 (from the past 48 hour(s))  Basic metabolic panel     Status: Abnormal   Collection Time: 04/13/18 10:25 PM  Result Value Ref Range   Sodium 139 135 - 145 mmol/L   Potassium 3.6 3.5 - 5.1 mmol/L   Chloride 107 98 - 111 mmol/L    Comment: Please note change in reference range.   CO2 25 22 - 32 mmol/L   Glucose, Bld 112 (H) 70 - 99 mg/dL    Comment: Please note change in reference range.   BUN 11 6 - 20 mg/dL    Comment: Please note change in reference range.   Creatinine, Ser 0.67 0.44 - 1.00 mg/dL   Calcium 9.0 8.9 - 10.3 mg/dL   GFR calc non Af Amer >60 >60 mL/min   GFR calc Af Amer >60 >60 mL/min    Comment: (NOTE) The eGFR has been calculated using the CKD EPI equation. This calculation has not been validated in all clinical situations. eGFR's persistently <60 mL/min signify possible  Chronic Kidney Disease.    Anion gap 7 5 - 15    Comment: Performed at [facility], [address] [place], [place] [address]  CBC     Status: Abnormal   Collection Time: 04/13/18 10:25 PM  Result Value Ref Range   WBC 11.1 (H) 3.6 - 11.0 K/uL   RBC 4.24 3.80 - 5.20 MIL/uL   Hemoglobin 13.3 12.0 - 16.0 g/dL   HCT 38.2 35.0 - 47.0 %   MCV 90.1 80.0 - 100.0 fL   MCH 31.4 26.0 - 34.0 pg   MCHC 34.8 32.0 - 36.0 g/dL   RDW 12.7 11.5 - 14.5 %   Platelets 210 150 - 440 K/uL    Comment: Performed at [facility], [address] [place], [place] [address]  Lipase, blood     Status: None   Collection Time: 04/13/18 10:25 PM  Result Value Ref Range   Lipase 34  11 - 51 U/L    Comment: Performed at [facility], [address] [place], [place] [address]  Hepatic function panel     Status: Abnormal   Collection Time: 04/13/18 10:25 PM  Result Value Ref Range   Total Protein 7.4 6.5 - 8.1 g/dL   Albumin 3.6 3.5 - 5.0 g/dL   AST 43 (H) 15 - 41 U/L   ALT 54 (H) 0 - 44 U/L    Comment: Please note change in reference range.   Alkaline Phosphatase 78 38 - 126 U/L   Total Bilirubin 0.7 0.3 - 1.2 mg/dL   Bilirubin, Direct <0.1 0.0 - 0.2 mg/dL    Comment: Please note change in reference range.   Indirect Bilirubin NOT CALCULATED 0.3 - 0.9 mg/dL    Comment: Performed at [facility], [address] [place], [place] [address]  Urinalysis, Complete w Microscopic     Status: Abnormal   Collection Time: 04/14/18  1:18 AM  Result Value Ref Range   Color, Urine YELLOW (A) YELLOW   APPearance HAZY (A) CLEAR   Specific Gravity, Urine 1.020 1.005 - 1.030   pH 5.0 5.0 - 8.0   Glucose, UA NEGATIVE NEGATIVE mg/dL   Hgb urine dipstick MODERATE (A) NEGATIVE   Bilirubin Urine NEGATIVE NEGATIVE   Ketones, ur NEGATIVE NEGATIVE mg/dL   Protein, ur 100 (A) NEGATIVE mg/dL   Nitrite NEGATIVE NEGATIVE   Leukocytes, UA NEGATIVE NEGATIVE   RBC / HPF 6-10 0 - 5 RBC/hpf   WBC, UA 6-10 0 - 5 WBC/hpf   Bacteria, UA RARE (A) NONE SEEN   Squamous Epithelial / LPF 0-5 0 - 5   Mucus PRESENT     Comment: Performed at [facility], [address] [place], [place] [address]  Pregnancy, urine     Status: None   Collection Time: 04/14/18  1:18 AM  Result Value Ref Range   Preg Test, Ur NEGATIVE NEGATIVE    Comment: Performed at [facility], [address] [place],  [address]    US Transvaginal Non-ob  Result Date: 04/14/2018 CLINICAL DATA:  7.3 cm adnexal cystic mass seen on CT today. EXAM: TRANSABDOMINAL AND TRANSVAGINAL ULTRASOUND OF PELVIS DOPPLER ULTRASOUND OF OVARIES TECHNIQUE: Both transabdominal and transvaginal  ultrasound examinations of the pelvis were performed. Transabdominal technique was performed for global imaging of the pelvis including uterus, ovaries, adnexal regions, and pelvic cul-de-sac. It was necessary to proceed with endovaginal exam following the transabdominal exam to visualize the ovaries and endometrium. Color and  duplex Doppler ultrasound was utilized to evaluate blood flow to the ovaries. COMPARISON:  CT 04/14/2018 FINDINGS: Uterus Measurements: 11.4 x 4.1 x 5.4 cm. Uterus is anteverted. No fibroids or other mass visualized. Endometrium Thickness: 19 mm.  No focal abnormality visualized. Right ovary Measurements: 3.5 x 2.5 x 2.5 cm. Normal appearance/no adnexal mass. Left ovary Visualization of the left ovary is limited due to patient's body habitus and inability to void bladder before the examination. There appear to be 2 adjacent lesions in the left ovarian region. A larger mixed hypoechoic lesion measures 6.8 x 4.7 x 5.5 cm and a smaller hypoechoic lesion measures 1.9 cm greatest dimension. Arterial and venous waveform patterns are demonstrated on Doppler imaging in the periphery of the larger lesion. Characterization of this lesion is limited but it does not appear to be simple cyst. Differential diagnosis would include a cystic mass, hemorrhagic cyst, endometrioma or dermoid cyst. Recommend follow-up examination in 6-12 weeks. Pulsed Doppler evaluation of the right ovary demonstrates normal low-resistance arterial and venous waveforms. See above comments for Doppler evaluation of the left ovary. Other findings No abnormal free fluid. IMPRESSION: 1. Evaluation of the left ovary is limited by technical factors as discussed. There is an indeterminate mixed hypoechoic lesion measuring up to 6.8 cm diameter. Follow-up examination in 6-12 weeks is recommended for further characterization. 2. Mild prominence of endometrial stripe thickness, probably physiologic. This could also be assessed at follow-up.  3. Normal ultrasound appearance of the right ovary. Electronically Signed   By: Lucienne Capers M.D.   On: 04/14/2018 06:40   US Pelvis Complete  Result Date: 04/14/2018 CLINICAL DATA:  7.3 cm adnexal cystic mass seen on CT today. EXAM: TRANSABDOMINAL AND TRANSVAGINAL ULTRASOUND OF PELVIS DOPPLER ULTRASOUND OF OVARIES TECHNIQUE: Both transabdominal and transvaginal ultrasound examinations of the pelvis were performed. Transabdominal technique was performed for global imaging of the pelvis including uterus, ovaries, adnexal regions, and pelvic cul-de-sac. It was necessary to proceed with endovaginal exam following the transabdominal exam to visualize the ovaries and endometrium. Color and duplex Doppler ultrasound was utilized to evaluate blood flow to the ovaries. COMPARISON:  CT 04/14/2018 FINDINGS: Uterus Measurements: 11.4 x 4.1 x 5.4 cm. Uterus is anteverted. No fibroids or other mass visualized. Endometrium Thickness: 19 mm.  No focal abnormality visualized. Right ovary Measurements: 3.5 x 2.5 x 2.5 cm. Normal appearance/no adnexal mass. Left ovary Visualization of the left ovary is limited due to patient's body habitus and inability to void bladder before the examination. There appear to be 2 adjacent lesions in the left ovarian region. A larger mixed hypoechoic lesion measures 6.8 x 4.7 x 5.5 cm and a smaller hypoechoic lesion measures 1.9 cm greatest dimension. Arterial and venous waveform patterns are demonstrated on Doppler imaging in the periphery of the larger lesion. Characterization of this lesion is limited but it does not appear to be simple cyst. Differential diagnosis would include a cystic mass, hemorrhagic cyst, endometrioma or dermoid cyst. Recommend follow-up examination in 6-12 weeks. Pulsed Doppler evaluation of the right ovary demonstrates normal low-resistance arterial and venous waveforms. See above comments for Doppler evaluation of the left ovary. Other findings No abnormal free  fluid. IMPRESSION: 1. Evaluation of the left ovary is limited by technical factors as discussed. There is an indeterminate mixed hypoechoic lesion measuring up to 6.8 cm diameter. Follow-up examination in 6-12 weeks is recommended for further characterization. 2. Mild prominence of endometrial stripe thickness, probably physiologic. This could also be assessed at follow-up. 3. Normal  ultrasound appearance of the right ovary. Electronically Signed   By: Lucienne Capers M.D.   On: 04/14/2018 06:40   [place] Art/ven Flow Abd Pelv Doppler  Result Date: 04/14/2018 CLINICAL DATA:  7.3 cm adnexal cystic mass seen on CT today. EXAM: TRANSABDOMINAL AND TRANSVAGINAL ULTRASOUND OF PELVIS DOPPLER ULTRASOUND OF OVARIES TECHNIQUE: Both transabdominal and transvaginal ultrasound examinations of the pelvis were performed. Transabdominal technique was performed for global imaging of the pelvis including uterus, ovaries, adnexal regions, and pelvic cul-de-sac. It was necessary to proceed with endovaginal exam following the transabdominal exam to visualize the ovaries and endometrium. Color and duplex Doppler ultrasound was utilized to evaluate blood flow to the ovaries. COMPARISON:  CT 04/14/2018 FINDINGS: Uterus Measurements: 11.4 x 4.1 x 5.4 cm. Uterus is anteverted. No fibroids or other mass visualized. Endometrium Thickness: 19 mm.  No focal abnormality visualized. Right ovary Measurements: 3.5 x 2.5 x 2.5 cm. Normal appearance/no adnexal mass. Left ovary Visualization of the left ovary is limited due to patient's body habitus and inability to void bladder before the examination. There appear to be 2 adjacent lesions in the left ovarian region. A larger mixed hypoechoic lesion measures 6.8 x 4.7 x 5.5 cm and a smaller hypoechoic lesion measures 1.9 cm greatest dimension. Arterial and venous waveform patterns are demonstrated on Doppler imaging in the periphery of the larger lesion. Characterization of this lesion is limited but  it does not appear to be simple cyst. Differential diagnosis would include a cystic mass, hemorrhagic cyst, endometrioma or dermoid cyst. Recommend follow-up examination in 6-12 weeks. Pulsed Doppler evaluation of the right ovary demonstrates normal low-resistance arterial and venous waveforms. See above comments for Doppler evaluation of the left ovary. Other findings No abnormal free fluid. IMPRESSION: 1. Evaluation of the left ovary is limited by technical factors as discussed. There is an indeterminate mixed hypoechoic lesion measuring up to 6.8 cm diameter. Follow-up examination in 6-12 weeks is recommended for further characterization. 2. Mild prominence of endometrial stripe thickness, probably physiologic. This could also be assessed at follow-up. 3. Normal ultrasound appearance of the right ovary. Electronically Signed   By: Lucienne Capers M.D.   On: 04/14/2018 06:40   Ct Renal Stone Study  Result Date: 04/14/2018 CLINICAL DATA:  Initial evaluation for acute right flank pain. EXAM: CT ABDOMEN AND PELVIS WITHOUT CONTRAST TECHNIQUE: Multidetector CT imaging of the abdomen and pelvis was performed following the standard protocol without IV contrast. COMPARISON:  Prior CT from 02/20/2017 FINDINGS: Lower chest: Visualized lung bases are clear. Hepatobiliary: Mild diffuse hypoattenuation liver suggestive of steatosis. Liver otherwise unremarkable. Gallbladder within normal limits. No biliary dilatation. Pancreas: Pancreas within normal limits. Spleen: Unremarkable. Adrenals/Urinary Tract: Adrenal glands are normal. Kidneys equal in size. Punctate 2 mm nonobstructive stone present within the upper pole of the left kidney. No right-sided nephrolithiasis or ureterolithiasis. Mild asymmetric fullness/prominence of the right renal collecting system as compared to the left, which could reflect a recently passed stone. Possible infection could also be considered. Partially distended bladder within normal limits.  No layering stones within the bladder lumen. Stomach/Bowel: Stomach within normal limits. No evidence for bowel obstruction. No acute inflammatory changes seen about the bowels. No evidence for appendicitis. Vascular/Lymphatic: Intra-abdominal aorta of normal caliber. No adenopathy. Reproductive: Tubal ligation clips noted. Complex cystic left adnexal lesion measures 6.3 x 7.3 cm (series 2, image 79). This could reflect 2 adjacent cysts or possibly single cystic lesion with internal septation. Ovarian origin is suspected. Other: No free air or  fluid. Musculoskeletal: Postoperative scar noted at the ventral abdomen. No acute osseus abnormality. No worrisome lytic or blastic osseous lesions. IMPRESSION: 1. Mild asymmetric fullness of the right renal collecting system as compared to the left without obstructive calculus identified. Findings raise the possibility for a recently passed stone. Possible infection could also be considered. Correlation with urinalysis recommended. 2. Punctate nonobstructive left renal nephrolithiasis. 3. 7.3 cm cystic left adnexal lesion, indeterminate. Further assessment with dedicated pelvic ultrasound recommended. 4. Hepatic steatosis. Electronically Signed   By: Jeannine Boga M.D.   On: 04/14/2018 02:10    ROS Blood pressure (!) 94/56, pulse 88, temperature 97.6 F (36.4 C), temperature source Oral, resp. rate 13, height 5' 6.5" (1.689 m), weight 257 lb (116.6 kg), last menstrual period 03/03/2018, SpO2 100 %. Physical Exam  Nursing note and vitals reviewed. Constitutional: She is oriented to person, place, and time. She appears well-developed and well-nourished.  HENT:  Head: Normocephalic and atraumatic.  Eyes: Pupils are equal, round, and reactive to light. EOM are normal.  Cardiovascular: Normal rate and regular rhythm.  Respiratory: Effort normal and breath sounds normal.  GI: Soft. Bowel sounds are normal. She exhibits no mass. There is tenderness. There is  guarding.  Genitourinary: Vagina normal.  Genitourinary Comments: Exam extremely limited from patient's body habitus.  Diffuse adnexal tenderness.  No discharge, no vaginal bleeding.   Musculoskeletal: Normal range of motion.  Neurological: She is alert and oriented to person, place, and time.  Skin: Skin is warm and dry.  Psychiatric: She has a normal mood and affect. Her behavior is normal. Judgment and thought content normal.    Assessment/Plan: 37 yo with a left ovarian cyst with severe acute onset abdominal pain. Possible occult ovarian torsion vs. Hemorrhagic ovarian cyst. Will take patient for a diagnostic laparoscopy, left ovarian cystectomy possible oophorectomy. Procedure explained in detail to the patient as well as risks and possible complications. Patient consented and agreed.   Christanna R Schuman 04/14/2018, 7:53 AM

## 2018-04-14 NOTE — Anesthesia Preprocedure Evaluation (Signed)
Anesthesia Evaluation  Patient identified by MRN, date of birth, ID band Patient awake    Reviewed: Allergy & Precautions, NPO status , Patient's Chart, lab work & pertinent test results, reviewed documented beta blocker date and time   Airway Mallampati: II  TM Distance: >3 FB     Dental  (+) Chipped   Pulmonary           Cardiovascular      Neuro/Psych  Headaches,    GI/Hepatic   Endo/Other    Renal/GU      Musculoskeletal   Abdominal   Peds  Hematology   Anesthesia Other Findings Obese. Pseudotumor, with headaches, otherwise ok.  Reproductive/Obstetrics                             Anesthesia Physical Anesthesia Plan  ASA: II  Anesthesia Plan: General   Post-op Pain Management:    Induction: Intravenous  PONV Risk Score and Plan:   Airway Management Planned: Oral ETT  Additional Equipment:   Intra-op Plan:   Post-operative Plan:   Informed Consent: I have reviewed the patients History and Physical, chart, labs and discussed the procedure including the risks, benefits and alternatives for the proposed anesthesia with the patient or authorized representative who has indicated his/her understanding and acceptance.     Plan Discussed with: CRNA  Anesthesia Plan Comments:         Anesthesia Quick Evaluation

## 2018-04-14 NOTE — ED Notes (Signed)
Pt back from US

## 2018-04-14 NOTE — ED Notes (Signed)
IV attempt x 2 by paramedic student Phillip Heal.

## 2018-04-14 NOTE — ED Notes (Signed)
OR consent signed at this time by [facility]

## 2018-04-14 NOTE — ED Notes (Signed)
Pt last PO intake was

## 2018-04-14 NOTE — ED Provider Notes (Signed)
[facility] Emergency Department Provider Note  ____________________________________________   First MD Initiated Contact with Patient 04/14/18 [phone]     (approximate)  I have reviewed the triage vital signs and the nursing notes.   HISTORY  Chief Complaint Flank Pain   HPI Kristi Gutierrez is a 37 y.o. female who self presents to the emergency department with sudden onset severe right flank pain radiating towards her right groin that began earlier this evening.  The pain is been constant ever since.  Associated with nausea but no vomiting.  No dysuria.  No hesitancy.  No fevers or chills.  No history of kidney stones.  She does have a remote history of tubal ligation.  She is having difficulty finding a position of comfort and nothing seems to make the pain better.  It is somewhat worsened when touching her right flank or twisting.  The pain is severe sharp and aching.    Past Medical History:  Diagnosis Date  . Migraine    currently active, uses Excedrin  . Pseudotumor cerebri     Patient Active Problem List   Diagnosis Date Noted  . Admission for sterilization 03/01/2015    Past Surgical History:  Procedure Laterality Date  . ATTEMPTED BTL-HAD TO ABORT PROCEDURE  10-2014  . CYST EXCISION    . EXCISION MASS UPPER EXTREMETIES Right 03/14/2018   Procedure: EXCISION ELBOW MASS;  Surgeon: Leanor Kail, MD;  Location: [facility] ORS;  Service: Orthopedics;  Laterality: Right;  . GANGLION CYST EXCISION Left 12/26/2015   Procedure: REMOVAL GANGLION OF WRIST;  Surgeon: Earnestine Leys, MD;  Location: [facility] ORS;  Service: Orthopedics;  Laterality: Left;  . GANGLION CYST EXCISION Left 04/20/2016   Procedure: REMOVAL GANGLION OF WRIST;  Surgeon: Leanor Kail, MD;  Location: [facility] ORS;  Service: Orthopedics;  Laterality: Left;  . LAPAROSCOPIC TUBAL LIGATION Bilateral 03/01/2015   Procedure: LAPAROSCOPIC TUBAL LIGATION;  Surgeon: Will Bonnet, MD;  Location: [facility]  ORS;  Service: Gynecology;  Laterality: Bilateral;  . MOUTH SURGERY    . TUBAL LIGATION      Prior to Admission medications   Medication Sig Start Date End Date Taking? Authorizing Provider  meloxicam (MOBIC) 15 MG tablet Take 1 tablet by mouth daily. 02/04/18   [provider]  traMADol (ULTRAM) 50 MG tablet Take 1 tablet by mouth every 6 (six) hours as needed. 03/04/18   [provider]    Allergies Patient has no known allergies.  Family History  Problem Relation Age of Onset  . Breast cancer Sister 48       pat 1/2 sister    Social History Social History   Tobacco Use  . Smoking status: Never Smoker  . Smokeless tobacco: Never Used  Substance Use Topics  . Alcohol use: Yes    Comment: occ  . Drug use: No    Review of Systems Constitutional: No fever/chills Eyes: No visual changes. ENT: No sore throat. Cardiovascular: Denies chest pain. Respiratory: Denies shortness of breath. Gastrointestinal: Positive for abdominal pain.  Positive for nausea, no vomiting.  No diarrhea.  No constipation. Genitourinary: Negative for dysuria. Musculoskeletal: Positive for back pain. Skin: Negative for rash. Neurological: Negative for headaches, focal weakness or numbness.   ____________________________________________   PHYSICAL EXAM:  VITAL SIGNS: ED Triage Vitals  Enc Vitals Group     BP 04/13/18 2222 130/76     Pulse Rate 04/13/18 2222 89     Resp 04/13/18 2222 18  Temp 04/13/18 2222 97.6 F (36.4 C)     Temp Source 04/13/18 2222 Oral     SpO2 04/13/18 2222 94 %     Weight 04/13/18 2223 257 lb (116.6 kg)     Height 04/13/18 2223 5' 6.5" (1.689 m)     Head Circumference --      Peak Flow --      Pain Score 04/13/18 2222 10     Pain Loc --      Pain Edu? --      Excl. in [place]? --    Constitutional: Alert and oriented x4 turned onto her left side crying in pain appears miserable and unable to find a comfortable position Eyes: PERRL EOMI. Head:  Atraumatic. Nose: No congestion/rhinnorhea. Mouth/Throat: No trismus Neck: No stridor.   Cardiovascular: Normal rate, regular rhythm. Grossly normal heart sounds.  Good peripheral circulation. Respiratory: Increased respiratory effort.  No retractions. Lungs CTAB and moving good air Gastrointestinal: Morbidly obese abdomen diffusely tender with no focality no frank peritonitis.  No costovertebral tenderness Musculoskeletal: No lower extremity edema   Neurologic:  Normal speech and language. No gross focal neurologic deficits are appreciated. Skin:  Skin is warm, dry and intact. No rash noted. Psychiatric: Mood and affect are normal. Speech and behavior are normal.    ____________________________________________   DIFFERENTIAL includes but not limited to  Pyelonephritis, nephrolithiasis, biliary colic, bowel obstruction ____________________________________________   LABS (all labs ordered are listed, but only abnormal results are displayed)  Labs Reviewed  URINALYSIS, COMPLETE (UACMP) WITH MICROSCOPIC - Abnormal; Notable for the following components:      Result Value   Color, Urine YELLOW (*)    APPearance HAZY (*)    Hgb urine dipstick MODERATE (*)    Protein, ur 100 (*)    Bacteria, UA RARE (*)    All other components within normal limits  BASIC METABOLIC PANEL - Abnormal; Notable for the following components:   Glucose, Bld 112 (*)    All other components within normal limits  CBC - Abnormal; Notable for the following components:   WBC 11.1 (*)    All other components within normal limits  HEPATIC FUNCTION PANEL - Abnormal; Notable for the following components:   AST 43 (*)    ALT 54 (*)    All other components within normal limits  LIPASE, BLOOD  PREGNANCY, URINE  CA 125  TYPE AND SCREEN    Lab work reviewed by me show she is not pregnant with no acute  disease __________________________________________  EKG   ____________________________________________  RADIOLOGY  CT stone reviewed by me with trace hydronephrosis on the right but no ureteral stones noted.  She does have a left-sided pelvic mass Pelvic ultrasound reviewed by me with large cystic mass on the left  ____________________________________________   PROCEDURES  Procedure(s) performed: yes  Angiocath insertion Performed by: Darel Hong  Consent: Verbal consent obtained. Risks and benefits: risks, benefits and alternatives were discussed Time out: Immediately prior to procedure a "time out" was called to verify the correct patient, procedure, equipment, support staff and site/side marked as required.  Preparation: Patient was prepped and draped in the usual sterile fashion.  Vein Location: right AC  Ultrasound Guided  Gauge: 20  Normal blood return and flush without difficulty Patient tolerance: Patient tolerated the procedure well with no immediate complications.     Procedures  Critical Care performed: no  ____________________________________________   INITIAL IMPRESSION / ASSESSMENT AND PLAN / ED COURSE  Pertinent  labs & imaging results that were available during my care of the patient were reviewed by me and considered in my medical decision making (see chart for details).   The patient arrives very uncomfortable appearing with right flank pain concerning for kidney stone.  We will begin with a Percocet, IV Toradol, and CT stone.     ----------------------------------------- 4:18 AM on 04/14/2018 -----------------------------------------  The patient has had 30 mg of IV Toradol, 1 Percocet, a total of 200 mg of IV fentanyl, 1.5 mg/kg of IV lidocaine and has had essentially no pain relief whatsoever.  At this point I will progress onto pain control ketamine however I do anticipate the patient would require inpatient admission for pain  control.  Her pain is clearly in her right flank and on her right side although her CT scan shows a left-sided pelvic mass.  We will go ahead and get the pelvic ultrasound now prior to admission. ____________________________________________   The ultrasound is concerning for a large pelvic mass and may have intermittent torsion.  I discussed with on-call OB gynecologist Dr. Gilman Schmidt who will kindly come evaluate the patient.  I do anticipate that the patient will require surgery.   FINAL CLINICAL IMPRESSION(S) / ED DIAGNOSES  Final diagnoses:  Flank pain  Cyst of left ovary      NEW MEDICATIONS STARTED DURING THIS VISIT:  New Prescriptions   No medications on file     Note:  This document was prepared using Dragon voice recognition software and may include unintentional dictation errors.     Darel Hong, MD 04/14/18 [phone]

## 2018-04-14 NOTE — Op Note (Addendum)
  Operative Note   04/14/2018  PRE-OP DIAGNOSIS: Left ovarian cyst, acute abdominal pain, possible torsion.   POST-OP DIAGNOSIS: Large left paratubal cyst   PROCEDURE: Procedure(s): LAPAROSCOPIC LEFT SALPINGECTOMY AND REMOVAL OF PARATUBAL CYST  SURGEON: Christanna Schuman MD  ANESTHESIA: Choice   ESTIMATED BLOOD LOSS: 25 cc  COMPLICATIONS: None  DISPOSITION: PACU - hemodynamically stable.  CONDITION: stable  FINDINGS: Laparoscopic survey of the abdomen revealed a large left paratubal cyst. Grossly normal uterus, ovaries. Enlarged fatty liver edge, gallbladder appeared enlarged without inflammation. Appendix could not be seen because of significant amount of bowel fat.  No intra-abdominal adhesions were noted.  PROCEDURE IN DETAIL: The patient was taken to the OR where anesthesia was administed. The patient was positioned in dorsal lithotomy in the [place]. The patient was then examined under anesthesia with the above noted findings. The patient was prepped and draped in the normal sterile fashion. A Foley catheter was placed in the bladder. Speculum exam normal, Uterus was sounded to 11 cm. A hulka manipulator was placed for manipulation purposes.   An OG tube was placed by anesthesia.   Attention was turned to the patient's abdomen where a 5 mm skin incision was made at palmer's point. A The 5 mm port was then placed under direct visualization with the operative laparoscope. Pneumoperitoneum was obtained. Diagnostic laparoscopy showed the above noted findings.  She was positioned into Trendelenburg.  A 5 mm trocar was then placed in the umbilicus under direct visualization with the laparoscope. A 5 mm trocar was then placed in the left lower quadrant under direct visualization with the laparoscope.   Right and left fallopian tubes are identified and followed out to their fimbria.  The left and right ovaries were identified and normal. The left fallopian tube had a large  paratubal cyst.   The left fallopian tube was grasped and elevated. The ligasure device was used to coagulate, cut, and excise the fallopian tube and paratubal cyst completely. The laparoscopic needle was then used to rupture the cyst wall and drain the simple clear fluid from the cyst.  The fallopian tube and cyst could not be removed through the 28mm port. The umbilical port was extended to a 10 mm port. The cyst wall and left fallopian tube and surgical clip from her tubal ligation were able to be removed through the 25mm port.  The pelvis was irrigated. Inspection of the abdomen and pelvis showed hemostasis.   The 10 mm port was removed. A carter thompson device was used to close the fascia. All instruments and ports were then removed from the abdomen after gas was expelled and patient was leveled.   The skin was closed with 4-0 vicryl and skin adhesive. The foley catheter was removed from the bladder. The hulka was removed from the uterus. A silver nitrate was applied to the cervix to stop a small amount of bleeding from the hulka site. The patient tolerated the procedure well. All counts were correct x 2. The patient was transferred to the recovery room awake, alert and breathing independently.  Adrian Prows MD [facility] 04/14/18 11:07 AM

## 2018-04-14 NOTE — Anesthesia Postprocedure Evaluation (Signed)
Anesthesia Post Note  Patient: Kristi Gutierrez  Procedure(s) Performed: LAPAROSCOPIC OVARIAN CYSTECTOMY (Left Abdomen) LAPAROSCOPIC UNILATERAL SALPINGECTOMY (Left Abdomen)  Patient location during evaluation: PACU Anesthesia Type: General Level of consciousness: awake and alert Pain management: pain level controlled Vital Signs Assessment: post-procedure vital signs reviewed and stable Respiratory status: spontaneous breathing, nonlabored ventilation, respiratory function stable and patient connected to nasal cannula oxygen Cardiovascular status: blood pressure returned to baseline and stable Postop Assessment: no apparent nausea or vomiting Anesthetic complications: no     Last Vitals:  Vitals:   04/14/18 1215 04/14/18 1224  BP: 130/84 120/80  Pulse: 91 78  Resp: 16 16  Temp: 36.4 C   SpO2: 99% 100%    Last Pain:  Vitals:   04/14/18 1215  TempSrc:   PainSc: [place]

## 2018-04-14 NOTE — OR Nursing (Signed)
Discharge instructions discussed with pt and daughter. Both voice understanding. 

## 2018-04-14 NOTE — Anesthesia Post-op Follow-up Note (Signed)
Anesthesia QCDR form completed.        

## 2018-04-14 NOTE — Anesthesia Procedure Notes (Signed)
Procedure Name: Intubation Date/Time: 04/14/2018 8:55 AM Performed by: Nelda Marseille, CRNA Pre-anesthesia Checklist: Patient identified, Patient being monitored, Timeout performed, Emergency Drugs available and Suction available Patient Re-evaluated:Patient Re-evaluated prior to induction Oxygen Delivery Method: Circle system utilized Preoxygenation: Pre-oxygenation with 100% oxygen Induction Type: IV induction Ventilation: Mask ventilation without difficulty Laryngoscope Size: Mac, 3 and McGraph Grade View: Grade III Tube type: Oral Tube size: 7.0 mm Number of attempts: 1 Airway Equipment and Method: Stylet and Video-laryngoscopy Placement Confirmation: ETT inserted through vocal cords under direct vision,  positive ETCO2 and breath sounds checked- equal and bilateral Secured at: 21 cm Tube secured with: Tape Dental Injury: Teeth and Oropharynx as per pre-operative assessment

## 2018-04-14 NOTE — ED Notes (Signed)
MD aware of BP. Ok to give pain meds

## 2018-04-14 NOTE — Discharge Instructions (Signed)
Laparoscopic Salpingectomy Surgery, Care After Refer to this sheet in the next few weeks. These instructions provide you with information about caring for yourself after your procedure. Your health care provider may also give you more specific instructions. Your treatment has been planned according to current medical practices, but problems sometimes occur. Call your health care provider if you have any problems or questions after your procedure. What can I expect after the procedure? After your procedure, it is common to have:  A small amount of blood or clear fluid coming from the cuts made during surgery (incisions).  Mild cramping in your lower abdomen.  Follow these instructions at home: Incision care   Follow instructions from your health care provider about how to take care of your incisions. Make sure you: ? Wash your hands with soap and water before you change your bandage (dressing). If soap and water are not available, use hand sanitizer. ? Change your dressing as told by your health care provider. ? Leave stitches (sutures), skin glue, or adhesive strips in place. These skin closures may need to be in place for 2 weeks or longer. If adhesive strip edges start to loosen and curl up, you may trim the loose edges. Do not remove adhesive strips completely unless your health care provider tells you to do that.  Check your incision areas every day for signs of infection. Check for: ? More redness, swelling, or pain. ? More fluid or blood. ? Warmth. ? Pus or a bad smell.  Keep your incision areas clean and dry. Do not take baths, swim, or use a hot tub until your health care provider approves. General instructions  Take over-the-counter and prescription medicines only as told by your health care provider.  Return to your normal activities as told by your health care provider. Ask your health care provider what activities are safe for you. Ask about: ? Returning to work and  exercise. ? If you have any weight restrictions. ? When it is okay to resume sexual activity.  Drink enough fluid to keep your urine clear or pale yellow.  Keep all follow-up visits as told by your health care provider. This is important. Contact a health care provider if:  You have unusual bleeding or discharge from your vagina.  You have pain in your abdomen that gets worse or does not get better with medicine.  You feel nauseous.  You havemore redness, swelling, or pain at the site of your incisions.  You have more fluid or blood coming from your incisions.  Your incisions feel warm to the touch.  Your have pus or a bad smell coming from your incisions. Get help right away if:  You have a fever.  You have a lot of unusual bleeding or discharge from your vagina.  You have severe pain in your abdomen.  You cannot stop vomiting.  You have nausea that does not get better. This information is not intended to replace advice given to you by your health care provider. Make sure you discuss any questions you have with your health care provider. Document Released: 08/30/2011 Document Revised: 05/09/2016 Document Reviewed: 08/22/2015 Elsevier Interactive Patient Education  2018 [place]   1) The drugs that you were given will stay in your system until tomorrow so for the next 24 hours you should not:  A) Drive an automobile B) Make any legal decisions C) Drink any alcoholic beverage   2) You may resume regular meals  tomorrow.  Today it is better to start with liquids and gradually work up to solid foods.  You may eat anything you prefer, but it is better to start with liquids, then soup and crackers, and gradually work up to solid foods.   3) Please notify your doctor immediately if you have any unusual bleeding, trouble breathing, redness and pain at the surgery site, drainage, fever, or pain not relieved by  medication.    4) Additional Instructions:        Please contact your physician with any problems or Same Day Surgery at [phone], Monday through Friday 6 am to 4 pm, or [facility] number at [phone].

## 2018-04-14 NOTE — ED Notes (Signed)
OB at bedside

## 2018-04-15 ENCOUNTER — Emergency Department (HOSPITAL_COMMUNITY)
Admission: EM | Admit: 2018-04-15 | Discharge: 2018-04-16 | Disposition: A | Discharge status: Home / Self Care | Payer: Medicaid Other | Attending: Emergency Medicine | Admitting: Emergency Medicine

## 2018-04-15 ENCOUNTER — Encounter (HOSPITAL_COMMUNITY): Payer: Self-pay | Admitting: Emergency Medicine

## 2018-04-15 ENCOUNTER — Emergency Department (HOSPITAL_COMMUNITY): Payer: Medicaid Other

## 2018-04-15 DIAGNOSIS — R109 Unspecified abdominal pain: Secondary | ICD-10-CM | POA: Diagnosis present

## 2018-04-15 DIAGNOSIS — R1084 Generalized abdominal pain: Secondary | ICD-10-CM | POA: Insufficient documentation

## 2018-04-15 DIAGNOSIS — Z9889 Other specified postprocedural states: Secondary | ICD-10-CM | POA: Insufficient documentation

## 2018-04-15 LAB — COMPREHENSIVE METABOLIC PANEL
ALBUMIN: 3.3 g/dL — AB (ref 3.5–5.0)
ALT: 40 U/L (ref 0–44)
ANION GAP: 7 (ref 5–15)
AST: 25 U/L (ref 15–41)
Alkaline Phosphatase: 83 U/L (ref 38–126)
BUN: 10 mg/dL (ref 6–20)
CHLORIDE: 110 mmol/L (ref 98–111)
CO2: 23 mmol/L (ref 22–32)
Calcium: 8.9 mg/dL (ref 8.9–10.3)
Creatinine, Ser: 0.7 mg/dL (ref 0.44–1.00)
GFR calc Af Amer: >60 mL/min (ref >60)
GFR calc non Af Amer: >60 mL/min (ref >60)
GLUCOSE: 111 mg/dL — AB (ref 70–99)
POTASSIUM: 3.7 mmol/L (ref 3.5–5.1)
SODIUM: 140 mmol/L (ref 135–145)
Total Bilirubin: 0.4 mg/dL (ref 0.3–1.2)
Total Protein: 6.6 g/dL (ref 6.5–8.1)

## 2018-04-15 LAB — CBC WITH DIFFERENTIAL/PLATELET
ABS IMMATURE GRANULOCYTES: 0.1 10*3/uL (ref 0.0–0.1)
BASOS ABS: 0.1 10*3/uL (ref 0.0–0.1)
Basophils Relative: 0 %
EOS PCT: 0 %
Eosinophils Absolute: 0 10*3/uL (ref 0.0–0.7)
HCT: 38.8 % (ref 36.0–46.0)
Hemoglobin: 12.4 g/dL (ref 12.0–15.0)
Immature Granulocytes: 1 %
Lymphocytes Relative: 28 %
Lymphs Abs: 4 10*3/uL (ref 0.7–4.0)
MCH: 29.8 pg (ref 26.0–34.0)
MCHC: 32 g/dL (ref 30.0–36.0)
MCV: 93.3 fL (ref 78.0–100.0)
Monocytes Absolute: 1.3 10*3/uL — ABNORMAL HIGH (ref 0.1–1.0)
Monocytes Relative: 9 %
NEUTROS ABS: 9.2 10*3/uL — AB (ref 1.7–7.7)
NEUTROS PCT: 62 %
Platelets: 203 10*3/uL (ref 150–400)
RBC: 4.16 MIL/uL (ref 3.87–5.11)
RDW: 12.5 % (ref 11.5–15.5)
WBC: 14.6 10*3/uL — AB (ref 4.0–10.5)

## 2018-04-15 LAB — CA 125: CANCER ANTIGEN (CA) 125: 3.9 U/mL (ref 0.0–38.1)

## 2018-04-15 LAB — SURGICAL PATHOLOGY

## 2018-04-15 MED ORDER — FENTANYL CITRATE (PF) 100 MCG/2ML IJ SOLN
INTRAMUSCULAR | Status: AC
  Filled 2018-04-15: qty 2

## 2018-04-15 MED ORDER — IOHEXOL 300 MG/ML  SOLN
100.0000 mL | Freq: Once | INTRAMUSCULAR | Status: AC | PRN
  Administered 2018-04-15: 100 mL via INTRAVENOUS

## 2018-04-15 MED ORDER — ONDANSETRON HCL 4 MG/2ML IJ SOLN
4.0000 mg | Freq: Once | INTRAMUSCULAR | Status: AC
  Administered 2018-04-15: 4 mg via INTRAVENOUS
  Filled 2018-04-15: qty 2

## 2018-04-15 MED ORDER — KETOROLAC TROMETHAMINE 30 MG/ML IJ SOLN
30.0000 mg | Freq: Once | INTRAMUSCULAR | Status: AC
  Administered 2018-04-15: 30 mg via INTRAVENOUS
  Filled 2018-04-15: qty 1

## 2018-04-15 MED ORDER — FENTANYL CITRATE (PF) 100 MCG/2ML IJ SOLN
50.0000 ug | Freq: Once | INTRAMUSCULAR | Status: AC
  Administered 2018-04-15: 20:00:00 via INTRAMUSCULAR

## 2018-04-15 MED ORDER — HYDROMORPHONE HCL 1 MG/ML IJ SOLN
1.0000 mg | Freq: Once | INTRAMUSCULAR | Status: AC
  Administered 2018-04-15: 1 mg via INTRAVENOUS
  Filled 2018-04-15: qty 1

## 2018-04-15 NOTE — Addendum Note (Signed)
Addendum  created 04/15/18 2230 by Nelda Marseille, CRNA   Intraprocedure Meds edited

## 2018-04-15 NOTE — ED Provider Notes (Signed)
Patient placed in Quick Look pathway, seen and evaluated   Chief Complaint: right flank and right abdominal surgery  HPI:   Pt had surgery yesterday for left ovarian torsion. Pt reports severe pain right flank and right abdomen today  ROS: no fever, no vomiting  Physical Exam:   Gen: No distress  Neuro: Awake and Alert  Skin: Warm    Focused Exam: tender right lower quadrant  (Pt had right hydro on ct scan,  I am unsure if pain is postop or possibly from hydronephrosis second to stone.)   Initiation of care has begun. The patient has been counseled on the process, plan, and necessity for staying for the completion/evaluation, and the remainder of the medical screening examination   Sidney Ace 04/15/18 2004    Tegeler, Gwenyth Allegra, MD 04/16/18 [phone]

## 2018-04-15 NOTE — ED Provider Notes (Signed)
[facility] EMERGENCY DEPARTMENT Provider Note   CSN: [id number] Arrival date & time: 04/15/18  1935     History   Chief Complaint Chief Complaint  Patient presents with  . Flank Pain  . Post-op Problem    HPI Kristi Gutierrez is a 37 y.o. female.  HPI  This is a 38 year old female who presents with persistent right flank pain.  Patient was seen and evaluated yesterday at [facility].  At that time she had a CT scan and an ultrasound.  Ultimately she was found to have a left ovarian mass and likely torsion.  She had surgery yesterday for this.  Patient states that she woke up from surgery and she had persistent right flank pain that was unchanged since before surgery.  She states originally her onset of symptoms Thursday.  The pain comes and goes.  She states that the pain is worse sitting, standing, picking things up.  She has been taking hydrocodone and ibuprofen at home with no relief.  Now states that her pain is 10 out of 10.  She does report now pain all over her abdomen.  She has not had a bowel movement since surgery.  She reports nausea without vomiting.  No fevers.  Does not note any dysuria.  Patient was seen and evaluated by provider in triage.  Work-up reviewed.  CT scan with contrast obtained.  This is largely unremarkable.  CT scan from yesterday did show some dilation of the right renal system concerning for possible recently passed stone.  However, would not expect the patient have ongoing pain.  Past Medical History:  Diagnosis Date  . Migraine    currently active, uses Excedrin  . Pseudotumor cerebri     Patient Active Problem List   Diagnosis Date Noted  . Admission for sterilization 03/01/2015    Past Surgical History:  Procedure Laterality Date  . ATTEMPTED BTL-HAD TO ABORT PROCEDURE  10-2014  . CYST EXCISION    . EXCISION MASS UPPER EXTREMETIES Right 03/14/2018   Procedure: EXCISION ELBOW MASS;  Surgeon: Leanor Kail, MD;   Location: [facility] ORS;  Service: Orthopedics;  Laterality: Right;  . GANGLION CYST EXCISION Left 12/26/2015   Procedure: REMOVAL GANGLION OF WRIST;  Surgeon: Earnestine Leys, MD;  Location: [facility] ORS;  Service: Orthopedics;  Laterality: Left;  . GANGLION CYST EXCISION Left 04/20/2016   Procedure: REMOVAL GANGLION OF WRIST;  Surgeon: Leanor Kail, MD;  Location: [facility] ORS;  Service: Orthopedics;  Laterality: Left;  . LAPAROSCOPIC OVARIAN CYSTECTOMY Left 04/14/2018   Procedure: LAPAROSCOPIC OVARIAN CYSTECTOMY;  Surgeon: Homero Fellers, MD;  Location: [facility] ORS;  Service: Gynecology;  Laterality: Left;  peritubal cyst  . LAPAROSCOPIC TUBAL LIGATION Bilateral 03/01/2015   Procedure: LAPAROSCOPIC TUBAL LIGATION;  Surgeon: Will Bonnet, MD;  Location: [facility] ORS;  Service: Gynecology;  Laterality: Bilateral;  . LAPAROSCOPIC UNILATERAL SALPINGECTOMY Left 04/14/2018   Procedure: LAPAROSCOPIC UNILATERAL SALPINGECTOMY;  Surgeon: Homero Fellers, MD;  Location: [facility] ORS;  Service: Gynecology;  Laterality: Left;  . MOUTH SURGERY    . TUBAL LIGATION       OB History    Gravida  3   Para      Term      Preterm      AB      Living  3     SAB      TAB      Ectopic      Multiple      Live Births  Home Medications    Prior to Admission medications   Medication Sig Start Date End Date Taking? Authorizing Provider  acetaminophen (TYLENOL) 500 MG tablet Take 2 tablets (1,000 mg total) by mouth every 6 (six) hours as needed. 04/14/18 04/14/19  Homero Fellers, MD  ibuprofen (ADVIL,MOTRIN) 800 MG tablet Take 1 tablet (800 mg total) by mouth every 8 (eight) hours as needed. 04/14/18   Schuman, Christanna R, MD  lidocaine (LIDODERM) 5 % Place 2 patches onto the skin daily for 5 days. Remove & Discard patch within 12 hours or as directed by MD 04/14/18 04/19/18  Homero Fellers, MD  oxyCODONE (OXY IR/ROXICODONE) 5 MG immediate release tablet Take 1 tablet (5 mg  total) by mouth every 6 (six) hours as needed for up to 5 days for severe pain. 04/14/18 04/19/18  Homero Fellers, MD    Family History Family History  Problem Relation Age of Onset  . Breast cancer Sister 76       pat 1/2 sister    Social History Social History   Tobacco Use  . Smoking status: Never Smoker  . Smokeless tobacco: Never Used  Substance Use Topics  . Alcohol use: Yes    Comment: occ  . Drug use: No     Allergies   Patient has no known allergies.   Review of Systems Review of Systems  Constitutional: Negative for fever.  Respiratory: Negative for shortness of breath.   Cardiovascular: Negative for chest pain.  Gastrointestinal: Positive for abdominal pain and nausea. Negative for diarrhea and vomiting.  Genitourinary: Positive for flank pain. Negative for dysuria.  Skin: Negative for rash.  All other systems reviewed and are negative.    Physical Exam Updated Vital Signs BP 122/72   Pulse 68   Temp 97.7 F (36.5 C) (Oral)   Resp 16   SpO2 98%   Physical Exam  Constitutional: She is oriented to person, place, and time. She appears well-developed and well-nourished.  Morbidly obese  HENT:  Head: Normocephalic and atraumatic.  Eyes: Pupils are equal, round, and reactive to light.  Cardiovascular: Normal rate, regular rhythm and normal heart sounds.  No murmur heard. Pulmonary/Chest: Effort normal and breath sounds normal. No respiratory distress. She has no wheezes.  Abdominal: Soft. Bowel sounds are normal. There is tenderness.  Diffuse tenderness to palpation, no rebound or guarding, laparoscopic incisions clean dry and intact  Neurological: She is alert and oriented to person, place, and time.  Skin: Skin is warm and dry. No rash noted.  Psychiatric: She has a normal mood and affect.  Nursing note and vitals reviewed.    ED Treatments / Results  Labs (all labs ordered are listed, but only abnormal results are displayed) Labs  Reviewed  CBC WITH DIFFERENTIAL/PLATELET - Abnormal; Notable for the following components:      Result Value   WBC 14.6 (*)    Neutro Abs 9.2 (*)    Monocytes Absolute 1.3 (*)    All other components within normal limits  COMPREHENSIVE METABOLIC PANEL - Abnormal; Notable for the following components:   Glucose, Bld 111 (*)    Albumin 3.3 (*)    All other components within normal limits  URINALYSIS, ROUTINE W REFLEX MICROSCOPIC - Abnormal; Notable for the following components:   APPearance HAZY (*)    Specific Gravity, Urine >1.046 (*)    Hgb urine dipstick LARGE (*)    Leukocytes, UA LARGE (*)    RBC / HPF >50 (*)  All other components within normal limits    EKG None  Radiology US Transvaginal Non-ob  Result Date: 04/14/2018 CLINICAL DATA:  7.3 cm adnexal cystic mass seen on CT today. EXAM: TRANSABDOMINAL AND TRANSVAGINAL ULTRASOUND OF PELVIS DOPPLER ULTRASOUND OF OVARIES TECHNIQUE: Both transabdominal and transvaginal ultrasound examinations of the pelvis were performed. Transabdominal technique was performed for global imaging of the pelvis including uterus, ovaries, adnexal regions, and pelvic cul-de-sac. It was necessary to proceed with endovaginal exam following the transabdominal exam to visualize the ovaries and endometrium. Color and duplex Doppler ultrasound was utilized to evaluate blood flow to the ovaries. COMPARISON:  CT 04/14/2018 FINDINGS: Uterus Measurements: 11.4 x 4.1 x 5.4 cm. Uterus is anteverted. No fibroids or other mass visualized. Endometrium Thickness: 19 mm.  No focal abnormality visualized. Right ovary Measurements: 3.5 x 2.5 x 2.5 cm. Normal appearance/no adnexal mass. Left ovary Visualization of the left ovary is limited due to patient's body habitus and inability to void bladder before the examination. There appear to be 2 adjacent lesions in the left ovarian region. A larger mixed hypoechoic lesion measures 6.8 x 4.7 x 5.5 cm and a smaller hypoechoic lesion  measures 1.9 cm greatest dimension. Arterial and venous waveform patterns are demonstrated on Doppler imaging in the periphery of the larger lesion. Characterization of this lesion is limited but it does not appear to be simple cyst. Differential diagnosis would include a cystic mass, hemorrhagic cyst, endometrioma or dermoid cyst. Recommend follow-up examination in 6-12 weeks. Pulsed Doppler evaluation of the right ovary demonstrates normal low-resistance arterial and venous waveforms. See above comments for Doppler evaluation of the left ovary. Other findings No abnormal free fluid. IMPRESSION: 1. Evaluation of the left ovary is limited by technical factors as discussed. There is an indeterminate mixed hypoechoic lesion measuring up to 6.8 cm diameter. Follow-up examination in 6-12 weeks is recommended for further characterization. 2. Mild prominence of endometrial stripe thickness, probably physiologic. This could also be assessed at follow-up. 3. Normal ultrasound appearance of the right ovary. Electronically Signed   By: Lucienne Capers M.D.   On: 04/14/2018 06:40   US Pelvis Complete  Result Date: 04/14/2018 CLINICAL DATA:  7.3 cm adnexal cystic mass seen on CT today. EXAM: TRANSABDOMINAL AND TRANSVAGINAL ULTRASOUND OF PELVIS DOPPLER ULTRASOUND OF OVARIES TECHNIQUE: Both transabdominal and transvaginal ultrasound examinations of the pelvis were performed. Transabdominal technique was performed for global imaging of the pelvis including uterus, ovaries, adnexal regions, and pelvic cul-de-sac. It was necessary to proceed with endovaginal exam following the transabdominal exam to visualize the ovaries and endometrium. Color and duplex Doppler ultrasound was utilized to evaluate blood flow to the ovaries. COMPARISON:  CT 04/14/2018 FINDINGS: Uterus Measurements: 11.4 x 4.1 x 5.4 cm. Uterus is anteverted. No fibroids or other mass visualized. Endometrium Thickness: 19 mm.  No focal abnormality visualized.  Right ovary Measurements: 3.5 x 2.5 x 2.5 cm. Normal appearance/no adnexal mass. Left ovary Visualization of the left ovary is limited due to patient's body habitus and inability to void bladder before the examination. There appear to be 2 adjacent lesions in the left ovarian region. A larger mixed hypoechoic lesion measures 6.8 x 4.7 x 5.5 cm and a smaller hypoechoic lesion measures 1.9 cm greatest dimension. Arterial and venous waveform patterns are demonstrated on Doppler imaging in the periphery of the larger lesion. Characterization of this lesion is limited but it does not appear to be simple cyst. Differential diagnosis would include a cystic mass, hemorrhagic cyst, endometrioma or  dermoid cyst. Recommend follow-up examination in 6-12 weeks. Pulsed Doppler evaluation of the right ovary demonstrates normal low-resistance arterial and venous waveforms. See above comments for Doppler evaluation of the left ovary. Other findings No abnormal free fluid. IMPRESSION: 1. Evaluation of the left ovary is limited by technical factors as discussed. There is an indeterminate mixed hypoechoic lesion measuring up to 6.8 cm diameter. Follow-up examination in 6-12 weeks is recommended for further characterization. 2. Mild prominence of endometrial stripe thickness, probably physiologic. This could also be assessed at follow-up. 3. Normal ultrasound appearance of the right ovary. Electronically Signed   By: Lucienne Capers M.D.   On: 04/14/2018 06:40   Ct Abdomen Pelvis W Contrast  Result Date: 04/15/2018 CLINICAL DATA:  Continued pain since visit to [facility] yesterday. Right-sided abdominal pain. An ultrasound showed left ovarian torsion. Patient had emergent laparoscopic surgery and was discharged the same day. EXAM: CT ABDOMEN AND PELVIS WITH CONTRAST TECHNIQUE: Multidetector CT imaging of the abdomen and pelvis was performed using the standard protocol following bolus administration of intravenous  contrast. CONTRAST:  166mL OMNIPAQUE IOHEXOL 300 MG/ML  SOLN COMPARISON:  Ultrasound and CT 04/14/2018 FINDINGS: Lower chest: Lung bases are clear, allowing for motion artifact. Hepatobiliary: Diffuse fatty infiltration of the liver. No focal liver abnormality is seen. No gallstones, gallbladder wall thickening, or biliary dilatation. Pancreas: Unremarkable. No pancreatic ductal dilatation or surrounding inflammatory changes. Spleen: Normal in size without focal abnormality. Adrenals/Urinary Tract: No adrenal gland nodules. Kidneys are symmetrical in size. Nephrograms are symmetrical. No hydronephrosis or hydroureter. Bladder is unremarkable. Stomach/Bowel: Stomach, small bowel, and colon are not abnormally distended. No wall thickening or inflammatory changes are appreciated. The appendix is not identified. Vascular/Lymphatic: No significant vascular findings are present. No enlarged abdominal or pelvic lymph nodes. Reproductive: Interval resection of the left ovary. Uterus and right ovary appear normal in size. No abnormal adnexal masses are seen. Surgical clip consistent with right tubal ligation. Other: Minimal free air in the abdomen consistent with postoperative change. No significant free fluid. Infiltration in the anterior abdominal wall consistent with postoperative change. No abscess or cellulitis suggested. Musculoskeletal: No acute or significant osseous findings. IMPRESSION: 1. Diffuse fatty infiltration of the liver. 2. No evidence of bowel obstruction or inflammation. 3. Interval resection of left ovary since previous study. No acute complication is suggested. 4. Minimal free air in the abdomen and infiltration in the anterior abdominal wall are consistent with normal postoperative changes. Electronically Signed   By: Lucienne Capers M.D.   On: 04/15/2018 23:04   [place] Art/ven Flow Abd Pelv Doppler  Result Date: 04/14/2018 CLINICAL DATA:  7.3 cm adnexal cystic mass seen on CT today. EXAM:  TRANSABDOMINAL AND TRANSVAGINAL ULTRASOUND OF PELVIS DOPPLER ULTRASOUND OF OVARIES TECHNIQUE: Both transabdominal and transvaginal ultrasound examinations of the pelvis were performed. Transabdominal technique was performed for global imaging of the pelvis including uterus, ovaries, adnexal regions, and pelvic cul-de-sac. It was necessary to proceed with endovaginal exam following the transabdominal exam to visualize the ovaries and endometrium. Color and duplex Doppler ultrasound was utilized to evaluate blood flow to the ovaries. COMPARISON:  CT 04/14/2018 FINDINGS: Uterus Measurements: 11.4 x 4.1 x 5.4 cm. Uterus is anteverted. No fibroids or other mass visualized. Endometrium Thickness: 19 mm.  No focal abnormality visualized. Right ovary Measurements: 3.5 x 2.5 x 2.5 cm. Normal appearance/no adnexal mass. Left ovary Visualization of the left ovary is limited due to patient's body habitus and inability to void bladder before the examination.  There appear to be 2 adjacent lesions in the left ovarian region. A larger mixed hypoechoic lesion measures 6.8 x 4.7 x 5.5 cm and a smaller hypoechoic lesion measures 1.9 cm greatest dimension. Arterial and venous waveform patterns are demonstrated on Doppler imaging in the periphery of the larger lesion. Characterization of this lesion is limited but it does not appear to be simple cyst. Differential diagnosis would include a cystic mass, hemorrhagic cyst, endometrioma or dermoid cyst. Recommend follow-up examination in 6-12 weeks. Pulsed Doppler evaluation of the right ovary demonstrates normal low-resistance arterial and venous waveforms. See above comments for Doppler evaluation of the left ovary. Other findings No abnormal free fluid. IMPRESSION: 1. Evaluation of the left ovary is limited by technical factors as discussed. There is an indeterminate mixed hypoechoic lesion measuring up to 6.8 cm diameter. Follow-up examination in 6-12 weeks is recommended for further  characterization. 2. Mild prominence of endometrial stripe thickness, probably physiologic. This could also be assessed at follow-up. 3. Normal ultrasound appearance of the right ovary. Electronically Signed   By: Lucienne Capers M.D.   On: 04/14/2018 06:40    Procedures Procedures (including critical care time)  Medications Ordered in ED Medications  fentaNYL (SUBLIMAZE) injection 50 mcg ( Intramuscular Given 04/15/18 2015)  iohexol (OMNIPAQUE) 300 MG/ML solution 100 mL (100 mLs Intravenous Contrast Given 04/15/18 2251)  HYDROmorphone (DILAUDID) injection 1 mg (1 mg Intravenous Given 04/15/18 2336)  ketorolac (TORADOL) 30 MG/ML injection 30 mg (30 mg Intravenous Given 04/15/18 2335)  ondansetron (ZOFRAN) injection 4 mg (4 mg Intravenous Given 04/15/18 2335)  HYDROmorphone (DILAUDID) injection 1 mg (1 mg Intravenous Given 04/16/18 0124)  cyclobenzaprine (FLEXERIL) tablet 5 mg (5 mg Oral Given 04/16/18 0125)     Initial Impression / Assessment and Plan / ED Course  I have reviewed the triage vital signs and the nursing notes.  Pertinent labs & imaging results that were available during my care of the patient were reviewed by me and considered in my medical decision making (see chart for details).  Clinical Course as of Apr 16 257  Wed Apr 16, 2018  0225 On recheck, pain is now better controlled after 2 doses of medication.  Work-up is largely reassuring.  No evidence of UTI.  CT scan shows no evidence of appendicitis or acute obstructing stone.  This was shared with the patient.  We will plan for pain control and scheduled medications at home for the next 24 to 48 hours.   [CH]    Clinical Course User Index [CH] Saray Capasso, Barbette Hair, MD    Patient presents with persistent right flank pain.  She states that surgery did not alleviate this pain and it has been ongoing since she originally presented.  I have reviewed her entire chart.  She had a CT scan that was concerning for a left ovarian  mass.  This was removed given concern for torsion.  However, CT scan also showed some dilation of the right renal collecting system suggestive of possible recently passed stone.  I would think that if she had recently passed a stone, her spasm would have improved by now.  Will concentrate on pain control at this time.  Work-up is otherwise reassuring.  She is overall nontoxic-appearing.  Laparoscopic incisions appear clean dry and intact.  Low suspicion for infection.  Repeat CT scan was reviewed and does not show any evidence of appendicitis or other cause for her pain.  See clinical course above.  Patient improved after 2 doses  of pain medication and fluids.  After history, exam, and medical workup I feel the patient has been appropriately medically screened and is safe for discharge home. Pertinent diagnoses were discussed with the patient. Patient was given return precautions.   Final Clinical Impressions(s) / ED Diagnoses   Final diagnoses:  Generalized abdominal pain    ED Discharge Orders    None       Jarren Para, Barbette Hair, MD 04/16/18 [phone]

## 2018-04-15 NOTE — ED Triage Notes (Signed)
Pt presents with continued pain since visit to [facility] yesterday; pt states she was having R sided abd pain which she thought was her ovary since she has hx of cysts; pt was CT'd and they found kidney stones, US showed L ovarian torsion which she had emergent laproscopic surgery and D/Cd same day

## 2018-04-16 LAB — URINALYSIS, ROUTINE W REFLEX MICROSCOPIC
BACTERIA UA: NONE SEEN
Bilirubin Urine: NEGATIVE
Glucose, UA: NEGATIVE mg/dL
KETONES UR: NEGATIVE mg/dL
Nitrite: NEGATIVE
PROTEIN: NEGATIVE mg/dL
Specific Gravity, Urine: >1.046 — ABNORMAL HIGH (ref 1.005–1.030)
pH: 5 (ref 5.0–8.0)

## 2018-04-16 MED ORDER — CYCLOBENZAPRINE HCL 10 MG PO TABS
5.0000 mg | ORAL_TABLET | Freq: Once | ORAL | Status: AC
  Administered 2018-04-16: 5 mg via ORAL
  Filled 2018-04-16: qty 1

## 2018-04-16 MED ORDER — HYDROMORPHONE HCL 1 MG/ML IJ SOLN
1.0000 mg | Freq: Once | INTRAMUSCULAR | Status: AC
  Administered 2018-04-16: 1 mg via INTRAVENOUS
  Filled 2018-04-16: qty 1

## 2018-04-16 NOTE — Discharge Instructions (Addendum)
You were seen today for abdominal pain and flank pain.  Your work-up is reassuring and your CT scan is negative.  Take scheduled ibuprofen at home as prescribed.  Also make sure to stay on top of her pain with oxycodone.  Follow-up closely with your surgeon.  Make sure that you are taking stool softeners with narcotic pain medication.

## 2018-04-27 ENCOUNTER — Observation Stay
Admission: EM | Admit: 2018-04-27 | Discharge: 2018-04-28 | Disposition: A | Discharge status: Home / Self Care | Payer: Medicaid Other | Attending: Internal Medicine | Admitting: Internal Medicine

## 2018-04-27 ENCOUNTER — Encounter: Payer: Self-pay | Admitting: Emergency Medicine

## 2018-04-27 ENCOUNTER — Other Ambulatory Visit: Payer: Self-pay

## 2018-04-27 ENCOUNTER — Emergency Department: Payer: Medicaid Other

## 2018-04-27 DIAGNOSIS — R109 Unspecified abdominal pain: Secondary | ICD-10-CM | POA: Diagnosis present

## 2018-04-27 DIAGNOSIS — R102 Pelvic and perineal pain: Secondary | ICD-10-CM

## 2018-04-27 DIAGNOSIS — M545 Low back pain: Secondary | ICD-10-CM | POA: Diagnosis not present

## 2018-04-27 DIAGNOSIS — G43909 Migraine, unspecified, not intractable, without status migrainosus: Secondary | ICD-10-CM | POA: Insufficient documentation

## 2018-04-27 DIAGNOSIS — M549 Dorsalgia, unspecified: Secondary | ICD-10-CM | POA: Diagnosis present

## 2018-04-27 NOTE — ED Triage Notes (Signed)
Pt presents ambulatory to ED with c/o of right flank pain and clots of blood. Pt was recently seen for ovarian torsion and had her left ovary removed. Pt is restless at this time in triage.

## 2018-04-28 ENCOUNTER — Observation Stay: Payer: Medicaid Other

## 2018-04-28 ENCOUNTER — Encounter: Payer: Self-pay | Admitting: Radiology

## 2018-04-28 ENCOUNTER — Emergency Department: Payer: Medicaid Other

## 2018-04-28 ENCOUNTER — Ambulatory Visit: Payer: Medicaid Other | Admitting: Obstetrics and Gynecology

## 2018-04-28 DIAGNOSIS — M549 Dorsalgia, unspecified: Secondary | ICD-10-CM | POA: Diagnosis present

## 2018-04-28 LAB — CBC WITH DIFFERENTIAL/PLATELET
BASOS ABS: 0 10*3/uL (ref 0–0.1)
BASOS PCT: 0 %
Eosinophils Absolute: 0.2 10*3/uL (ref 0–0.7)
Eosinophils Relative: 1 %
HCT: 38.6 % (ref 35.0–47.0)
Hemoglobin: 13.3 g/dL (ref 12.0–16.0)
Lymphocytes Relative: 26 %
Lymphs Abs: 2.9 10*3/uL (ref 1.0–3.6)
MCH: 31.8 pg (ref 26.0–34.0)
MCHC: 34.6 g/dL (ref 32.0–36.0)
MCV: 92 fL (ref 80.0–100.0)
MONO ABS: 1.1 10*3/uL — AB (ref 0.2–0.9)
Monocytes Relative: 10 %
Neutro Abs: 7.1 10*3/uL — ABNORMAL HIGH (ref 1.4–6.5)
Neutrophils Relative %: 63 %
Platelets: 181 10*3/uL (ref 150–440)
RBC: 4.19 MIL/uL (ref 3.80–5.20)
RDW: 12.9 % (ref 11.5–14.5)
WBC: 11.3 10*3/uL — ABNORMAL HIGH (ref 3.6–11.0)

## 2018-04-28 LAB — COMPREHENSIVE METABOLIC PANEL
ALT: 42 U/L (ref 0–44)
ANION GAP: 8 (ref 5–15)
AST: 35 U/L (ref 15–41)
Albumin: 3.6 g/dL (ref 3.5–5.0)
Alkaline Phosphatase: 88 U/L (ref 38–126)
BUN: 15 mg/dL (ref 6–20)
CHLORIDE: 107 mmol/L (ref 98–111)
CO2: 25 mmol/L (ref 22–32)
Calcium: 8.9 mg/dL (ref 8.9–10.3)
Creatinine, Ser: 0.7 mg/dL (ref 0.44–1.00)
GFR calc Af Amer: >60 mL/min (ref >60)
Glucose, Bld: 115 mg/dL — ABNORMAL HIGH (ref 70–99)
Potassium: 3.7 mmol/L (ref 3.5–5.1)
Sodium: 140 mmol/L (ref 135–145)
Total Bilirubin: 0.5 mg/dL (ref 0.3–1.2)
Total Protein: 7.3 g/dL (ref 6.5–8.1)

## 2018-04-28 LAB — HCG, QUANTITATIVE, PREGNANCY

## 2018-04-28 MED ORDER — FENTANYL CITRATE (PF) 100 MCG/2ML IJ SOLN
100.0000 ug | Freq: Once | INTRAMUSCULAR | Status: AC
  Administered 2018-04-28: 100 ug via INTRAVENOUS
  Filled 2018-04-28: qty 2

## 2018-04-28 MED ORDER — SENNOSIDES-DOCUSATE SODIUM 8.6-50 MG PO TABS: 1.0000 | ORAL_TABLET | Freq: Every evening | ORAL | Status: DC | PRN

## 2018-04-28 MED ORDER — KETOROLAC TROMETHAMINE 30 MG/ML IJ SOLN
15.0000 mg | Freq: Once | INTRAMUSCULAR | Status: AC
  Administered 2018-04-28: 15 mg via INTRAVENOUS
  Filled 2018-04-28: qty 1

## 2018-04-28 MED ORDER — BISACODYL 5 MG PO TBEC
5.0000 mg | DELAYED_RELEASE_TABLET | Freq: Every day | ORAL | Status: DC | PRN
Start: 2018-04-28 — End: 2018-04-28

## 2018-04-28 MED ORDER — ONDANSETRON HCL 4 MG PO TABS: 4.0000 mg | ORAL_TABLET | Freq: Four times a day (QID) | ORAL | Status: DC | PRN

## 2018-04-28 MED ORDER — ONDANSETRON HCL 4 MG/2ML IJ SOLN: 4.0000 mg | Freq: Four times a day (QID) | INTRAMUSCULAR | Status: DC | PRN

## 2018-04-28 MED ORDER — HYDROMORPHONE HCL 1 MG/ML IJ SOLN: 1.0000 mg | INTRAMUSCULAR | Status: DC | PRN

## 2018-04-28 MED ORDER — ACETAMINOPHEN 650 MG RE SUPP: 650.0000 mg | Freq: Four times a day (QID) | RECTAL | Status: DC | PRN

## 2018-04-28 MED ORDER — HYDROMORPHONE HCL 1 MG/ML IJ SOLN
1.0000 mg | INTRAMUSCULAR | Status: DC | PRN
Start: 2018-04-28 — End: 2018-04-28
  Administered 2018-04-28 (×2): 1 mg via INTRAVENOUS
  Filled 2018-04-28 (×2): qty 1

## 2018-04-28 MED ORDER — ENOXAPARIN SODIUM 40 MG/0.4ML ~~LOC~~ SOLN
40.0000 mg | SUBCUTANEOUS | Status: DC
  Administered 2018-04-28: 40 mg via SUBCUTANEOUS
  Filled 2018-04-28: qty 0.4

## 2018-04-28 MED ORDER — HALOPERIDOL LACTATE 5 MG/ML IJ SOLN
5.0000 mg | Freq: Once | INTRAMUSCULAR | Status: AC
Start: 2018-04-28 — End: 2018-04-28
  Administered 2018-04-28: 5 mg via INTRAVENOUS
  Filled 2018-04-28: qty 1

## 2018-04-28 MED ORDER — LACTATED RINGERS IV SOLN
INTRAVENOUS | Status: DC
  Administered 2018-04-28: 125 mL/h via INTRAVENOUS

## 2018-04-28 MED ORDER — IOPAMIDOL (ISOVUE-300) INJECTION 61%
125.0000 mL | Freq: Once | INTRAVENOUS | Status: AC | PRN
  Administered 2018-04-28: 125 mL via INTRAVENOUS

## 2018-04-28 MED ORDER — KETOROLAC TROMETHAMINE 30 MG/ML IJ SOLN
30.0000 mg | Freq: Three times a day (TID) | INTRAMUSCULAR | Status: DC | PRN
  Administered 2018-04-28: 30 mg via INTRAVENOUS
  Filled 2018-04-28: qty 1

## 2018-04-28 MED ORDER — OXYCODONE-ACETAMINOPHEN 5-325 MG PO TABS: 1.0000 | ORAL_TABLET | ORAL | 0 refills | Status: DC | PRN

## 2018-04-28 MED ORDER — ACETAMINOPHEN 325 MG PO TABS: 650.0000 mg | ORAL_TABLET | Freq: Four times a day (QID) | ORAL | Status: DC | PRN

## 2018-04-28 MED ORDER — OXYCODONE-ACETAMINOPHEN 5-325 MG PO TABS
1.0000 | ORAL_TABLET | ORAL | Status: DC | PRN
  Administered 2018-04-28: 2 via ORAL
  Administered 2018-04-28: 1 via ORAL
  Filled 2018-04-28: qty 1
  Filled 2018-04-28: qty 2

## 2018-04-28 NOTE — ED Notes (Signed)
Pt returned from MRI without complete scan d/t 1.pt inablilty to tolerate, 2. Radiologist states machine is not compatible with pt size for scan to be completed. Admitting MD aware, states need for imaging remains

## 2018-04-28 NOTE — ED Notes (Signed)
Report to April rn

## 2018-04-28 NOTE — Progress Notes (Signed)
This nurse received called from MRI. Patient refuses to get test done due to claustrophobia. MD Mayo paged and made aware.

## 2018-04-28 NOTE — ED Provider Notes (Signed)
[facility] Emergency Department Provider Note  ____________________________________________   None    (approximate)  I have reviewed the triage vital signs and the nursing notes.   HISTORY  Chief Complaint Flank Pain and Vaginal Bleeding    HPI Kristi Gutierrez is a 37 y.o. female who comes to the emergency department with several weeks of right flank pain radiating down towards her right groin.  I actually saw the patient 2 weeks ago and diagnosed her with a left-sided ovarian cyst and ovarian torsion.  She was taken to the operating room and postoperatively initially her pain was improved however the next day it returned.  She went to [facility] where she had a postoperative CT scan which was unremarkable.  Her pain was never adequately controlled and she went home.  Her pain is now severe.  It radiates from the right flank towards her right groin.  It is constant.  Worse with movement.  No bowel or bladder incontinence or hesitance.  She is able to ambulate.  No fevers or chills.  No dysuria frequency or hesitancy.  Over-the-counter pain medication has not adequately control her pain so she comes back to our hospital for another opinion.    Past Medical History:  Diagnosis Date  . Migraine    currently active, uses Excedrin  . Pseudotumor cerebri     Patient Active Problem List   Diagnosis Date Noted  . Intractable back pain 04/28/2018  . Admission for sterilization 03/01/2015    Past Surgical History:  Procedure Laterality Date  . ATTEMPTED BTL-HAD TO ABORT PROCEDURE  10-2014  . CYST EXCISION    . EXCISION MASS UPPER EXTREMETIES Right 03/14/2018   Procedure: EXCISION ELBOW MASS;  Surgeon: Leanor Kail, MD;  Location: [facility] ORS;  Service: Orthopedics;  Laterality: Right;  . GANGLION CYST EXCISION Left 12/26/2015   Procedure: REMOVAL GANGLION OF WRIST;  Surgeon: Earnestine Leys, MD;  Location: [facility] ORS;  Service: Orthopedics;  Laterality:  Left;  . GANGLION CYST EXCISION Left 04/20/2016   Procedure: REMOVAL GANGLION OF WRIST;  Surgeon: Leanor Kail, MD;  Location: [facility] ORS;  Service: Orthopedics;  Laterality: Left;  . LAPAROSCOPIC OVARIAN CYSTECTOMY Left 04/14/2018   Procedure: LAPAROSCOPIC OVARIAN CYSTECTOMY;  Surgeon: Homero Fellers, MD;  Location: [facility] ORS;  Service: Gynecology;  Laterality: Left;  peritubal cyst  . LAPAROSCOPIC TUBAL LIGATION Bilateral 03/01/2015   Procedure: LAPAROSCOPIC TUBAL LIGATION;  Surgeon: Will Bonnet, MD;  Location: [facility] ORS;  Service: Gynecology;  Laterality: Bilateral;  . LAPAROSCOPIC UNILATERAL SALPINGECTOMY Left 04/14/2018   Procedure: LAPAROSCOPIC UNILATERAL SALPINGECTOMY;  Surgeon: Homero Fellers, MD;  Location: [facility] ORS;  Service: Gynecology;  Laterality: Left;  . MOUTH SURGERY    . TUBAL LIGATION      Prior to Admission medications   Medication Sig Start Date End Date Taking? Authorizing Provider  acetaminophen (TYLENOL) 500 MG tablet Take 2 tablets (1,000 mg total) by mouth every 6 (six) hours as needed. 04/14/18 04/14/19 Yes Schuman, Christanna R, MD  ibuprofen (ADVIL,MOTRIN) 800 MG tablet Take 1 tablet (800 mg total) by mouth every 8 (eight) hours as needed. 04/14/18  Yes Schuman, Stefanie Libel, MD  oxyCODONE-acetaminophen (PERCOCET/ROXICET) 5-325 MG tablet Take 1-2 tablets by mouth every 4 (four) hours as needed for moderate pain or severe pain. 04/28/18   Mayo, Pete Pelt, MD    Allergies Patient has no known allergies.  Family History  Problem Relation Age of Onset  . Breast cancer Sister 56  pat 1/2 sister    Social History Social History   Tobacco Use  . Smoking status: Never Smoker  . Smokeless tobacco: Never Used  Substance Use Topics  . Alcohol use: Yes    Comment: occ  . Drug use: No    Review of Systems Constitutional: No fever/chills Eyes: No visual changes. ENT: No sore throat. Cardiovascular: Denies chest pain. Respiratory: Denies  shortness of breath. Gastrointestinal: Positive for abdominal pain.  No nausea, no vomiting.  No diarrhea.  No constipation. Genitourinary: Negative for dysuria. Musculoskeletal: Positive for back pain. Skin: Negative for rash. Neurological: Negative for headaches, focal weakness or numbness.   ____________________________________________   PHYSICAL EXAM:  VITAL SIGNS: ED Triage Vitals  Enc Vitals Group     BP 04/27/18 2221 (!) 150/89     Pulse Rate 04/27/18 2221 94     Resp 04/27/18 2221 16     Temp 04/27/18 2221 98.2 F (36.8 C)     Temp Source 04/27/18 2221 Oral     SpO2 04/27/18 2221 99 %     Weight 04/27/18 2222 257 lb (116.6 kg)     Height 04/27/18 2222 5' 6.5" (1.689 m)     Head Circumference --      Peak Flow --      Pain Score 04/27/18 2222 10     Pain Loc --      Pain Edu? --      Excl. in [place]? --     Constitutional: Alert and oriented x4 crying and appears extremely uncomfortable Eyes: PERRL EOMI. Head: Atraumatic. Nose: No congestion/rhinnorhea. Mouth/Throat: No trismus Neck: No stridor.   Cardiovascular: Normal rate, regular rhythm. Grossly normal heart sounds.  Good peripheral circulation. Respiratory: Normal respiratory effort.  No retractions. Lungs CTAB and moving good air Gastrointestinal: Obese abdomen soft nontender Musculoskeletal: No lower extremity edema   Neurologic:  Normal speech and language. No gross focal neurologic deficits are appreciated. Skin:  Skin is warm, dry and intact. No rash noted. Psychiatric: Mood and affect are normal. Speech and behavior are normal.    ____________________________________________   DIFFERENTIAL includes but not limited to  Radicular pain, cauda equina syndrome, postoperative infection, postoperative abscess ____________________________________________   LABS (all labs ordered are listed, but only abnormal results are displayed)  Labs Reviewed  CBC WITH DIFFERENTIAL/PLATELET - Abnormal; Notable for  the following components:      Result Value   WBC 11.3 (*)    Neutro Abs 7.1 (*)    Monocytes Absolute 1.1 (*)    All other components within normal limits  COMPREHENSIVE METABOLIC PANEL - Abnormal; Notable for the following components:   Glucose, Bld 115 (*)    All other components within normal limits  HCG, QUANTITATIVE, PREGNANCY  HIV ANTIBODY (ROUTINE TESTING)    Work reviewed by me with no acute disease __________________________________________  EKG   ____________________________________________  RADIOLOGY  CT abdomen pelvis reviewed by me with no etiology for symptoms identified ____________________________________________   PROCEDURES  Procedure(s) performed: no  Procedures  Critical Care performed: no  ____________________________________________   INITIAL IMPRESSION / ASSESSMENT AND PLAN / ED COURSE  Pertinent labs & imaging results that were available during my care of the patient were reviewed by me and considered in my medical decision making (see chart for details).   As part of my medical decision making, I reviewed the following data within the [place] History obtained from family if available, nursing notes, old chart and ekg, as  well as notes from prior ED visits.      ----------------------------------------- 3:00 AM on 04/28/2018 -----------------------------------------  Fortunately the patient's CT scan is reassuring.  She persists with severe pain that is unrelieved with multiple doses of IV opioids.  At this point the patient has failed outpatient management and is unable to function in her everyday life secondary to this persistent pain.  She requires inpatient admission for continued IV pain medication and further investigation into the etiology of her severe symptoms.  It is possible that she could have musculoskeletal or spinal etiology of her symptoms.  I discussed with hospitalist who has graciously agreed to admit  the patient to his service.  ____________________________________________   FINAL CLINICAL IMPRESSION(S) / ED DIAGNOSES  Final diagnoses:  Flank pain      NEW MEDICATIONS STARTED DURING THIS VISIT:  Discharge Medication List as of 04/28/2018  2:46 PM    START taking these medications   Details  oxyCODONE-acetaminophen (PERCOCET/ROXICET) 5-325 MG tablet Take 1-2 tablets by mouth every 4 (four) hours as needed for moderate pain or severe pain., Starting Mon 04/28/2018, Print         Note:  This document was prepared using Dragon voice recognition software and may include unintentional dictation errors.     Darel Hong, MD 05/01/18 1019

## 2018-04-28 NOTE — H&P (Signed)
[place] [facility] [place] NAME: Kristi Gutierrez    MR#:  [id number]  DATE OF BIRTH:  03-Nov-1980  DATE OF ADMISSION:  04/27/2018  PRIMARY CARE PHYSICIAN: Lorelee Market, MD   REQUESTING/REFERRING PHYSICIAN: Darel Hong, MD  CHIEF COMPLAINT:   Chief Complaint  Patient presents with  . Flank Pain  . Vaginal Bleeding    HISTORY OF PRESENT ILLNESS:  Kristi Gutierrez  is a 37 y.o. female with a known history of recent ovarian torsion (s/p surgery) who p/w back pain. Pt presented to ED w/ back pain ~2wks ago, found to have ovarian torsion. She underwent surgery, but continues to have persistent back pain. She endorses R-sided thoracic/lumbar back pain radiating around the R side of the abdomen to the anterior in a bandlike distribution. She states the pain is worse w/ position change, deep inspiration, coughing and sneezing. She states she has been spending most of her time in bed 2/2 pain, and pain has been impeding her daily activities, including the care of her young child. She denies incontinence or groin numbness. She is in mild distress at the time of my assessment. She does not exhibit any neurological deficits.  PAST MEDICAL HISTORY:   Past Medical History:  Diagnosis Date  . Migraine    currently active, uses Excedrin  . Pseudotumor cerebri     PAST SURGICAL HISTORY:   Past Surgical History:  Procedure Laterality Date  . ATTEMPTED BTL-HAD TO ABORT PROCEDURE  10-2014  . CYST EXCISION    . EXCISION MASS UPPER EXTREMETIES Right 03/14/2018   Procedure: EXCISION ELBOW MASS;  Surgeon: Leanor Kail, MD;  Location: [facility] ORS;  Service: Orthopedics;  Laterality: Right;  . GANGLION CYST EXCISION Left 12/26/2015   Procedure: REMOVAL GANGLION OF WRIST;  Surgeon: Earnestine Leys, MD;  Location: [facility] OR[facility];  Service: Orthopedics;  Laterality: Left;  . GANGLION CYST EXCISION Left 04/20/2016   Procedure: REMOVAL GANGLION OF WRIST;  Surgeon: Leanor Kail, MD;  Location: [facility] OR[facility];  Service: Orthopedics;  Laterality: Left;  . LAPAROSCOPIC OVARIAN CYSTECTOMY Left 04/14/2018   Procedure: LAPAROSCOPIC OVARIAN CYSTECTOMY;  Surgeon: Homero Fellers, MD;  Location: [facility] ORS;  Service: Gynecology;  Laterality: Left;  peritubal cyst  . LAPAROSCOPIC TUBAL LIGATION Bilateral 03/01/2015   Procedure: LAPAROSCOPIC TUBAL LIGATION;  Surgeon: Will Bonnet, MD;  Location: [facility] ORS;  Service: Gynecology;  Laterality: Bilateral;  . LAPAROSCOPIC UNILATERAL SALPINGECTOMY Left 04/14/2018   Procedure: LAPAROSCOPIC UNILATERAL SALPINGECTOMY;  Surgeon: Homero Fellers, MD;  Location: [facility] ORS;  Service: Gynecology;  Laterality: Left;  . MOUTH SURGERY    . TUBAL LIGATION      SOCIAL HISTORY:   Social History   Tobacco Use  . Smoking status: Never Smoker  . Smokeless tobacco: Never Used  Substance Use Topics  . Alcohol use: Yes    Comment: occ    FAMILY HISTORY:   Family History  Problem Relation Age of Onset  . Breast cancer Sister 42       pat 1/2 sister    DRUG ALLERGIES:  No Known Allergies  REVIEW OF SYSTEMS:   Review of Systems  Constitutional: Negative for chills, diaphoresis, fever, malaise/fatigue and weight loss.  HENT: Negative for congestion, ear pain, hearing loss, nosebleeds, sinus pain, sore throat and tinnitus.   Eyes: Negative for blurred vision, double vision and photophobia.  Respiratory: Negative for cough, hemoptysis, sputum production, shortness of breath and wheezing.   Cardiovascular: Negative for chest pain,  palpitations, orthopnea, claudication, leg swelling and PND.  Gastrointestinal: Negative for abdominal pain, blood in stool, constipation, diarrhea, heartburn, melena, nausea and vomiting.  Genitourinary: Negative for dysuria, frequency, hematuria and urgency.  Musculoskeletal: Positive for back pain. Negative for falls, joint pain, myalgias and neck pain.  Skin: Negative for itching and rash.    Neurological: Negative for dizziness, tingling, tremors, sensory change, speech change, focal weakness, seizures, loss of consciousness, weakness and headaches.  Psychiatric/Behavioral: Negative for memory loss. The patient does not have insomnia.    MEDICATIONS AT HOME:   Prior to Admission medications   Medication Sig Start Date End Date Taking? Authorizing Provider  acetaminophen (TYLENOL) 500 MG tablet Take 2 tablets (1,000 mg total) by mouth every 6 (six) hours as needed. 04/14/18 04/14/19 Yes Schuman, Christanna R, MD  ibuprofen (ADVIL,MOTRIN) 800 MG tablet Take 1 tablet (800 mg total) by mouth every 8 (eight) hours as needed. 04/14/18  Yes Schuman, Christanna R, MD      VITAL SIGNS:  Blood pressure 122/66, pulse 83, temperature 97.7 F (36.5 C), temperature source Oral, resp. rate 20, height 5' 6.5" (1.689 m), weight 116.6 kg (257 lb), SpO2 98 %.  PHYSICAL EXAMINATION:  Physical Exam  Constitutional: She is oriented to person, place, and time. She appears well-developed and well-nourished. She is active and cooperative.  Non-toxic appearance. She does not have a sickly appearance. She does not appear ill. She appears distressed. She is not intubated.  HENT:  Head: Normocephalic and atraumatic.  Mouth/Throat: Oropharynx is clear and moist. No oropharyngeal exudate.  Eyes: Conjunctivae, EOM and lids are normal. No scleral icterus.  Neck: Neck supple. No JVD present. No thyromegaly present.  Cardiovascular: Normal rate, regular rhythm, S1 normal, S2 normal and normal heart sounds.  No extrasystoles are present. Exam reveals no gallop, no S3, no S4, no distant heart sounds and no friction rub.  No murmur heard. Pulmonary/Chest: Effort normal and breath sounds normal. No accessory muscle usage or stridor. No apnea, no tachypnea and no bradypnea. She is not intubated. No respiratory distress. She has no decreased breath sounds. She has no wheezes. She has no rhonchi. She has no rales.   Abdominal: Soft. Bowel sounds are normal. She exhibits no distension. There is tenderness in the right upper quadrant and right lower quadrant. There is no rebound and no guarding.  Musculoskeletal: Normal range of motion. She exhibits no edema or tenderness.  Lymphadenopathy:    She has no cervical adenopathy.  Neurological: She is alert and oriented to person, place, and time. She is not disoriented.  Skin: Skin is warm, dry and intact. No rash noted. She is not diaphoretic. No erythema.  Psychiatric: She has a normal mood and affect. Her speech is normal and behavior is normal. Judgment and thought content normal. Cognition and memory are normal.   LABORATORY PANEL:   CBC Recent Labs  Lab 04/28/18 0018  WBC 11.3*  HGB 13.3  HCT 38.6  PLT 181   ------------------------------------------------------------------------------------------------------------------  Chemistries  Recent Labs  Lab 04/28/18 0018  NA 140  K 3.7  CL 107  CO2 25  GLUCOSE 115*  BUN 15  CREATININE 0.70  CALCIUM 8.9  AST 35  ALT 42  ALKPHOS 88  BILITOT 0.5   ------------------------------------------------------------------------------------------------------------------  Cardiac Enzymes No results for input(s): TROPONINI in the last 168 hours. ------------------------------------------------------------------------------------------------------------------  RADIOLOGY:  US Pelvis Transvanginal Non-ob (tv Only)  Result Date: 04/27/2018 CLINICAL DATA:  Acute onset of right flank pain after recent left  paratubal cystectomy and salpingectomy. EXAM: TRANSABDOMINAL AND TRANSVAGINAL ULTRASOUND OF PELVIS TECHNIQUE: Both transabdominal and transvaginal ultrasound examinations of the pelvis were performed. Transabdominal technique was performed for global imaging of the pelvis including uterus, ovaries, adnexal regions, and pelvic cul-de-sac. It was necessary to proceed with endovaginal exam following the  transabdominal exam to visualize the uterus and ovaries in greater detail. COMPARISON:  CT of the abdomen and pelvis performed 04/15/2018, and pelvic ultrasound performed 04/14/2018 FINDINGS: Uterus Measurements: 9.6 x 5.1 x 4.0 cm. No fibroids or other mass visualized. Endometrium Thickness: 1.3 cm.  No focal abnormality visualized. Right ovary Measurements: 3.6 x 1.6 x 2.4 cm. Normal appearance/no adnexal mass. Left ovary Measurements: 2.9 x 1.8 x 1.9 cm. Normal appearance/no adnexal mass. Other findings No abnormal free fluid. IMPRESSION: Unremarkable pelvic ultrasound. Electronically Signed   By: Garald Balding M.D.   On: 04/27/2018 23:39   US Pelvis Complete  Result Date: 04/27/2018 CLINICAL DATA:  Acute onset of right flank pain after recent left paratubal cystectomy and salpingectomy. EXAM: TRANSABDOMINAL AND TRANSVAGINAL ULTRASOUND OF PELVIS TECHNIQUE: Both transabdominal and transvaginal ultrasound examinations of the pelvis were performed. Transabdominal technique was performed for global imaging of the pelvis including uterus, ovaries, adnexal regions, and pelvic cul-de-sac. It was necessary to proceed with endovaginal exam following the transabdominal exam to visualize the uterus and ovaries in greater detail. COMPARISON:  CT of the abdomen and pelvis performed 04/15/2018, and pelvic ultrasound performed 04/14/2018 FINDINGS: Uterus Measurements: 9.6 x 5.1 x 4.0 cm. No fibroids or other mass visualized. Endometrium Thickness: 1.3 cm.  No focal abnormality visualized. Right ovary Measurements: 3.6 x 1.6 x 2.4 cm. Normal appearance/no adnexal mass. Left ovary Measurements: 2.9 x 1.8 x 1.9 cm. Normal appearance/no adnexal mass. Other findings No abnormal free fluid. IMPRESSION: Unremarkable pelvic ultrasound. Electronically Signed   By: Garald Balding M.D.   On: 04/27/2018 23:39   Ct Abdomen Pelvis W Contrast  Result Date: 04/28/2018 CLINICAL DATA:  Acute onset of right flank and right lower quadrant  abdominal pain. EXAM: CT ABDOMEN AND PELVIS WITH CONTRAST TECHNIQUE: Multidetector CT imaging of the abdomen and pelvis was performed using the standard protocol following bolus administration of intravenous contrast. CONTRAST:  129mL ISOVUE-300 IOPAMIDOL (ISOVUE-300) INJECTION 61% COMPARISON:  CT of the abdomen and pelvis performed 04/15/2018 FINDINGS: Lower chest: The visualized lung bases are grossly clear. The visualized portions of the mediastinum are unremarkable. Hepatobiliary: The liver is unremarkable in appearance. The gallbladder is unremarkable in appearance. The common bile duct remains normal in caliber. Pancreas: The pancreas is within normal limits. Spleen: The spleen is unremarkable in appearance. Adrenals/Urinary Tract: The adrenal glands are unremarkable in appearance. The kidneys are within normal limits. There is no evidence of hydronephrosis. No renal or ureteral stones are identified. No perinephric stranding is seen. Stomach/Bowel: The stomach is unremarkable in appearance. The small bowel is within normal limits. The appendix is normal in caliber, without evidence of appendicitis. The colon is unremarkable in appearance. Vascular/Lymphatic: The abdominal aorta is unremarkable in appearance. The inferior vena cava is grossly unremarkable. No retroperitoneal lymphadenopathy is seen. No pelvic sidewall lymphadenopathy is identified. Reproductive: The bladder is decompressed and not well characterized. The uterus is unremarkable in appearance. The patient is status post tubal ligation on the right. The patient is status post left-sided oophorectomy and salpingectomy. A residual cystic structure at the left adnexa may be postoperative in nature. Other: No additional soft tissue abnormalities are seen. Postoperative soft tissue stranding is noted at  the umbilicus. Musculoskeletal: No acute osseous abnormalities are identified. The visualized musculature is unremarkable in appearance. IMPRESSION:  No acute abnormality seen within the abdomen or pelvis. Electronically Signed   By: Garald Balding M.D.   On: 04/28/2018 02:30   IMPRESSION AND PLAN:   A/P: 68F intractable back pain. -Med/Surg -Symptomatic mgmt, pain ctrl -MRI T-spine + L-spine -Suspect disc herniation w/ nerve root compression/radiculopathy -Regular diet -Lovenox -Full code -Observation, < 2 midnights   All the records are reviewed and case discussed with ED provider. Management plans discussed with the patient, family and they are in agreement.  CODE STATUS: Full code  TOTAL TIME TAKING CARE OF THIS PATIENT: 45 minutes.    Arta Silence M.D on 04/28/2018 at 7:21 AM  Between 7am to 6pm - Pager - [phone]  After 6pm go to [email or web address] - Proofreader  [facility]  Office  [phone]  CC: Primary care physician; Lorelee Market, MD   Note: This dictation was prepared with Dragon dictation along with smaller phrase technology. Any transcriptional errors that result from this process are unintentional.

## 2018-04-28 NOTE — ED Notes (Signed)
Patient transported to MRI 

## 2018-04-28 NOTE — Progress Notes (Signed)
Discharge instructions and medication details reviewed with patient. Patient verbalizes understanding. Printed prescription for percocet and AVS given to patient. IV removed. Patient escorted out via wheelchair.

## 2018-04-28 NOTE — Discharge Summary (Signed)
[facility] [place] NAME: Kristi Gutierrez    MR#:  [id number]  DATE OF BIRTH:  31-May-1981  DATE OF ADMISSION:  04/27/2018   ADMITTING PHYSICIAN: Arta Silence, MD  DATE OF DISCHARGE: 04/28/18  PRIMARY CARE PHYSICIAN: Lorelee Market, MD   ADMISSION DIAGNOSIS:  Flank pain [R10.9] Pelvic pain [R10.2] DISCHARGE DIAGNOSIS:  Active Problems:   Intractable back pain  SECONDARY DIAGNOSIS:   Past Medical History:  Diagnosis Date  . Migraine    currently active, uses Excedrin  . Pseudotumor cerebri    HOSPITAL COURSE:   Kristi Gutierrez is a 37 year old female with a PMH of migraines and pseudotumor cerebri presenting to the ED with right sided thoracic/lumbar back pain that radiated down her right leg. She recently underwent surgery for ovarian cyst and torsion ~2 weeks ago, and has been more sedentary since then. Her back pain was unable to be controlled in the ED, so she was admitted for intractable back pain. She was treated with dilaudid and percocet and her back pain greatly improved. MRI thoracic/lumbar spine was ordered, but patient was unable to tolerate the MRI machine. She declined the MRI even when medications were offered. She will obtain the MRI as an outpatient.   DISCHARGE CONDITIONS:  Stable, improved CONSULTS OBTAINED:  Treatment Team:  Arta Silence, MD DRUG ALLERGIES:  No Known Allergies DISCHARGE MEDICATIONS:   Allergies as of 04/28/2018   No Known Allergies     Medication List    TAKE these medications   acetaminophen 500 MG tablet Commonly known as:  TYLENOL Take 2 tablets (1,000 mg total) by mouth every 6 (six) hours as needed.   ibuprofen 800 MG tablet Commonly known as:  ADVIL,MOTRIN Take 1 tablet (800 mg total) by mouth every 8 (eight) hours as needed.   oxyCODONE-acetaminophen 5-325 MG tablet Commonly known as:  PERCOCET/ROXICET Take 1-2 tablets by mouth every 4 (four) hours as needed for  moderate pain or severe pain.        DISCHARGE INSTRUCTIONS:  1. F/u with PCP in 1-2 weeks 2. Patient unable to tolerate MRI here, please schedule MRI thoracic/lumbar spine in open MRI machine DIET:  Regular diet DISCHARGE CONDITION:  Good ACTIVITY:  Activity as tolerated OXYGEN:  Home Oxygen: No.  Oxygen Delivery: room air DISCHARGE LOCATION:  home   If you experience worsening of your admission symptoms, develop shortness of breath, life threatening emergency, suicidal or homicidal thoughts you must seek medical attention immediately by calling 911 or calling your MD immediately  if symptoms less severe.  You Must read complete instructions/literature along with all the possible adverse reactions/side effects for all the Medicines you take and that have been prescribed to you. Take any new Medicines after you have completely understood and accpet all the possible adverse reactions/side effects.   Please note  You were cared for by a hospitalist during your hospital stay. If you have any questions about your discharge medications or the care you received while you were in the hospital after you are discharged, you can call the unit and asked to speak with the hospitalist on call if the hospitalist that took care of you is not available. Once you are discharged, your primary care physician will handle any further medical issues. Please note that NO REFILLS for any discharge medications will be authorized once you are discharged, as it is imperative that you return to your primary care physician (or establish a relationship with  a primary care physician if you do not have one) for your aftercare needs so that they can reassess your need for medications and monitor your lab values.    On the day of Discharge:  VITAL SIGNS:  Blood pressure 101/66, pulse 72, temperature 98 F (36.7 C), temperature source Oral, resp. rate 20, height 5' 6.5" (1.689 m), weight 116.6 kg (257 lb), SpO2 100  %. PHYSICAL EXAMINATION:  GENERAL:  37 y.o.-year-old patient lying in the bed with no acute distress.  EYES: Pupils equal, round, reactive to light and accommodation. No scleral icterus. Extraocular muscles intact.  HEENT: Head atraumatic, normocephalic. Oropharynx and nasopharynx clear.  NECK:  Supple, no jugular venous distention. No thyroid enlargement, no tenderness.  LUNGS: Normal breath sounds bilaterally, no wheezing, rales,rhonchi or crepitation. No use of accessory muscles of respiration.  CARDIOVASCULAR: S1, S2 normal. No murmurs, rubs, or gallops.  ABDOMEN: Soft, non-tender, non-distended. Bowel sounds present. No organomegaly or mass.  EXTREMITIES: No pedal edema, cyanosis, or clubbing.  NEUROLOGIC: Cranial nerves II through XII are intact. Muscle strength 5/5 in all extremities. Sensation intact. Gait not checked.  PSYCHIATRIC: The patient is alert and oriented x 3.  SKIN: No obvious rash, lesion, or ulcer.  DATA REVIEW:   CBC Recent Labs  Lab 04/28/18 0018  WBC 11.3*  HGB 13.3  HCT 38.6  PLT 181    Chemistries  Recent Labs  Lab 04/28/18 0018  NA 140  K 3.7  CL 107  CO2 25  GLUCOSE 115*  BUN 15  CREATININE 0.70  CALCIUM 8.9  AST 35  ALT 42  ALKPHOS 88  BILITOT 0.5     Microbiology Results  Results for orders placed or performed during the hospital encounter of 04/09/17  Wet prep, genital     Status: Abnormal   Collection Time: 04/09/17  6:25 AM  Result Value Ref Range Status   Yeast Wet Prep HPF POC NONE SEEN NONE SEEN Final   Trich, Wet Prep NONE SEEN NONE SEEN Final   Clue Cells Wet Prep HPF POC PRESENT (A) NONE SEEN Final   WBC, Wet Prep HPF POC MANY (A) NONE SEEN Final   Sperm NONE SEEN  Final  Urine culture     Status: Abnormal   Collection Time: 04/09/17  7:18 AM  Result Value Ref Range Status   Specimen Description URINE, CLEAN CATCH  Final   Special Requests NONE  Final   Culture >=100,000 COLONIES/mL ESCHERICHIA COLI (A)  Final    Report Status 04/11/2017 FINAL  Final   Organism ID, Bacteria ESCHERICHIA COLI (A)  Final      Susceptibility   Escherichia coli - MIC*    AMPICILLIN <=2 SENSITIVE Sensitive     CEFAZOLIN <=4 SENSITIVE Sensitive     CEFTRIAXONE <=1 SENSITIVE Sensitive     CIPROFLOXACIN <=0.25 SENSITIVE Sensitive     GENTAMICIN <=1 SENSITIVE Sensitive     IMIPENEM <=0.25 SENSITIVE Sensitive     NITROFURANTOIN <=16 SENSITIVE Sensitive     TRIMETH/SULFA <=20 SENSITIVE Sensitive     AMPICILLIN/SULBACTAM <=2 SENSITIVE Sensitive     PIP/TAZO <=4 SENSITIVE Sensitive     Extended ESBL NEGATIVE Sensitive     * >=100,000 COLONIES/mL ESCHERICHIA COLI    RADIOLOGY:  US Pelvis Transvanginal Non-ob (tv Only)  Result Date: 04/27/2018 CLINICAL DATA:  Acute onset of right flank pain after recent left paratubal cystectomy and salpingectomy. EXAM: TRANSABDOMINAL AND TRANSVAGINAL ULTRASOUND OF PELVIS TECHNIQUE: Both transabdominal and transvaginal ultrasound examinations of  the pelvis were performed. Transabdominal technique was performed for global imaging of the pelvis including uterus, ovaries, adnexal regions, and pelvic cul-de-sac. It was necessary to proceed with endovaginal exam following the transabdominal exam to visualize the uterus and ovaries in greater detail. COMPARISON:  CT of the abdomen and pelvis performed 04/15/2018, and pelvic ultrasound performed 04/14/2018 FINDINGS: Uterus Measurements: 9.6 x 5.1 x 4.0 cm. No fibroids or other mass visualized. Endometrium Thickness: 1.3 cm.  No focal abnormality visualized. Right ovary Measurements: 3.6 x 1.6 x 2.4 cm. Normal appearance/no adnexal mass. Left ovary Measurements: 2.9 x 1.8 x 1.9 cm. Normal appearance/no adnexal mass. Other findings No abnormal free fluid. IMPRESSION: Unremarkable pelvic ultrasound. Electronically Signed   By: Garald Balding M.D.   On: 04/27/2018 23:39   US Pelvis Complete  Result Date: 04/27/2018 CLINICAL DATA:  Acute onset of right flank pain  after recent left paratubal cystectomy and salpingectomy. EXAM: TRANSABDOMINAL AND TRANSVAGINAL ULTRASOUND OF PELVIS TECHNIQUE: Both transabdominal and transvaginal ultrasound examinations of the pelvis were performed. Transabdominal technique was performed for global imaging of the pelvis including uterus, ovaries, adnexal regions, and pelvic cul-de-sac. It was necessary to proceed with endovaginal exam following the transabdominal exam to visualize the uterus and ovaries in greater detail. COMPARISON:  CT of the abdomen and pelvis performed 04/15/2018, and pelvic ultrasound performed 04/14/2018 FINDINGS: Uterus Measurements: 9.6 x 5.1 x 4.0 cm. No fibroids or other mass visualized. Endometrium Thickness: 1.3 cm.  No focal abnormality visualized. Right ovary Measurements: 3.6 x 1.6 x 2.4 cm. Normal appearance/no adnexal mass. Left ovary Measurements: 2.9 x 1.8 x 1.9 cm. Normal appearance/no adnexal mass. Other findings No abnormal free fluid. IMPRESSION: Unremarkable pelvic ultrasound. Electronically Signed   By: Garald Balding M.D.   On: 04/27/2018 23:39   Ct Abdomen Pelvis W Contrast  Result Date: 04/28/2018 CLINICAL DATA:  Acute onset of right flank and right lower quadrant abdominal pain. EXAM: CT ABDOMEN AND PELVIS WITH CONTRAST TECHNIQUE: Multidetector CT imaging of the abdomen and pelvis was performed using the standard protocol following bolus administration of intravenous contrast. CONTRAST:  1107mL ISOVUE-300 IOPAMIDOL (ISOVUE-300) INJECTION 61% COMPARISON:  CT of the abdomen and pelvis performed 04/15/2018 FINDINGS: Lower chest: The visualized lung bases are grossly clear. The visualized portions of the mediastinum are unremarkable. Hepatobiliary: The liver is unremarkable in appearance. The gallbladder is unremarkable in appearance. The common bile duct remains normal in caliber. Pancreas: The pancreas is within normal limits. Spleen: The spleen is unremarkable in appearance. Adrenals/Urinary Tract:  The adrenal glands are unremarkable in appearance. The kidneys are within normal limits. There is no evidence of hydronephrosis. No renal or ureteral stones are identified. No perinephric stranding is seen. Stomach/Bowel: The stomach is unremarkable in appearance. The small bowel is within normal limits. The appendix is normal in caliber, without evidence of appendicitis. The colon is unremarkable in appearance. Vascular/Lymphatic: The abdominal aorta is unremarkable in appearance. The inferior vena cava is grossly unremarkable. No retroperitoneal lymphadenopathy is seen. No pelvic sidewall lymphadenopathy is identified. Reproductive: The bladder is decompressed and not well characterized. The uterus is unremarkable in appearance. The patient is status post tubal ligation on the right. The patient is status post left-sided oophorectomy and salpingectomy. A residual cystic structure at the left adnexa may be postoperative in nature. Other: No additional soft tissue abnormalities are seen. Postoperative soft tissue stranding is noted at the umbilicus. Musculoskeletal: No acute osseous abnormalities are identified. The visualized musculature is unremarkable in appearance. IMPRESSION: No acute  abnormality seen within the abdomen or pelvis. Electronically Signed   By: Garald Balding M.D.   On: 04/28/2018 02:30     Management plans discussed with the patient, family and they are in agreement.  CODE STATUS: Full Code   TOTAL TIME TAKING CARE OF THIS PATIENT: 30 minutes.    Berna Spare Phillipa Morden M.D on 04/28/2018 at 2:26 PM  Between 7am to 6pm - Pager [phone]  After 6pm go to [email or web address] - Proofreader  [facility]  Office  [phone]  CC: Primary care physician; Lorelee Market, MD   Note: This dictation was prepared with Dragon dictation along with smaller phrase technology. Any transcriptional errors that result from this process are unintentional.

## 2018-04-28 NOTE — ED Notes (Signed)
Report to allison, rn

## 2018-04-28 NOTE — ED Notes (Signed)
Pt states that she was here a couple weeks ago with same pain, pain on her rt flank area radiating into her rlq. Pt states that despite a diagnosis of kidney stone she cont to have abd pain and was discovered to have a left ovarian torsion and needed surg with the removal of her ovary and fallopian tube. Pt states that she has cont to have pain since the surg, she has not had any relief and reports that she is having unbearable pain. Pt is lying on her left side for comfort at this time. Iv team at bedside. No distress noted at this time

## 2018-04-28 NOTE — Discharge Instructions (Signed)
It was so nice to meet you!  You were hospitalized because you were having severe back pain. We treated you with pain medications and your pain got better. We tried to get an MRI, but we were unable to. I would recommend getting an MRI outside the hospital. Your primary care doctor can get this arranged for you, so please follow-up with them in the next 1-2 weeks.

## 2018-04-29 LAB — HIV ANTIBODY (ROUTINE TESTING W REFLEX): HIV Screen 4th Generation wRfx: NONREACTIVE

## 2018-07-22 ENCOUNTER — Emergency Department
Admission: EM | Admit: 2018-07-22 | Discharge: 2018-07-23 | Disposition: A | Discharge status: Home / Self Care | Payer: Medicaid Other | Attending: Emergency Medicine | Admitting: Emergency Medicine

## 2018-07-22 ENCOUNTER — Emergency Department: Payer: Medicaid Other

## 2018-07-22 ENCOUNTER — Encounter: Payer: Self-pay | Admitting: *Deleted

## 2018-07-22 ENCOUNTER — Other Ambulatory Visit: Payer: Self-pay

## 2018-07-22 DIAGNOSIS — N390 Urinary tract infection, site not specified: Secondary | ICD-10-CM | POA: Diagnosis not present

## 2018-07-22 DIAGNOSIS — R1032 Left lower quadrant pain: Secondary | ICD-10-CM | POA: Insufficient documentation

## 2018-07-22 DIAGNOSIS — M25551 Pain in right hip: Secondary | ICD-10-CM

## 2018-07-22 DIAGNOSIS — R1031 Right lower quadrant pain: Secondary | ICD-10-CM | POA: Diagnosis present

## 2018-07-22 DIAGNOSIS — Z79899 Other long term (current) drug therapy: Secondary | ICD-10-CM | POA: Diagnosis not present

## 2018-07-22 DIAGNOSIS — L02411 Cutaneous abscess of right axilla: Secondary | ICD-10-CM | POA: Insufficient documentation

## 2018-07-22 DIAGNOSIS — R102 Pelvic and perineal pain: Secondary | ICD-10-CM

## 2018-07-22 LAB — URINALYSIS, COMPLETE (UACMP) WITH MICROSCOPIC
BILIRUBIN URINE: NEGATIVE
Glucose, UA: NEGATIVE mg/dL
Ketones, ur: NEGATIVE mg/dL
NITRITE: NEGATIVE
PROTEIN: NEGATIVE mg/dL
SPECIFIC GRAVITY, URINE: 1.02 (ref 1.005–1.030)
pH: 6 (ref 5.0–8.0)

## 2018-07-22 LAB — LIPASE, BLOOD: Lipase: 30 U/L (ref 11–51)

## 2018-07-22 LAB — CBC
HEMATOCRIT: 40.8 % (ref 36.0–46.0)
Hemoglobin: 14 g/dL (ref 12.0–15.0)
MCH: 30.6 pg (ref 26.0–34.0)
MCHC: 34.3 g/dL (ref 30.0–36.0)
MCV: 89.1 fL (ref 80.0–100.0)
Platelets: 220 10*3/uL (ref 150–400)
RBC: 4.58 MIL/uL (ref 3.87–5.11)
RDW: 12.1 % (ref 11.5–15.5)
WBC: 11.1 10*3/uL — AB (ref 4.0–10.5)
nRBC: 0 % (ref 0.0–0.2)

## 2018-07-22 LAB — COMPREHENSIVE METABOLIC PANEL
ALT: 53 U/L — AB (ref 0–44)
AST: 37 U/L (ref 15–41)
Albumin: 4 g/dL (ref 3.5–5.0)
Alkaline Phosphatase: 90 U/L (ref 38–126)
Anion gap: 10 (ref 5–15)
BUN: 13 mg/dL (ref 6–20)
CHLORIDE: 106 mmol/L (ref 98–111)
CO2: 22 mmol/L (ref 22–32)
CREATININE: 0.65 mg/dL (ref 0.44–1.00)
Calcium: 9.3 mg/dL (ref 8.9–10.3)
Glucose, Bld: 96 mg/dL (ref 70–99)
Potassium: 3.6 mmol/L (ref 3.5–5.1)
Sodium: 138 mmol/L (ref 135–145)
Total Bilirubin: 0.4 mg/dL (ref 0.3–1.2)
Total Protein: 8 g/dL (ref 6.5–8.1)

## 2018-07-22 LAB — POC URINE PREG, ED: Preg Test, Ur: NEGATIVE

## 2018-07-22 MED ORDER — KETOROLAC TROMETHAMINE 30 MG/ML IJ SOLN
30.0000 mg | Freq: Once | INTRAMUSCULAR | Status: AC
  Administered 2018-07-22: 30 mg via INTRAMUSCULAR
  Filled 2018-07-22: qty 1

## 2018-07-22 MED ORDER — CEPHALEXIN 500 MG PO CAPS
500.0000 mg | ORAL_CAPSULE | Freq: Once | ORAL | Status: AC
  Administered 2018-07-23: 500 mg via ORAL
  Filled 2018-07-22: qty 1

## 2018-07-22 MED ORDER — CEPHALEXIN 500 MG PO CAPS: 500.0000 mg | ORAL_CAPSULE | Freq: Two times a day (BID) | ORAL | 0 refills | Status: DC

## 2018-07-22 NOTE — Discharge Instructions (Signed)
You have been seen in the Emergency Department (ED) for abdominal/pelvic pain.  Your evaluation did not identify a clear cause of your symptoms but was generally reassuring.  You have a mild urinary tract infection (UTI) which may be causing or contributing to your symptoms.  Please take the prescribed antibiotics and use over-the-counter ibuprofen and Tylenol as needed according to label instructions for your pain.  Please follow up as instructed above regarding todays emergent visit and the symptoms that are bothering you.  Return to the ED if your abdominal pain worsens or fails to improve, you develop bloody vomiting, bloody diarrhea, you are unable to tolerate fluids due to vomiting, fever greater than 101, or other symptoms that concern you.

## 2018-07-22 NOTE — ED Provider Notes (Signed)
-----------------------------------------   11:06 PM on 07/22/2018 -----------------------------------------   Assuming care from Dr. Corky Downs.  In short, Kristi Gutierrez is a 37 y.o. female with a chief complaint of abdominal/pelvic pain.  Refer to the original H&P for additional details.  The current plan of care is to follow up U/S.  Anticipate outpatient management unless urgent/emergent issue identified on study.    ----------------------------------------- 11:50 PM on 07/22/2018 -----------------------------------------  Ultrasounds are normal.  I updated the patient with the good news.  I told her about the mild urinary tract infection and that I am treating empirically and she states that she understands.  She has multiple other concerns about the pain in her pelvis as well as some pain in her armpit and the fact that her breasts remain tender, and I reiterated that her work-up is reassuring in the emergency department and that these are issues for which she needs to take over-the-counter ibuprofen and Tylenol and follow-up with her primary care provider and with her OB/GYN.  She says she understands and agrees.  I gave my usual and customary return precautions.    Hinda Kehr, MD 07/22/18 2351

## 2018-07-22 NOTE — ED Triage Notes (Signed)
Pt has right lower abd pain radiating into lower back.  No vag bleeding.  No dysuria.  Pt has vag discharge.  Pt also has abscess to right axilla.  Pt alert.

## 2018-07-22 NOTE — ED Provider Notes (Signed)
[facility] Emergency Department Provider Note   ____________________________________________    I have reviewed the triage vital signs and the nursing notes.   HISTORY  Chief Complaint Abscess and Abdominal Pain     HPI Kristi Gutierrez is a 37 y.o. female who presents with complaints of right lower quadrant abdominal pain.  Patient reports this feels similar to when she had a left-sided fallopian tube cyst required surgery approximately 1 month ago.  She denies fevers or chills.  She has not had any new sexual partners.  Denies vaginal discharge.  No dysuria.  No fevers or chills.  Has not taken anything for this.  Occasionally radiates to her back.   Past Medical History:  Diagnosis Date  . Migraine    currently active, uses Excedrin  . Pseudotumor cerebri     Patient Active Problem List   Diagnosis Date Noted  . Intractable back pain 04/28/2018  . Admission for sterilization 03/01/2015    Past Surgical History:  Procedure Laterality Date  . ATTEMPTED BTL-HAD TO ABORT PROCEDURE  10-2014  . CYST EXCISION    . EXCISION MASS UPPER EXTREMETIES Right 03/14/2018   Procedure: EXCISION ELBOW MASS;  Surgeon: Leanor Kail, MD;  Location: [facility] ORS;  Service: Orthopedics;  Laterality: Right;  . GANGLION CYST EXCISION Left 12/26/2015   Procedure: REMOVAL GANGLION OF WRIST;  Surgeon: Earnestine Leys, MD;  Location: [facility] ORS;  Service: Orthopedics;  Laterality: Left;  . GANGLION CYST EXCISION Left 04/20/2016   Procedure: REMOVAL GANGLION OF WRIST;  Surgeon: Leanor Kail, MD;  Location: [facility] ORS;  Service: Orthopedics;  Laterality: Left;  . LAPAROSCOPIC OVARIAN CYSTECTOMY Left 04/14/2018   Procedure: LAPAROSCOPIC OVARIAN CYSTECTOMY;  Surgeon: Homero Fellers, MD;  Location: [facility] ORS;  Service: Gynecology;  Laterality: Left;  peritubal cyst  . LAPAROSCOPIC TUBAL LIGATION Bilateral 03/01/2015   Procedure: LAPAROSCOPIC TUBAL LIGATION;  Surgeon: Will Bonnet, MD;  Location: [facility] ORS;  Service: Gynecology;  Laterality: Bilateral;  . LAPAROSCOPIC UNILATERAL SALPINGECTOMY Left 04/14/2018   Procedure: LAPAROSCOPIC UNILATERAL SALPINGECTOMY;  Surgeon: Homero Fellers, MD;  Location: [facility] ORS;  Service: Gynecology;  Laterality: Left;  . MOUTH SURGERY    . TUBAL LIGATION      Prior to Admission medications   Medication Sig Start Date End Date Taking? Authorizing Provider  acetaminophen (TYLENOL) 500 MG tablet Take 2 tablets (1,000 mg total) by mouth every 6 (six) hours as needed. 04/14/18 04/14/19  Homero Fellers, MD  ibuprofen (ADVIL,MOTRIN) 800 MG tablet Take 1 tablet (800 mg total) by mouth every 8 (eight) hours as needed. 04/14/18   Schuman, Stefanie Libel, MD  oxyCODONE-acetaminophen (PERCOCET/ROXICET) 5-325 MG tablet Take 1-2 tablets by mouth every 4 (four) hours as needed for moderate pain or severe pain. 04/28/18   Mayo, Pete Pelt, MD     Allergies Patient has no known allergies.  Family History  Problem Relation Age of Onset  . Breast cancer Sister 45       pat 1/2 sister    Social History Social History   Tobacco Use  . Smoking status: Never Smoker  . Smokeless tobacco: Never Used  Substance Use Topics  . Alcohol use: Yes    Comment: occ  . Drug use: No    Review of Systems  Constitutional: No fever/chills Eyes: No visual changes.  ENT: No sore throat. Cardiovascular: Denies chest pain. Respiratory: Denies shortness of breath. Gastrointestinal: As above Genitourinary: As above Musculoskeletal: Negative for back pain. Skin:  Negative for rash. Neurological: Negative for headaches    ____________________________________________   PHYSICAL EXAM:  VITAL SIGNS: ED Triage Vitals  Enc Vitals Group     BP 07/22/18 1952 (!) 141/77     Pulse Rate 07/22/18 1952 89     Resp 07/22/18 1952 20     Temp 07/22/18 1952 98.2 F (36.8 C)     Temp Source 07/22/18 1952 Oral     SpO2 07/22/18 1952 99 %     Weight  07/22/18 1953 136.1 kg (300 lb)     Height 07/22/18 1953 1.676 m (5\' 6" )     Head Circumference --      Peak Flow --      Pain Score 07/22/18 1953 10     Pain Loc --      Pain Edu? --      Excl. in [place]? --     Constitutional: Alert and oriented.  Eyes: Conjunctivae are normal.   Nose: No congestion/rhinnorhea. Mouth/Throat: Mucous membranes are moist.    Cardiovascular: Normal rate, regular rhythm. Grossly normal heart sounds.  Good peripheral circulation. Respiratory: Normal respiratory effort.  No retractions. Gastrointestinal: Minimal tenderness right lower quadrant. No distention.    Musculoskeletal:  Warm and well perfused Neurologic:  Normal speech and language. No gross focal neurologic deficits are appreciated.  Skin:  Skin is warm, dry and intact.  Patient with 3 very small possibly early abscesses in the right axilla, no drainable fluid collections.  No erythema Psychiatric: Mood and affect are normal. Speech and behavior are normal.  ____________________________________________   LABS (all labs ordered are listed, but only abnormal results are displayed)  Labs Reviewed  COMPREHENSIVE METABOLIC PANEL - Abnormal; Notable for the following components:      Result Value   ALT 53 (*)    All other components within normal limits  CBC - Abnormal; Notable for the following components:   WBC 11.1 (*)    All other components within normal limits  LIPASE, BLOOD  URINALYSIS, COMPLETE (UACMP) WITH MICROSCOPIC  POC URINE PREG, ED   ____________________________________________  EKG  None ____________________________________________  RADIOLOGY  Ultrasound pelvis pending ____________________________________________   PROCEDURES  Procedure(s) performed: No  Procedures   Critical Care performed: No ____________________________________________   INITIAL IMPRESSION / ASSESSMENT AND PLAN / ED COURSE  Pertinent labs & imaging results that were available during  my care of the patient were reviewed by me and considered in my medical decision making (see chart for details).  Patient presents with complaints of right lower quadrant abdominal pain, minimal tenderness in this area.  Lab work is overall unremarkable, pending urinalysis.  Will obtain ultrasound given patient's history  Recommended warm compresses for very early abscess right axilla  I have asked my colleague to follow-up on ultrasound, if unremarkable anticipate discharge with outpatient follow-up       ____________________________________________   FINAL CLINICAL IMPRESSION(S) / ED DIAGNOSES  Final diagnoses:  Pelvic pain        Note:  This document was prepared using Dragon voice recognition software and may include unintentional dictation errors.    Lavonia Drafts, MD 07/22/18 2234

## 2018-07-22 NOTE — ED Notes (Signed)
Patient transported to Ultrasound 

## 2018-07-23 NOTE — ED Notes (Signed)
PT left phone charge in room. RN called number listed and was able to tell pt charger would be left at front desk.

## 2018-07-24 LAB — URINE CULTURE: Special Requests: NORMAL

## 2018-11-16 ENCOUNTER — Emergency Department: Payer: Self-pay

## 2018-11-16 ENCOUNTER — Other Ambulatory Visit: Payer: Self-pay

## 2018-11-16 ENCOUNTER — Emergency Department
Admission: EM | Admit: 2018-11-16 | Discharge: 2018-11-17 | Disposition: A | Discharge status: Home / Self Care | Payer: Self-pay | Attending: Emergency Medicine | Admitting: Emergency Medicine

## 2018-11-16 DIAGNOSIS — M545 Low back pain, unspecified: Secondary | ICD-10-CM

## 2018-11-16 DIAGNOSIS — M25561 Pain in right knee: Secondary | ICD-10-CM | POA: Insufficient documentation

## 2018-11-16 DIAGNOSIS — N200 Calculus of kidney: Secondary | ICD-10-CM | POA: Insufficient documentation

## 2018-11-16 DIAGNOSIS — K644 Residual hemorrhoidal skin tags: Secondary | ICD-10-CM | POA: Insufficient documentation

## 2018-11-16 LAB — URINALYSIS, COMPLETE (UACMP) WITH MICROSCOPIC
Bilirubin Urine: NEGATIVE
Glucose, UA: NEGATIVE mg/dL
Ketones, ur: NEGATIVE mg/dL
Nitrite: NEGATIVE
Protein, ur: NEGATIVE mg/dL
Specific Gravity, Urine: 1.023 (ref 1.005–1.030)
pH: 6 (ref 5.0–8.0)

## 2018-11-16 LAB — COMPREHENSIVE METABOLIC PANEL
ALBUMIN: 3.8 g/dL (ref 3.5–5.0)
ALT: 42 U/L (ref 0–44)
AST: 30 U/L (ref 15–41)
Alkaline Phosphatase: 81 U/L (ref 38–126)
Anion gap: 8 (ref 5–15)
BUN: 12 mg/dL (ref 6–20)
CO2: 24 mmol/L (ref 22–32)
Calcium: 9 mg/dL (ref 8.9–10.3)
Chloride: 105 mmol/L (ref 98–111)
Creatinine, Ser: 0.64 mg/dL (ref 0.44–1.00)
GFR calc Af Amer: >60 mL/min (ref >60)
GFR calc non Af Amer: >60 mL/min (ref >60)
GLUCOSE: 123 mg/dL — AB (ref 70–99)
Potassium: 3.6 mmol/L (ref 3.5–5.1)
Sodium: 137 mmol/L (ref 135–145)
Total Bilirubin: 0.8 mg/dL (ref 0.3–1.2)
Total Protein: 7.9 g/dL (ref 6.5–8.1)

## 2018-11-16 LAB — CBC
HCT: 39.8 % (ref 36.0–46.0)
Hemoglobin: 13.3 g/dL (ref 12.0–15.0)
MCH: 30.3 pg (ref 26.0–34.0)
MCHC: 33.4 g/dL (ref 30.0–36.0)
MCV: 90.7 fL (ref 80.0–100.0)
Platelets: 225 10*3/uL (ref 150–400)
RBC: 4.39 MIL/uL (ref 3.87–5.11)
RDW: 11.8 % (ref 11.5–15.5)
WBC: 9 10*3/uL (ref 4.0–10.5)
nRBC: 0 % (ref 0.0–0.2)

## 2018-11-16 LAB — POCT PREGNANCY, URINE: Preg Test, Ur: NEGATIVE

## 2018-11-16 LAB — LIPASE, BLOOD: Lipase: 36 U/L (ref 11–51)

## 2018-11-16 MED ORDER — SODIUM CHLORIDE 0.9% FLUSH: 3.0000 mL | Freq: Once | INTRAVENOUS | Status: DC

## 2018-11-16 MED ORDER — SODIUM CHLORIDE 0.9 % IV BOLUS
1000.0000 mL | Freq: Once | INTRAVENOUS | Status: AC
  Administered 2018-11-16: 1000 mL via INTRAVENOUS

## 2018-11-16 MED ORDER — KETOROLAC TROMETHAMINE 30 MG/ML IJ SOLN
15.0000 mg | INTRAMUSCULAR | Status: AC
  Administered 2018-11-16: 15 mg via INTRAVENOUS
  Filled 2018-11-16: qty 1

## 2018-11-16 NOTE — ED Notes (Signed)
IV attempt x2 unsuccessful. Ena Dawley, RN called to attempt.

## 2018-11-16 NOTE — ED Triage Notes (Addendum)
Pt comes via POV from home with c/o right knee pain. Pt states it started a few weeks ago. Pt  States now it has gotten worse and her back hurts too.  Pt states pain in vagina and rectum. Pt states it hurts to sit. Pt states pain in her abdomen burning sensation.  Pt states burning and pain when she urinates. Pt states it hurts from the outside in.

## 2018-11-16 NOTE — ED Notes (Signed)
Pt is going to medical imaging.   

## 2018-11-16 NOTE — ED Notes (Signed)
Pt given urine cup for collection when able  

## 2018-11-17 MED ORDER — TUCKS 50 % EX PADS: 1.0000 "application " | MEDICATED_PAD | CUTANEOUS | 2 refills | Status: DC | PRN

## 2018-11-17 MED ORDER — DOCUSATE SODIUM 100 MG PO CAPS: 200.0000 mg | ORAL_CAPSULE | Freq: Two times a day (BID) | ORAL | 0 refills | Status: DC

## 2018-11-17 MED ORDER — TRAMADOL HCL 50 MG PO TABS: 50.0000 mg | ORAL_TABLET | Freq: Four times a day (QID) | ORAL | 0 refills | Status: DC | PRN

## 2018-11-17 MED ORDER — NAPROXEN 500 MG PO TABS: 500.0000 mg | ORAL_TABLET | Freq: Two times a day (BID) | ORAL | 0 refills | Status: DC

## 2018-11-17 MED ORDER — LIDOCAINE 5 % EX PTCH: 1.0000 | MEDICATED_PATCH | Freq: Two times a day (BID) | CUTANEOUS | 0 refills | Status: DC

## 2018-11-17 NOTE — ED Provider Notes (Addendum)
[facility] Emergency Department Provider Note  ____________________________________________  Time seen: Approximately 12:48 AM  I have reviewed the triage vital signs and the nursing notes.   HISTORY  Chief Complaint Knee Pain and Abdominal Pain    HPI Kristi Gutierrez is a 38 y.o. female with a history of pseudotumor cerebri and migraine  who complains of right knee pain that started a few weeks ago after going up and down several flights of stairs about 5 times within a day.  During that time she noted a sudden pop and onset of pain in the right knee.  Over the last few weeks she is also developed pain in the right lower back as well that feels like it is related to the knee pain.  She denies vomiting diarrhea bloody stool fevers or chills.  No vaginal bleeding or discharge.  No dysuria frequency urgency.  She states that she feels a burning sensation in the abdomen and a fullness in her rectum.  She denies any foreign body insertion.  Denies any other trauma.  Describes her pain as severe, worse with movement, no alleviating factors     Past Medical History:  Diagnosis Date  . Migraine    currently active, uses Excedrin  . Pseudotumor cerebri      Patient Active Problem List   Diagnosis Date Noted  . Intractable back pain 04/28/2018  . Admission for sterilization 03/01/2015     Past Surgical History:  Procedure Laterality Date  . ATTEMPTED BTL-HAD TO ABORT PROCEDURE  10-2014  . CYST EXCISION    . EXCISION MASS UPPER EXTREMETIES Right 03/14/2018   Procedure: EXCISION ELBOW MASS;  Surgeon: Leanor Kail, MD;  Location: [facility] ORS;  Service: Orthopedics;  Laterality: Right;  . GANGLION CYST EXCISION Left 12/26/2015   Procedure: REMOVAL GANGLION OF WRIST;  Surgeon: Earnestine Leys, MD;  Location: [facility] ORS;  Service: Orthopedics;  Laterality: Left;  . GANGLION CYST EXCISION Left 04/20/2016   Procedure: REMOVAL GANGLION OF WRIST;  Surgeon: Leanor Kail,  MD;  Location: [facility] ORS;  Service: Orthopedics;  Laterality: Left;  . LAPAROSCOPIC OVARIAN CYSTECTOMY Left 04/14/2018   Procedure: LAPAROSCOPIC OVARIAN CYSTECTOMY;  Surgeon: Homero Fellers, MD;  Location: [facility] ORS;  Service: Gynecology;  Laterality: Left;  peritubal cyst  . LAPAROSCOPIC TUBAL LIGATION Bilateral 03/01/2015   Procedure: LAPAROSCOPIC TUBAL LIGATION;  Surgeon: Will Bonnet, MD;  Location: [facility] OR[facility];  Service: Gynecology;  Laterality: Bilateral;  . LAPAROSCOPIC UNILATERAL SALPINGECTOMY Left 04/14/2018   Procedure: LAPAROSCOPIC UNILATERAL SALPINGECTOMY;  Surgeon: Homero Fellers, MD;  Location: [facility] OR[facility];  Service: Gynecology;  Laterality: Left;  . MOUTH SURGERY    . TUBAL LIGATION       Prior to Admission medications   Medication Sig Start Date End Date Taking? Authorizing Provider  acetaminophen (TYLENOL) 500 MG tablet Take 2 tablets (1,000 mg total) by mouth every 6 (six) hours as needed. 04/14/18 04/14/19  Schuman, Stefanie Libel, MD  cephALEXin (KEFLEX) 500 MG capsule Take 1 capsule (500 mg total) by mouth 2 (two) times daily. 07/22/18   Hinda Kehr, MD  docusate sodium (COLACE) 100 MG capsule Take 2 capsules (200 mg total) by mouth 2 (two) times daily. 11/17/18   Carrie Mew, MD  ibuprofen (ADVIL,MOTRIN) 800 MG tablet Take 1 tablet (800 mg total) by mouth every 8 (eight) hours as needed. 04/14/18   Schuman, Christanna R, MD  lidocaine (LIDODERM) 5 % Place 1 patch onto the skin every 12 (twelve) hours. Remove & Discard  patch within 12 hours or as directed by MD 11/17/18   Carrie Mew, MD  naproxen (NAPROSYN) 500 MG tablet Take 1 tablet (500 mg total) by mouth 2 (two) times daily with a meal. 11/17/18   Carrie Mew, MD  oxyCODONE-acetaminophen (PERCOCET/ROXICET) 5-325 MG tablet Take 1-2 tablets by mouth every 4 (four) hours as needed for moderate pain or severe pain. 04/28/18   Mayo, Pete Pelt, MD  traMADol (ULTRAM) 50 MG tablet Take 1 tablet (50 mg total) by  mouth every 6 (six) hours as needed. 11/17/18   Carrie Mew, MD  Witch Hazel (TUCKS) 50 % PADS Apply 1 application topically every 2 (two) hours as needed. 11/17/18   Carrie Mew, MD     Allergies Patient has no known allergies.   Family History  Problem Relation Age of Onset  . Breast cancer Sister 62       pat 1/2 sister    Social History Social History   Tobacco Use  . Smoking status: Never Smoker  . Smokeless tobacco: Never Used  Substance Use Topics  . Alcohol use: Yes    Comment: occ  . Drug use: No    Review of Systems  Constitutional:   No fever or chills.  ENT:   No sore throat. No rhinorrhea. Cardiovascular:   No chest pain or syncope. Respiratory:   No dyspnea or cough. Gastrointestinal:   Negative for abdominal pain, vomiting and diarrhea.  Musculoskeletal: Positive as above right lower back pain and right knee pain All other systems reviewed and are negative except as documented above in ROS and HPI.  ____________________________________________   PHYSICAL EXAM:  VITAL SIGNS: ED Triage Vitals  Enc Vitals Group     BP 11/16/18 1554 (!) 121/101     Pulse Rate 11/16/18 1554 (!) 102     Resp 11/16/18 1554 18     Temp 11/16/18 1554 98.2 F (36.8 C)     Temp Source 11/16/18 1554 Oral     SpO2 11/16/18 1554 96 %     Weight 11/16/18 1551 250 lb (113.4 kg)     Height 11/16/18 1551 5' 6.5" (1.689 m)     Head Circumference --      Peak Flow --      Pain Score 11/16/18 1551 10     Pain Loc --      Pain Edu? --      Excl. in [place]? --     Vital signs reviewed, nursing assessments reviewed.   Constitutional:   Alert and oriented. Non-toxic appearance. Eyes:   Conjunctivae are normal. EOMI. PERRL. ENT      Head:   Normocephalic and atraumatic.      Nose:   No congestion/rhinnorhea.       Mouth/Throat:   MMM, no pharyngeal erythema. No peritonsillar mass.       Neck:   No meningismus. Full ROM. Hematological/Lymphatic/Immunilogical:   No  cervical lymphadenopathy. Cardiovascular:   RRR. Symmetric bilateral radial and DP pulses.  No murmurs. Cap refill less than 2 seconds. Respiratory:   Normal respiratory effort without tachypnea/retractions. Breath sounds are clear and equal bilaterally. No wheezes/rales/rhonchi. Gastrointestinal:   Soft and nontender. Non distended. There is no CVA tenderness.  No rebound, rigidity, or guarding.  Rectal exam performed with nurse at bedside, shows a small inflamed external hemorrhoid, palpation of which reproduces her rectal pain symptoms.  No perirectal inflammation. Musculoskeletal:   Normal range of motion in all extremities. No joint effusions.  Diffuse  tenderness about the right knee without focal bony tenderness.  No inflammatory changes.  Knee joint is stable.  There is also tenderness over the right lower back musculature. Neurologic:   Normal speech and language.  Motor grossly intact. No acute focal neurologic deficits are appreciated.  Skin:    Skin is warm, dry and intact. No rash noted.  No petechiae, purpura, or bullae.  ____________________________________________    LABS (pertinent positives/negatives) (all labs ordered are listed, but only abnormal results are displayed) Labs Reviewed  COMPREHENSIVE METABOLIC PANEL - Abnormal; Notable for the following components:      Result Value   Glucose, Bld 123 (*)    All other components within normal limits  URINALYSIS, COMPLETE (UACMP) WITH MICROSCOPIC - Abnormal; Notable for the following components:   Color, Urine YELLOW (*)    APPearance CLEAR (*)    Hgb urine dipstick MODERATE (*)    Leukocytes,Ua TRACE (*)    Bacteria, UA RARE (*)    All other components within normal limits  LIPASE, BLOOD  CBC  POC URINE PREG, ED  POCT PREGNANCY, URINE   ____________________________________________   EKG    ____________________________________________    RADIOLOGY  Dg Knee Complete 4 Views Right  Result Date:  11/16/2018 CLINICAL DATA:  Right knee pain EXAM: RIGHT KNEE - COMPLETE 4+ VIEW COMPARISON:  None. FINDINGS: No evidence of fracture, dislocation, or joint effusion. No evidence of arthropathy or other focal bone abnormality. Soft tissues are unremarkable. IMPRESSION: Negative. Electronically Signed   By: Rolm Baptise M.D.   On: 11/16/2018 21:58   Ct Renal Stone Study  Result Date: 11/17/2018 CLINICAL DATA:  38 year old female with flank pain and burning with urination. Concern for kidney stone. EXAM: CT ABDOMEN AND PELVIS WITHOUT CONTRAST TECHNIQUE: Multidetector CT imaging of the abdomen and pelvis was performed following the standard protocol without IV contrast. COMPARISON:  CT of the abdomen pelvis dated 04/28/2018 FINDINGS: Evaluation of this exam is limited in the absence of intravenous contrast. Lower chest: The visualized lung bases are clear. No intra-abdominal free air or free fluid. Hepatobiliary: There is diffuse fatty infiltration of the liver. No intrahepatic biliary ductal dilatation. The gallbladder is unremarkable. Pancreas: Unremarkable. No pancreatic ductal dilatation or surrounding inflammatory changes. Spleen: Normal in size without focal abnormality. Adrenals/Urinary Tract: The adrenal glands are unremarkable. There is a 2 mm nonobstructing left renal upper pole calculus. No hydronephrosis. The right kidney is unremarkable. The visualized ureters appear unremarkable. The urinary bladder is collapsed. Stomach/Bowel: There is no bowel obstruction or active inflammation. The appendix is not visualized with certainty. No inflammatory changes identified in the right lower quadrant. Vascular/Lymphatic: The abdominal aorta and IVC are grossly unremarkable on this noncontrast CT. No portal venous gas. There is no adenopathy. Reproductive: The uterus is anteverted and grossly unremarkable. The ovaries appear unremarkable as visualized. A surgical clip noted along the posterior aspect of the uterus.  Other: None Musculoskeletal: No acute or significant osseous findings. IMPRESSION: 1. A 2 mm nonobstructing left renal upper pole calculus. No hydronephrosis or obstructing stone. 2. Fatty liver. 3. No bowel obstruction or active inflammation. Electronically Signed   By: Anner Crete M.D.   On: 11/17/2018 00:08    ____________________________________________   PROCEDURES Procedures  ____________________________________________  DIFFERENTIAL DIAGNOSIS   Knee fracture, lumbar compression fracture, kidney stone, cystitis  CLINICAL IMPRESSION / ASSESSMENT AND PLAN / ED COURSE  Medications ordered in the ED: Medications  sodium chloride flush (NS) 0.9 % injection 3 mL (has  no administration in time range)  sodium chloride 0.9 % bolus 1,000 mL (0 mLs Intravenous Stopped 11/16/18 2311)  ketorolac (TORADOL) 30 MG/ML injection 15 mg (15 mg Intravenous Given 11/16/18 2115)    Pertinent labs & imaging results that were available during my care of the patient were reviewed by me and considered in my medical decision making (see chart for details).    Patient is nontoxic but complaining of severe right low back pain and right knee pain, by history.  Musculoskeletal.  Labs are unremarkable.  Urinalysis is unremarkable as well.  Due to the reported severity of pain, x-ray of the knee was obtained which is negative and a CT scan of the abdomen was obtained which is also unremarkable.  Plan to treat conservatively with heat therapy, lidocaine patch, NSAIDs, follow-up with primary care.  Patient complaining of severe pain related to her hemorrhoid, I will give her a very limited amount of Ultram.      ____________________________________________   FINAL CLINICAL IMPRESSION(S) / ED DIAGNOSES    Final diagnoses:  Acute pain of right knee  Acute right-sided low back pain without sciatica  Kidney stone  External hemorrhoid     ED Discharge Orders         Ordered    naproxen (NAPROSYN)  500 MG tablet  2 times daily with meals     11/17/18 0047    lidocaine (LIDODERM) 5 %  Every 12 hours     11/17/18 0047    docusate sodium (COLACE) 100 MG capsule  2 times daily     11/17/18 0103    Witch Hazel (TUCKS) 50 % PADS  Every 2 hours PRN     11/17/18 0103    traMADol (ULTRAM) 50 MG tablet  Every 6 hours PRN     11/17/18 0104          Portions of this note were generated with dragon dictation software. Dictation errors may occur despite best attempts at proofreading.   Carrie Mew, MD 11/17/18 9470    Carrie Mew, MD 11/17/18 [phone]

## 2018-11-17 NOTE — Discharge Instructions (Addendum)
Your lab tests were unremarkable today.  Your CT scan does show a small kidney stone.  Your pain appears to be related to muscle strain and spasm after the incident on the stairs, but there are no serious injuries seen on x-ray and CT scan.  Take anti-inflammatory medicine such as naproxen as well as use lidocaine patch and heating pad to help with your symptoms.  This can be expected to gradually improve over the next several days.

## 2018-11-23 NOTE — Progress Notes (Deleted)
PCP:  Lorelee Market, MD   No chief complaint on file.    HPI:      [name] is a 38 y.o. G3P0 who LMP was Patient's last menstrual period was 11/06/2018., presents today for her annual examination.  Her menses are {norm/abn:715}, lasting {number:22536} days.  Dysmenorrhea {dysmen:716}. She {does:18564} have intermenstrual bleeding.  Sex activity: {sex active:315163}.  Last Pap: {XIPJ:825053976}  Results were: {norm/abn:16707::"no abnormalities"} /neg HPV DNA *** Hx of STDs: {STD hx:14358}  Last mammogram: {date:304500300}  Results were: {norm/abn:13465} There is no FH of breast cancer. There is no FH of ovarian cancer. The patient {does:18564} do self-breast exams.  Tobacco use: {tob:20664} Alcohol use: {Alcohol:11675} No drug use.  Exercise: {exercise:31265}  She {does:18564} get adequate calcium and Vitamin D in her diet.   Past Medical History:  Diagnosis Date  . Migraine    currently active, uses Excedrin  . Pseudotumor cerebri     Past Surgical History:  Procedure Laterality Date  . ATTEMPTED BTL-HAD TO ABORT PROCEDURE  10-2014  . CYST EXCISION    . EXCISION MASS UPPER EXTREMETIES Right 03/14/2018   Procedure: EXCISION ELBOW MASS;  Surgeon: Leanor Kail, MD;  Location: [facility] ORS;  Service: Orthopedics;  Laterality: Right;  . GANGLION CYST EXCISION Left 12/26/2015   Procedure: REMOVAL GANGLION OF WRIST;  Surgeon: Earnestine Leys, MD;  Location: [facility] OR[facility];  Service: Orthopedics;  Laterality: Left;  . GANGLION CYST EXCISION Left 04/20/2016   Procedure: REMOVAL GANGLION OF WRIST;  Surgeon: Leanor Kail, MD;  Location: [facility];  Service: Orthopedics;  Laterality: Left;  . LAPAROSCOPIC OVARIAN CYSTECTOMY Left 04/14/2018   Procedure: LAPAROSCOPIC OVARIAN CYSTECTOMY;  Surgeon: Homero Fellers, MD;  Location: [facility];  Service: Gynecology;  Laterality: Left;  peritubal cyst  . LAPAROSCOPIC TUBAL LIGATION Bilateral 03/01/2015   Procedure: LAPAROSCOPIC  TUBAL LIGATION;  Surgeon: Will Bonnet, MD;  Location: [facility];  Service: Gynecology;  Laterality: Bilateral;  . LAPAROSCOPIC UNILATERAL SALPINGECTOMY Left 04/14/2018   Procedure: LAPAROSCOPIC UNILATERAL SALPINGECTOMY;  Surgeon: Homero Fellers, MD;  Location: [facility];  Service: Gynecology;  Laterality: Left;  . MOUTH SURGERY    . TUBAL LIGATION      Family History  Problem Relation Age of Onset  . Breast cancer Sister 52       pat 1/2 sister    Social History   Socioeconomic History  . Marital status: Married    Spouse name: Not on file  . Number of children: Not on file  . Years of education: Not on file  . Highest education level: Not on file  Occupational History  . Not on file  Social Needs  . Financial resource strain: Not on file  . Food insecurity:    Worry: Not on file    Inability: Not on file  . Transportation needs:    Medical: Not on file    Non-medical: Not on file  Tobacco Use  . Smoking status: Never Smoker  . Smokeless tobacco: Never Used  Substance and Sexual Activity  . Alcohol use: Yes    Comment: occ  . Drug use: No  . Sexual activity: Yes    Birth control/protection: Other-see comments    Comment: tubal ligation  Lifestyle  . Physical activity:    Days per week: Not on file    Minutes per session: Not on file  . Stress: Not on file  Relationships  . Social connections:    Talks on phone: Not on file  Gets together: Not on file    Attends religious service: Not on file    Active member of club or organization: Not on file    Attends meetings of clubs or organizations: Not on file    Relationship status: Not on file  . Intimate partner violence:    Fear of current or ex partner: Not on file    Emotionally abused: Not on file    Physically abused: Not on file    Forced sexual activity: Not on file  Other Topics Concern  . Not on file  Social History Narrative  . Not on file    Outpatient Medications Prior to Visit    Medication Sig Dispense Refill  . acetaminophen (TYLENOL) 500 MG tablet Take 2 tablets (1,000 mg total) by mouth every 6 (six) hours as needed. 50 tablet 2  . cephALEXin (KEFLEX) 500 MG capsule Take 1 capsule (500 mg total) by mouth 2 (two) times daily. 14 capsule 0  . docusate sodium (COLACE) 100 MG capsule Take 2 capsules (200 mg total) by mouth 2 (two) times daily. 120 capsule 0  . ibuprofen (ADVIL,MOTRIN) 800 MG tablet Take 1 tablet (800 mg total) by mouth every 8 (eight) hours as needed. 50 tablet 0  . lidocaine (LIDODERM) 5 % Place 1 patch onto the skin every 12 (twelve) hours. Remove & Discard patch within 12 hours or as directed by MD 15 patch 0  . naproxen (NAPROSYN) 500 MG tablet Take 1 tablet (500 mg total) by mouth 2 (two) times daily with a meal. 20 tablet 0  . oxyCODONE-acetaminophen (PERCOCET/ROXICET) 5-325 MG tablet Take 1-2 tablets by mouth every 4 (four) hours as needed for moderate pain or severe pain. 20 tablet 0  . traMADol (ULTRAM) 50 MG tablet Take 1 tablet (50 mg total) by mouth every 6 (six) hours as needed. 8 tablet 0  . Witch Hazel (TUCKS) 50 % PADS Apply 1 application topically every 2 (two) hours as needed. 40 each 2   No facility-administered medications prior to visit.       ROS:  Review of Systems BREAST: No symptoms   Objective: LMP 11/06/2018 Comment: preg test waiver signed   OBGyn Exam  Results: No results found for this or any previous visit (from the past 24 hour(s)).  Assessment/Plan: No diagnosis found.  No orders of the defined types were placed in this encounter.            GYN counsel {counseling:16159}     F/U  No follow-ups on file.  Timoty Bourke B. Charlis Harner, PA-C 11/23/2018 7:58 PM

## 2018-11-24 ENCOUNTER — Encounter: Payer: Self-pay | Admitting: Obstetrics and Gynecology

## 2018-11-24 ENCOUNTER — Ambulatory Visit: Payer: Medicaid Other | Admitting: Obstetrics and Gynecology

## 2018-12-01 ENCOUNTER — Ambulatory Visit: Payer: Medicaid Other | Admitting: Obstetrics and Gynecology

## 2018-12-01 NOTE — Progress Notes (Deleted)
PCP:  Lorelee Market, MD   No chief complaint on file.  FP MCD  HPI:      [name] is a 38 y.o. G3P0 who LMP was Patient's last menstrual period was 11/06/2018., presents today for her annual examination.  Her menses are {norm/abn:715}, lasting {number:22536} days.  Dysmenorrhea {dysmen:716}. She {does:18564} have intermenstrual bleeding.  Sex activity: {sex active:315163}.  Last Pap: {ASTM:196222979}  Results were: {norm/abn:16707::"no abnormalities"} /neg HPV DNA *** Hx of STDs: {STD hx:14358}  Last mammogram: {date:304500300}  Results were: {norm/abn:13465} There is no FH of breast cancer. There is no FH of ovarian cancer. The patient {does:18564} do self-breast exams.  Tobacco use: {tob:20664} Alcohol use: {Alcohol:11675} No drug use.  Exercise: {exercise:31265}  She {does:18564} get adequate calcium and Vitamin D in her diet.   Past Medical History:  Diagnosis Date  . Migraine    currently active, uses Excedrin  . Pseudotumor cerebri     Past Surgical History:  Procedure Laterality Date  . ATTEMPTED BTL-HAD TO ABORT PROCEDURE  10-2014  . CYST EXCISION    . EXCISION MASS UPPER EXTREMETIES Right 03/14/2018   Procedure: EXCISION ELBOW MASS;  Surgeon: Leanor Kail, MD;  Location: [facility];  Service: Orthopedics;  Laterality: Right;  . GANGLION CYST EXCISION Left 12/26/2015   Procedure: REMOVAL GANGLION OF WRIST;  Surgeon: Earnestine Leys, MD;  Location: [facility];  Service: Orthopedics;  Laterality: Left;  . GANGLION CYST EXCISION Left 04/20/2016   Procedure: REMOVAL GANGLION OF WRIST;  Surgeon: Leanor Kail, MD;  Location: [facility];  Service: Orthopedics;  Laterality: Left;  . LAPAROSCOPIC OVARIAN CYSTECTOMY Left 04/14/2018   Procedure: LAPAROSCOPIC OVARIAN CYSTECTOMY;  Surgeon: Homero Fellers, MD;  Location: [facility];  Service: Gynecology;  Laterality: Left;  peritubal cyst  . LAPAROSCOPIC TUBAL LIGATION Bilateral 03/01/2015   Procedure:  LAPAROSCOPIC TUBAL LIGATION;  Surgeon: Will Bonnet, MD;  Location: [facility];  Service: Gynecology;  Laterality: Bilateral;  . LAPAROSCOPIC UNILATERAL SALPINGECTOMY Left 04/14/2018   Procedure: LAPAROSCOPIC UNILATERAL SALPINGECTOMY;  Surgeon: Homero Fellers, MD;  Location: [facility];  Service: Gynecology;  Laterality: Left;  . MOUTH SURGERY    . TUBAL LIGATION      Family History  Problem Relation Age of Onset  . Arthritis Mother   . Diabetes Mother        type 2  . Breast cancer Sister 42       pat 1/2 sister    Social History   Socioeconomic History  . Marital status: Married    Spouse name: Not on file  . Number of children: Not on file  . Years of education: Not on file  . Highest education level: Not on file  Occupational History  . Not on file  Social Needs  . Financial resource strain: Not on file  . Food insecurity:    Worry: Not on file    Inability: Not on file  . Transportation needs:    Medical: Not on file    Non-medical: Not on file  Tobacco Use  . Smoking status: Never Smoker  . Smokeless tobacco: Never Used  Substance and Sexual Activity  . Alcohol use: Yes    Comment: occ  . Drug use: No  . Sexual activity: Yes    Birth control/protection: Other-see comments    Comment: tubal ligation  Lifestyle  . Physical activity:    Days per week: Not on file    Minutes per session: Not on file  .  Stress: Not on file  Relationships  . Social connections:    Talks on phone: Not on file    Gets together: Not on file    Attends religious service: Not on file    Active member of club or organization: Not on file    Attends meetings of clubs or organizations: Not on file    Relationship status: Not on file  . Intimate partner violence:    Fear of current or ex partner: Not on file    Emotionally abused: Not on file    Physically abused: Not on file    Forced sexual activity: Not on file  Other Topics Concern  . Not on file  Social History  Narrative  . Not on file    Outpatient Medications Prior to Visit  Medication Sig Dispense Refill  . acetaminophen (TYLENOL) 500 MG tablet Take 2 tablets (1,000 mg total) by mouth every 6 (six) hours as needed. 50 tablet 2  . cephALEXin (KEFLEX) 500 MG capsule Take 1 capsule (500 mg total) by mouth 2 (two) times daily. 14 capsule 0  . docusate sodium (COLACE) 100 MG capsule Take 2 capsules (200 mg total) by mouth 2 (two) times daily. 120 capsule 0  . ibuprofen (ADVIL,MOTRIN) 800 MG tablet Take 1 tablet (800 mg total) by mouth every 8 (eight) hours as needed. 50 tablet 0  . lidocaine (LIDODERM) 5 % Place 1 patch onto the skin every 12 (twelve) hours. Remove & Discard patch within 12 hours or as directed by MD 15 patch 0  . naproxen (NAPROSYN) 500 MG tablet Take 1 tablet (500 mg total) by mouth 2 (two) times daily with a meal. 20 tablet 0  . oxyCODONE-acetaminophen (PERCOCET/ROXICET) 5-325 MG tablet Take 1-2 tablets by mouth every 4 (four) hours as needed for moderate pain or severe pain. 20 tablet 0  . traMADol (ULTRAM) 50 MG tablet Take 1 tablet (50 mg total) by mouth every 6 (six) hours as needed. 8 tablet 0  . Witch Hazel (TUCKS) 50 % PADS Apply 1 application topically every 2 (two) hours as needed. 40 each 2   No facility-administered medications prior to visit.       ROS:  Review of Systems BREAST: No symptoms   Objective: LMP 11/06/2018 Comment: preg test waiver signed   OBGyn Exam  Results: No results found for this or any previous visit (from the past 24 hour(s)).  Assessment/Plan: No diagnosis found.  No orders of the defined types were placed in this encounter.            GYN counsel {counseling:16159}     F/U  No follow-ups on file.  Rossie Scarfone B. Graham Hyun, PA-C 12/01/2018 2:02 PM

## 2019-06-01 ENCOUNTER — Other Ambulatory Visit: Payer: Self-pay | Admitting: Obstetrics and Gynecology

## 2019-10-13 DIAGNOSIS — D3615 Benign neoplasm of peripheral nerves and autonomic nervous system of abdomen: Secondary | ICD-10-CM | POA: Insufficient documentation

## 2019-10-30 ENCOUNTER — Other Ambulatory Visit: Payer: Self-pay

## 2019-10-30 ENCOUNTER — Emergency Department
Admission: EM | Admit: 2019-10-30 | Discharge: 2019-10-30 | Disposition: A | Discharge status: Home / Self Care | Payer: Self-pay | Attending: Emergency Medicine | Admitting: Emergency Medicine

## 2019-10-30 ENCOUNTER — Emergency Department: Payer: Self-pay

## 2019-10-30 DIAGNOSIS — L03012 Cellulitis of left finger: Secondary | ICD-10-CM | POA: Insufficient documentation

## 2019-10-30 LAB — CBC WITH DIFFERENTIAL/PLATELET
Abs Immature Granulocytes: 0.04 10*3/uL (ref 0.00–0.07)
Basophils Absolute: 0 10*3/uL (ref 0.0–0.1)
Basophils Relative: 0 %
Eosinophils Absolute: 0.2 10*3/uL (ref 0.0–0.5)
Eosinophils Relative: 2 %
HCT: 38.6 % (ref 36.0–46.0)
Hemoglobin: 13 g/dL (ref 12.0–15.0)
Immature Granulocytes: 0 %
Lymphocytes Relative: 21 %
Lymphs Abs: 2.4 10*3/uL (ref 0.7–4.0)
MCH: 30.4 pg (ref 26.0–34.0)
MCHC: 33.7 g/dL (ref 30.0–36.0)
MCV: 90.4 fL (ref 80.0–100.0)
Monocytes Absolute: 1.3 10*3/uL — ABNORMAL HIGH (ref 0.1–1.0)
Monocytes Relative: 11 %
Neutro Abs: 7.6 10*3/uL (ref 1.7–7.7)
Neutrophils Relative %: 66 %
Platelets: 204 10*3/uL (ref 150–400)
RBC: 4.27 MIL/uL (ref 3.87–5.11)
RDW: 12.2 % (ref 11.5–15.5)
WBC: 11.5 10*3/uL — ABNORMAL HIGH (ref 4.0–10.5)
nRBC: 0 % (ref 0.0–0.2)

## 2019-10-30 LAB — BASIC METABOLIC PANEL
Anion gap: 8 (ref 5–15)
BUN: 11 mg/dL (ref 6–20)
CO2: 22 mmol/L (ref 22–32)
Calcium: 8.3 mg/dL — ABNORMAL LOW (ref 8.9–10.3)
Chloride: 107 mmol/L (ref 98–111)
Creatinine, Ser: 0.71 mg/dL (ref 0.44–1.00)
GFR calc Af Amer: >60 mL/min (ref >60)
GFR calc non Af Amer: >60 mL/min (ref >60)
Glucose, Bld: 115 mg/dL — ABNORMAL HIGH (ref 70–99)
Potassium: 4.4 mmol/L (ref 3.5–5.1)
Sodium: 137 mmol/L (ref 135–145)

## 2019-10-30 MED ORDER — MORPHINE SULFATE (PF) 4 MG/ML IV SOLN
4.0000 mg | Freq: Once | INTRAVENOUS | Status: AC
  Administered 2019-10-30: 13:00:00 4 mg via INTRAVENOUS
  Filled 2019-10-30: qty 1

## 2019-10-30 MED ORDER — OXYCODONE-ACETAMINOPHEN 7.5-325 MG PO TABS: 1.0000 | ORAL_TABLET | Freq: Four times a day (QID) | ORAL | 0 refills | Status: DC | PRN

## 2019-10-30 MED ORDER — IBUPROFEN 600 MG PO TABS: 600.0000 mg | ORAL_TABLET | Freq: Three times a day (TID) | ORAL | 0 refills | Status: DC | PRN

## 2019-10-30 MED ORDER — ONDANSETRON HCL 4 MG/2ML IJ SOLN
4.0000 mg | Freq: Once | INTRAMUSCULAR | Status: AC
  Administered 2019-10-30: 4 mg via INTRAVENOUS
  Filled 2019-10-30: qty 2

## 2019-10-30 MED ORDER — BACITRACIN-NEOMYCIN-POLYMYXIN 400-5-5000 EX OINT
TOPICAL_OINTMENT | Freq: Once | CUTANEOUS | Status: AC
  Administered 2019-10-30: 1 via TOPICAL
  Filled 2019-10-30: qty 1

## 2019-10-30 MED ORDER — CLINDAMYCIN PHOSPHATE 600 MG/50ML IV SOLN
600.0000 mg | Freq: Once | INTRAVENOUS | Status: AC
  Administered 2019-10-30: 600 mg via INTRAVENOUS
  Filled 2019-10-30: qty 50

## 2019-10-30 MED ORDER — MORPHINE SULFATE (PF) 2 MG/ML IV SOLN
2.0000 mg | Freq: Once | INTRAVENOUS | Status: AC
  Administered 2019-10-30: 14:00:00 2 mg via INTRAVENOUS
  Filled 2019-10-30: qty 1

## 2019-10-30 MED ORDER — CLINDAMYCIN HCL 300 MG PO CAPS: 300.0000 mg | ORAL_CAPSULE | Freq: Three times a day (TID) | ORAL | 0 refills | Status: AC

## 2019-10-30 NOTE — Discharge Instructions (Addendum)
All discharge care instruction take medication as directed.  Advised to follow-up with PCP.

## 2019-10-30 NOTE — ED Provider Notes (Signed)
[facility] Emergency Department Provider Note   ____________________________________________   First MD Initiated Contact with Patient 10/30/19 1223     (approximate)  I have reviewed the triage vital signs and the nursing notes.   HISTORY  Chief Complaint Hand Pain    HPI Kristi Gutierrez is a 39 y.o. female patient presents with edema and erythema starting at the MPJ of the left index finger.  Erythematous radiating up the forearm.  Patient also complain of left axillary and left breast pain associated with complaint.  Patient states nausea with no vomiting.  Patient rates the pain as 7/10.  Patient denies loss of sensation.  Patient states pain increased with flexion of the finger.  Patient unsure of fever.  Patient stated no drainage.  Patient is right-hand dominant.  Patient had a ganglion cyst removed recently from the right wrist.      Past Medical History:  Diagnosis Date  . Migraine    currently active, uses Excedrin  . Pseudotumor cerebri     Patient Active Problem List   Diagnosis Date Noted  . Intractable back pain 04/28/2018  . Admission for sterilization 03/01/2015    Past Surgical History:  Procedure Laterality Date  . ATTEMPTED BTL-HAD TO ABORT PROCEDURE  10-2014  . CYST EXCISION    . EXCISION MASS UPPER EXTREMETIES Right 03/14/2018   Procedure: EXCISION ELBOW MASS;  Surgeon: Leanor Kail, MD;  Location: [facility] ORS;  Service: Orthopedics;  Laterality: Right;  . GANGLION CYST EXCISION Left 12/26/2015   Procedure: REMOVAL GANGLION OF WRIST;  Surgeon: Earnestine Leys, MD;  Location: [facility] ORS;  Service: Orthopedics;  Laterality: Left;  . GANGLION CYST EXCISION Left 04/20/2016   Procedure: REMOVAL GANGLION OF WRIST;  Surgeon: Leanor Kail, MD;  Location: [facility] ORS;  Service: Orthopedics;  Laterality: Left;  . LAPAROSCOPIC OVARIAN CYSTECTOMY Left 04/14/2018   Procedure: LAPAROSCOPIC OVARIAN CYSTECTOMY;  Surgeon: Homero Fellers, MD;   Location: [facility] ORS;  Service: Gynecology;  Laterality: Left;  peritubal cyst  . LAPAROSCOPIC TUBAL LIGATION Bilateral 03/01/2015   Procedure: LAPAROSCOPIC TUBAL LIGATION;  Surgeon: Will Bonnet, MD;  Location: [facility] ORS;  Service: Gynecology;  Laterality: Bilateral;  . LAPAROSCOPIC UNILATERAL SALPINGECTOMY Left 04/14/2018   Procedure: LAPAROSCOPIC UNILATERAL SALPINGECTOMY;  Surgeon: Homero Fellers, MD;  Location: [facility] ORS;  Service: Gynecology;  Laterality: Left;  . MOUTH SURGERY    . TUBAL LIGATION      Prior to Admission medications   Medication Sig Start Date End Date Taking? Authorizing Provider  clindamycin (CLEOCIN) 300 MG capsule Take 1 capsule (300 mg total) by mouth 3 (three) times daily for 10 days. 10/30/19 11/09/19  Sable Feil, PA-C  docusate sodium (COLACE) 100 MG capsule Take 2 capsules (200 mg total) by mouth 2 (two) times daily. 11/17/18   Carrie Mew, MD  ibuprofen (ADVIL) 600 MG tablet Take 1 tablet (600 mg total) by mouth every 8 (eight) hours as needed. 10/30/19   Sable Feil, PA-C  oxyCODONE-acetaminophen (PERCOCET) 7.5-325 MG tablet Take 1 tablet by mouth every 6 (six) hours as needed. 10/30/19   Sable Feil, PA-C  Witch Hazel (TUCKS) 50 % PADS Apply 1 application topically every 2 (two) hours as needed. 11/17/18   Carrie Mew, MD    Allergies Patient has no known allergies.  Family History  Problem Relation Age of Onset  . Arthritis Mother   . Diabetes Mother        type 2  . Breast cancer  Sister 76       pat 1/2 sister    Social History Social History   Tobacco Use  . Smoking status: Never Smoker  . Smokeless tobacco: Never Used  Substance Use Topics  . Alcohol use: Yes    Comment: occ  . Drug use: No    Review of Systems Constitutional: No fever/chills Eyes: No visual changes. ENT: No sore throat. Cardiovascular: Denies chest pain. Respiratory: Denies shortness of breath. Gastrointestinal: No abdominal pain.  No nausea,  no vomiting.  No diarrhea.  No constipation. Genitourinary: Negative for dysuria. Musculoskeletal: Left index finger pain. Skin: Negative for rash.  Patient swelling to the left index finger. Neurological: Negative for headaches, focal weakness or numbness.   ____________________________________________   PHYSICAL EXAM:  VITAL SIGNS: ED Triage Vitals [10/30/19 1214]  Enc Vitals Group     BP 118/69     Pulse Rate 94     Resp 18     Temp 98.3 F (36.8 C)     Temp src      SpO2 100 %     Weight 270 lb (122.5 kg)     Height 5' 6.5" (1.689 m)     Head Circumference      Peak Flow      Pain Score 7     Pain Loc      Pain Edu?      Excl. in [place]?    Constitutional: Alert and oriented. Well appearing and in no acute distress.  Morbid obesity. Cardiovascular: Normal rate, regular rhythm. Grossly normal heart sounds.  Good peripheral circulation. Respiratory: Normal respiratory effort.  No retractions. Lungs CTAB. Genitourinary: Deferred Neurologic:  Normal speech and language. No gross focal neurologic deficits are appreciated. No gait instability. Skin: Edema and erythema to MPJ of second digit left hand.  Psychiatric: Mood and affect are normal. Speech and behavior are normal.  ____________________________________________   LABS (all labs ordered are listed, but only abnormal results are displayed)  Labs Reviewed  CBC WITH DIFFERENTIAL/PLATELET - Abnormal; Notable for the following components:      Result Value   WBC 11.5 (*)    Monocytes Absolute 1.3 (*)    All other components within normal limits  BASIC METABOLIC PANEL   ____________________________________________  EKG   ____________________________________________  RADIOLOGY  ED MD interpretation:    Official radiology report(s): DG Finger Index Left  Result Date: 10/30/2019 CLINICAL DATA:  Acute left index finger pain and swelling without known injury. EXAM: LEFT INDEX FINGER 2+V COMPARISON:  October 02, 2015. FINDINGS: There is no evidence of fracture or dislocation. There is no evidence of arthropathy or other focal bone abnormality. Soft tissues are unremarkable. IMPRESSION: Negative. Electronically Signed   By: Marijo Conception M.D.   On: 10/30/2019 12:58    ____________________________________________   PROCEDURES  Procedure(s) performed (including Critical Care):  Procedures   ____________________________________________   INITIAL IMPRESSION / ASSESSMENT AND PLAN / ED COURSE  As part of my medical decision making, I reviewed the following data within the [place]     Patient presents with cellulitis to the left index finger secondary to a puncture wound.  Patient ruptured a blister that was on the finger.  Discussed labs and x-ray results with patient.  Patient given discharge care instruction advised take medication as directed.  Patient advised follow-up with PCP in 1 week.  Return to ED if condition worsens.    Kristi Gutierrez was evaluated in Emergency  Department on 10/30/2019 for the symptoms described in the history of present illness. She was evaluated in the context of the global COVID-19 pandemic, which necessitated consideration that the patient might be at risk for infection with the SARS-CoV-2 virus that causes COVID-19. Institutional protocols and algorithms that pertain to the evaluation of patients at risk for COVID-19 are in a state of rapid change based on information released by regulatory bodies including the CDC and federal and state organizations. These policies and algorithms were followed during the patient's care in the ED.       ____________________________________________   FINAL CLINICAL IMPRESSION(S) / ED DIAGNOSES  Final diagnoses:  Cellulitis of finger of left hand     ED Discharge Orders         Ordered    clindamycin (CLEOCIN) 300 MG capsule  3 times daily     10/30/19 1347    oxyCODONE-acetaminophen (PERCOCET) 7.5-325 MG  tablet  Every 6 hours PRN     10/30/19 1347    ibuprofen (ADVIL) 600 MG tablet  Every 8 hours PRN     10/30/19 1347           Note:  This document was prepared using Dragon voice recognition software and may include unintentional dictation errors.    Sable Feil, PA-C 10/30/19 1352    Arta Silence, MD 10/30/19 [phone]

## 2019-10-30 NOTE — ED Notes (Signed)
See triage note  Presents with swelling to left index finger  States she noticed a blister on finger 3 days ago  Now has increased swelling with red streak moving into hand  States pain is moving up arm into axilla

## 2019-10-30 NOTE — ED Triage Notes (Addendum)
Pt comes via POV from home with laceration noted to left pointer finger. Pt states three days ago she noticed it. Pt denies any recent injury. Pt states it started off as a blister and then advanced.  Pt has swelling and redness noted to left pointer finger. No drainage noted.   Pt states it is making her arm and hand hurt. Pt states pain under her breast too. Pt denies any CP or SOB.

## 2020-08-02 ENCOUNTER — Encounter: Payer: Self-pay | Admitting: *Deleted

## 2020-08-02 ENCOUNTER — Other Ambulatory Visit: Payer: Self-pay

## 2020-08-02 DIAGNOSIS — N939 Abnormal uterine and vaginal bleeding, unspecified: Secondary | ICD-10-CM | POA: Insufficient documentation

## 2020-08-02 DIAGNOSIS — Z5321 Procedure and treatment not carried out due to patient leaving prior to being seen by health care provider: Secondary | ICD-10-CM | POA: Insufficient documentation

## 2020-08-02 LAB — BASIC METABOLIC PANEL
Anion gap: 11 (ref 5–15)
BUN: 13 mg/dL (ref 6–20)
CO2: 20 mmol/L — ABNORMAL LOW (ref 22–32)
Calcium: 8.8 mg/dL — ABNORMAL LOW (ref 8.9–10.3)
Chloride: 105 mmol/L (ref 98–111)
Creatinine, Ser: 0.67 mg/dL (ref 0.44–1.00)
GFR, Estimated: >60 mL/min (ref >60)
Glucose, Bld: 111 mg/dL — ABNORMAL HIGH (ref 70–99)
Potassium: 3.7 mmol/L (ref 3.5–5.1)
Sodium: 136 mmol/L (ref 135–145)

## 2020-08-02 LAB — CBC
HCT: 38.3 % (ref 36.0–46.0)
Hemoglobin: 13 g/dL (ref 12.0–15.0)
MCH: 30.9 pg (ref 26.0–34.0)
MCHC: 33.9 g/dL (ref 30.0–36.0)
MCV: 91 fL (ref 80.0–100.0)
Platelets: 236 10*3/uL (ref 150–400)
RBC: 4.21 MIL/uL (ref 3.87–5.11)
RDW: 12.4 % (ref 11.5–15.5)
WBC: 11.8 10*3/uL — ABNORMAL HIGH (ref 4.0–10.5)
nRBC: 0 % (ref 0.0–0.2)

## 2020-08-02 NOTE — ED Triage Notes (Signed)
Pt to ED reporting heavy vaginal bleeding starting yesterday. Pt reports significant amount of clots and using approx 1 pad per hour. Pt also reports when she is urinating she feels like there is more blood and is concerned she is urinating blood as well. Pt reporting severe cramping and pain as well. Pt denies being pregnant with last period being 2 months ago.

## 2020-08-02 NOTE — ED Notes (Signed)
Pt states she is unable to urinate at this time.

## 2020-08-03 ENCOUNTER — Emergency Department
Admission: EM | Admit: 2020-08-03 | Discharge: 2020-08-03 | Disposition: A | Discharge status: Home / Self Care | Payer: Medicaid Other | Attending: Emergency Medicine | Admitting: Emergency Medicine

## 2020-08-03 LAB — HCG, QUANTITATIVE, PREGNANCY: hCG, Beta Chain, Quant, S: 1 m[IU]/mL (ref <5)

## 2022-01-08 ENCOUNTER — Other Ambulatory Visit: Payer: Self-pay

## 2022-01-08 ENCOUNTER — Encounter: Payer: Self-pay | Admitting: *Deleted

## 2022-01-08 DIAGNOSIS — R102 Pelvic and perineal pain: Secondary | ICD-10-CM | POA: Insufficient documentation

## 2022-01-08 DIAGNOSIS — Z5321 Procedure and treatment not carried out due to patient leaving prior to being seen by health care provider: Secondary | ICD-10-CM | POA: Insufficient documentation

## 2022-01-08 NOTE — ED Triage Notes (Signed)
Pt reports pelvic pain radiating into back; currently menstruating after no period for 71mo; st hx ovarian cyst/torsion ?

## 2022-01-09 ENCOUNTER — Emergency Department
Admission: EM | Admit: 2022-01-09 | Discharge: 2022-01-09 | Discharge status: Against Medical Advice | Payer: Medicaid Other | Attending: Emergency Medicine | Admitting: Emergency Medicine

## 2022-04-11 ENCOUNTER — Emergency Department: Payer: Medicaid Other

## 2022-04-11 ENCOUNTER — Other Ambulatory Visit: Payer: Self-pay

## 2022-04-11 ENCOUNTER — Emergency Department
Admission: EM | Admit: 2022-04-11 | Discharge: 2022-04-11 | Disposition: A | Discharge status: Home / Self Care | Payer: Medicaid Other | Attending: Emergency Medicine | Admitting: Emergency Medicine

## 2022-04-11 DIAGNOSIS — M25552 Pain in left hip: Secondary | ICD-10-CM | POA: Diagnosis not present

## 2022-04-11 LAB — BASIC METABOLIC PANEL
Anion gap: 7 (ref 5–15)
BUN: 11 mg/dL (ref 6–20)
CO2: 20 mmol/L — ABNORMAL LOW (ref 22–32)
Calcium: 8.8 mg/dL — ABNORMAL LOW (ref 8.9–10.3)
Chloride: 112 mmol/L — ABNORMAL HIGH (ref 98–111)
Creatinine, Ser: 0.72 mg/dL (ref 0.44–1.00)
GFR, Estimated: >60 mL/min (ref >60)
Glucose, Bld: 105 mg/dL — ABNORMAL HIGH (ref 70–99)
Potassium: 4.4 mmol/L (ref 3.5–5.1)
Sodium: 139 mmol/L (ref 135–145)

## 2022-04-11 LAB — CBC WITH DIFFERENTIAL/PLATELET
Abs Immature Granulocytes: 0.03 10*3/uL (ref 0.00–0.07)
Basophils Absolute: 0 10*3/uL (ref 0.0–0.1)
Basophils Relative: 0 %
Eosinophils Absolute: 0.1 10*3/uL (ref 0.0–0.5)
Eosinophils Relative: 1 %
HCT: 47.6 % — ABNORMAL HIGH (ref 36.0–46.0)
Hemoglobin: 15.6 g/dL — ABNORMAL HIGH (ref 12.0–15.0)
Immature Granulocytes: 0 %
Lymphocytes Relative: 25 %
Lymphs Abs: 2.7 10*3/uL (ref 0.7–4.0)
MCH: 29.3 pg (ref 26.0–34.0)
MCHC: 32.8 g/dL (ref 30.0–36.0)
MCV: 89.3 fL (ref 80.0–100.0)
Monocytes Absolute: 0.8 10*3/uL (ref 0.1–1.0)
Monocytes Relative: 7 %
Neutro Abs: 7.5 10*3/uL (ref 1.7–7.7)
Neutrophils Relative %: 67 %
Platelets: 251 10*3/uL (ref 150–400)
RBC: 5.33 MIL/uL — ABNORMAL HIGH (ref 3.87–5.11)
RDW: 12.2 % (ref 11.5–15.5)
WBC: 11.2 10*3/uL — ABNORMAL HIGH (ref 4.0–10.5)
nRBC: 0 % (ref 0.0–0.2)

## 2022-04-11 LAB — POC URINE PREG, ED: Preg Test, Ur: NEGATIVE

## 2022-04-11 MED ORDER — HYDROCODONE-ACETAMINOPHEN 5-325 MG PO TABS: 1.0000 | ORAL_TABLET | Freq: Four times a day (QID) | ORAL | 0 refills | Status: AC | PRN

## 2022-04-11 MED ORDER — CYCLOBENZAPRINE HCL 10 MG PO TABS: 10.0000 mg | ORAL_TABLET | Freq: Three times a day (TID) | ORAL | 0 refills | Status: DC | PRN

## 2022-04-11 MED ORDER — FENTANYL CITRATE PF 50 MCG/ML IJ SOSY
100.0000 ug | PREFILLED_SYRINGE | Freq: Once | INTRAMUSCULAR | Status: AC
  Administered 2022-04-11: 100 ug via INTRAVENOUS
  Filled 2022-04-11: qty 2

## 2022-04-11 MED ORDER — KETOROLAC TROMETHAMINE 30 MG/ML IJ SOLN
15.0000 mg | Freq: Once | INTRAMUSCULAR | Status: AC
  Administered 2022-04-11: 15 mg via INTRAVENOUS
  Filled 2022-04-11: qty 1

## 2022-04-11 MED ORDER — KETOROLAC TROMETHAMINE 10 MG PO TABS: 10.0000 mg | ORAL_TABLET | Freq: Four times a day (QID) | ORAL | 0 refills | Status: DC | PRN

## 2022-04-11 MED ORDER — CYCLOBENZAPRINE HCL 10 MG PO TABS
10.0000 mg | ORAL_TABLET | Freq: Once | ORAL | Status: AC
  Administered 2022-04-11: 10 mg via ORAL
  Filled 2022-04-11: qty 1

## 2022-04-11 MED ORDER — MORPHINE SULFATE (PF) 4 MG/ML IV SOLN
4.0000 mg | Freq: Once | INTRAVENOUS | Status: AC
  Administered 2022-04-11: 4 mg via INTRAVENOUS
  Filled 2022-04-11: qty 1

## 2022-04-11 MED ORDER — ONDANSETRON HCL 4 MG/2ML IJ SOLN
4.0000 mg | Freq: Once | INTRAMUSCULAR | Status: AC
  Administered 2022-04-11: 4 mg via INTRAVENOUS
  Filled 2022-04-11: qty 2

## 2022-04-11 NOTE — ED Provider Notes (Signed)
[facility] Provider Note    Event Date/Time   First MD Initiated Contact with Patient 04/11/22 1227     (approximate)   History   Hip Pain   HPI  Kristi Gutierrez is a 41 y.o. female presents to the emergency department for treatment and evaluation of left hip pain.  Patient states that she has chronic hip pain but today she was bending over in the shower to pick up a bottle of shampoo and felt something pop.  She states that she threw herself into the floor and started calling for her husband.  When he got to her, he was pulling on her left leg and rotating her hip and felt something pop again.  Since that she has been unable to bend at the hip or sit down.  Past Medical History:  Diagnosis Date   Migraine    currently active, uses Excedrin   Pseudotumor cerebri      Physical Exam   Triage Vital Signs: ED Triage Vitals  Enc Vitals Group     BP 04/11/22 1226 98/87     Pulse Rate 04/11/22 1226 95     Resp 04/11/22 1226 (!) 40     Temp 04/11/22 1226 98.5 F (36.9 C)     Temp Source 04/11/22 1226 Oral     SpO2 04/11/22 1226 98 %     Weight 04/11/22 1222 (!) 360 lb (163.3 kg)     Height 04/11/22 1222 '5\' 6"'$  (1.676 m)     Head Circumference --      Peak Flow --      Pain Score 04/11/22 1221 10     Pain Loc --      Pain Edu? --      Excl. in [place]? --     Most recent vital signs: Vitals:   04/11/22 1226  BP: 98/87  Pulse: 95  Resp: (!) 40  Temp: 98.5 F (36.9 C)  SpO2: 98%    General: Awake, no distress.  CV:  Good peripheral perfusion.  Resp:  Normal effort.  Abd:  No distention.  Other:  Focal tenderness in the left hip.  Patient unwilling to attempt to flex at the hip or knee.  No obvious shortening or rotation.   ED Results / Procedures / Treatments   Labs (all labs ordered are listed, but only abnormal results are displayed) Labs Reviewed  CBC WITH DIFFERENTIAL/PLATELET - Abnormal; Notable for the following components:       Result Value   WBC 11.2 (*)    RBC 5.33 (*)    Hemoglobin 15.6 (*)    HCT 47.6 (*)    All other components within normal limits  BASIC METABOLIC PANEL - Abnormal; Notable for the following components:   Chloride 112 (*)    CO2 20 (*)    Glucose, Bld 105 (*)    Calcium 8.8 (*)    All other components within normal limits  POC URINE PREG, ED     EKG  Not indicated   RADIOLOGY  X-ray of the left hip shows mild osteoarthritis however no acute concerns.  CT of the left hip shows no acute concerns.  I have independently reviewed and interpreted imaging as well as reviewed report from radiology.  PROCEDURES:  Critical Care performed: No  Procedures   MEDICATIONS ORDERED IN ED:  Medications  fentaNYL (SUBLIMAZE) injection 100 mcg (100 mcg Intravenous Given 04/11/22 1338)  ondansetron (ZOFRAN) injection 4 mg (4 mg Intravenous  Given 04/11/22 1337)  cyclobenzaprine (FLEXERIL) tablet 10 mg (10 mg Oral Given 04/11/22 1441)  morphine (PF) 4 MG/ML injection 4 mg (4 mg Intravenous Given 04/11/22 1536)  ketorolac (TORADOL) 30 MG/ML injection 15 mg (15 mg Intravenous Given 04/11/22 1649)     IMPRESSION / MDM / ASSESSMENT AND PLAN / ED COURSE   I reviewed the triage vital signs and the nursing notes.  Differential diagnosis includes, but is not limited to: Hip strain, hip fracture  Patient's presentation is most consistent with acute complicated illness / injury requiring diagnostic workup.  41 year old female presenting to the emergency department for treatment and evaluation of acute on chronic left hip pain.  See HPI for further details.  Patient states that she has had 2 separate steroid injections in the left hip due to this pain without any relief.  She is also taken a tapered prednisone pack without improvement.  She states that the pain since bending over to pick up the shampoo in the shower this morning is severe.  Plan will be to get x-ray and give pain  medications.  Patient states that she has had no improvement after the fentanyl.  Pain in the left hip and leg feels like muscle spasms.  Flexeril ordered.  X-rays negative for any acute concerns but does show some mild osteoarthritis.  Because the pain is so severe, plan will be to get CT.  CT of the left hip again shows mild osteoarthritis but no other acute concerns.  Upon reexam, patient continues to complain of focal left hip pain.  Blood work obtained and is overall reassuring.  She does have a very mild leukocytosis of 11.2 with no shift.  Patient able to get out of bed with the assistance of family.  She was able to sit and then rise to standing.  She was able to ambulate to the restroom.  Plan will be to discharge her home with prescription for Flexeril, Toradol, and Norco.  Referral to orthopedics listed on discharge papers.  If not improving over the next couple days, she is to call and schedule an appointment.      FINAL CLINICAL IMPRESSION(S) / ED DIAGNOSES   Final diagnoses:  Pain of left hip     Rx / DC Orders   ED Discharge Orders          Ordered    cyclobenzaprine (FLEXERIL) 10 MG tablet  3 times daily PRN        04/11/22 1734    ketorolac (TORADOL) 10 MG tablet  Every 6 hours PRN       Note to Pharmacy: Injection given in ER   04/11/22 1734    HYDROcodone-acetaminophen (NORCO/VICODIN) 5-325 MG tablet  Every 6 hours PRN        04/11/22 1734             Note:  This document was prepared using Dragon voice recognition software and may include unintentional dictation errors.   [name], FNP 04/11/22 1738    Blake Divine, MD 04/11/22 [phone]

## 2022-04-11 NOTE — ED Triage Notes (Signed)
Pt to ED for L hip pain isnce 1 hour ago. Pt states she has ongoing issues with L hip and gets steroid shots in [facility]. Pt is crying hysterically and hyperventilating. Pt does appear to be in severe pain. States that this morning she felt her hip "pop" twice. Pt does not tolerate movement. Is able to walk. States pain shoots down leg.  States she threw herself onto the ground today because of the pain and landed on her R shoulder which is also painful now.

## 2022-04-11 NOTE — Discharge Instructions (Addendum)
Please call and schedule follow-up appointment with orthopedics if your pain is not improving over the next couple of days.

## 2022-05-14 ENCOUNTER — Inpatient Hospital Stay
Admission: EM | Admit: 2022-05-14 | Discharge: 2022-05-19 | DRG: 552 | Disposition: A | Discharge status: Home / Self Care | Payer: Medicaid Other | Attending: Internal Medicine | Admitting: Internal Medicine

## 2022-05-14 ENCOUNTER — Emergency Department: Payer: Medicaid Other

## 2022-05-14 ENCOUNTER — Encounter: Payer: Self-pay | Admitting: Emergency Medicine

## 2022-05-14 DIAGNOSIS — Z6841 Body Mass Index (BMI) 40.0 and over, adult: Secondary | ICD-10-CM

## 2022-05-14 DIAGNOSIS — M5116 Intervertebral disc disorders with radiculopathy, lumbar region: Principal | ICD-10-CM | POA: Diagnosis present

## 2022-05-14 DIAGNOSIS — M47816 Spondylosis without myelopathy or radiculopathy, lumbar region: Secondary | ICD-10-CM | POA: Diagnosis present

## 2022-05-14 DIAGNOSIS — M5440 Lumbago with sciatica, unspecified side: Secondary | ICD-10-CM

## 2022-05-14 DIAGNOSIS — M5416 Radiculopathy, lumbar region: Secondary | ICD-10-CM | POA: Diagnosis present

## 2022-05-14 DIAGNOSIS — R52 Pain, unspecified: Secondary | ICD-10-CM

## 2022-05-14 DIAGNOSIS — R262 Difficulty in walking, not elsewhere classified: Secondary | ICD-10-CM | POA: Diagnosis present

## 2022-05-14 DIAGNOSIS — G8929 Other chronic pain: Secondary | ICD-10-CM | POA: Diagnosis present

## 2022-05-14 DIAGNOSIS — Y92009 Unspecified place in unspecified non-institutional (private) residence as the place of occurrence of the external cause: Secondary | ICD-10-CM

## 2022-05-14 DIAGNOSIS — R296 Repeated falls: Secondary | ICD-10-CM | POA: Diagnosis present

## 2022-05-14 DIAGNOSIS — W19XXXA Unspecified fall, initial encounter: Secondary | ICD-10-CM

## 2022-05-14 DIAGNOSIS — M5126 Other intervertebral disc displacement, lumbar region: Secondary | ICD-10-CM | POA: Diagnosis present

## 2022-05-14 DIAGNOSIS — Z79899 Other long term (current) drug therapy: Secondary | ICD-10-CM

## 2022-05-14 MED ORDER — FENTANYL CITRATE PF 50 MCG/ML IJ SOSY
50.0000 ug | PREFILLED_SYRINGE | Freq: Once | INTRAMUSCULAR | Status: AC
  Administered 2022-05-14: 50 ug via INTRAMUSCULAR
  Filled 2022-05-14: qty 1

## 2022-05-14 MED ORDER — LORAZEPAM 1 MG PO TABS
0.5000 mg | ORAL_TABLET | Freq: Once | ORAL | Status: AC
  Administered 2022-05-14: 0.5 mg via ORAL
  Filled 2022-05-14: qty 1

## 2022-05-14 MED ORDER — OXYCODONE-ACETAMINOPHEN 5-325 MG PO TABS
1.0000 | ORAL_TABLET | Freq: Once | ORAL | Status: AC
  Administered 2022-05-14: 1 via ORAL
  Filled 2022-05-14: qty 1

## 2022-05-14 MED ORDER — METHYLPREDNISOLONE SODIUM SUCC 125 MG IJ SOLR
125.0000 mg | Freq: Once | INTRAMUSCULAR | Status: AC
  Administered 2022-05-15: 125 mg via INTRAVENOUS
  Filled 2022-05-14: qty 2

## 2022-05-14 NOTE — ED Provider Notes (Signed)
[facility] Provider Note  Patient Contact: 10:55 PM (approximate)   History   Back Pain   HPI  Kristi Gutierrez is a 41 y.o. female presents to the emergency department with low back pain on the left that radiates down the posterior aspect of her left leg.  Patient is also complaining of leg weakness.  Patient states that she has been falling at home and cannot bear weight on the left.  She denies bowel or bladder incontinence or saddle anesthesia.  She states that she was seen for similar symptoms in July at [facility] but states that her pain has significantly worsened.      Physical Exam   Triage Vital Signs: ED Triage Vitals  Enc Vitals Group     BP 05/14/22 2123 118/72     Pulse Rate 05/14/22 2123 (!) 102     Resp 05/14/22 2123 16     Temp 05/14/22 2123 98.2 F (36.8 C)     Temp Source 05/14/22 2123 Oral     SpO2 05/14/22 2123 98 %     Weight 05/14/22 2122 (!) 346 lb (156.9 kg)     Height 05/14/22 2122 '5\' 6"'$  (1.676 m)     Head Circumference --      Peak Flow --      Pain Score 05/14/22 2122 8     Pain Loc --      Pain Edu? --      Excl. in [place]? --     Most recent vital signs: Vitals:   05/14/22 2123 05/14/22 2331  BP: 118/72 122/78  Pulse: (!) 102 99  Resp: 16 18  Temp: 98.2 F (36.8 C)   SpO2: 98% 98%     General: Alert and in no acute distress. Eyes:  PERRL. EOMI. Head: No acute traumatic findings ENT:      Nose: No congestion/rhinnorhea.      Mouth/Throat: Mucous membranes are moist. Neck: No stridor. No cervical spine tenderness to palpation. Cardiovascular:  Good peripheral perfusion Respiratory: Normal respiratory effort without tachypnea or retractions. Lungs CTAB. Good air entry to the bases with no decreased or absent breath sounds. Gastrointestinal: Bowel sounds 4 quadrants. Soft and nontender to palpation. No guarding or rigidity. No palpable masses. No distention. No CVA tenderness. Musculoskeletal: Patient has  left great toe weakness on exam with resisted extension.  Palpable dorsalis pedis pulse, left. Neurologic: Patient has diminished sensation along the dorsal aspect of the left foot and the lateral aspect of the left leg in the distribution of L5-S1. Skin:   No rash noted Other:   ED Results / Procedures / Treatments   Labs (all labs ordered are listed, but only abnormal results are displayed) Labs Reviewed  POC URINE PREG, ED        RADIOLOGY  I personally viewed and evaluated these images as part of my medical decision making, as well as reviewing the written report by the radiologist.  ED Provider Interpretation: Large subarticular disc extrusion at L4-L5.  MRI results tonight consistent with prior MRI obtained on 8/7.   PROCEDURES:  Critical Care performed: No  Procedures   MEDICATIONS ORDERED IN ED: Medications  oxyCODONE-acetaminophen (PERCOCET/ROXICET) 5-325 MG per tablet 1 tablet (1 tablet Oral Given 05/14/22 2128)  LORazepam (ATIVAN) tablet 0.5 mg (0.5 mg Oral Given 05/14/22 2300)  fentaNYL (SUBLIMAZE) injection 50 mcg (50 mcg Intramuscular Given 05/14/22 2327)     IMPRESSION / MDM / ASSESSMENT AND PLAN / ED COURSE  I reviewed the triage vital signs and the nursing notes.                              Assessment and plan Low back pain Leg weakness 41 year old female presents to the emergency department with low back pain that radiates down the left leg and leg weakness as well as frequent falls.  Patient has been symptomatic since July.  Patient was mildly tachycardic at triage but vital signs were otherwise reassuring.  Patient was alert and nontoxic-appearing.  She did have left great toe weakness with resisted extension and diminished sensation along the dorsal aspect of the left foot and the lateral left leg in the distribution of L5-S1.  Patient reports that her symptoms such as left leg weakness has worsened since MRI obtained on 8/7.  We will proceed  with repeat MRI of the lumbar spine given aforementioned findings.   MRI results consistent with MRI obtained on 8/7.  Will admit to the hospital under the care of Dr. Damita Dunnings for pain management.  FINAL CLINICAL IMPRESSION(S) / ED DIAGNOSES   Final diagnoses:  Acute left-sided low back pain with sciatica, sciatica laterality unspecified     Rx / DC Orders   ED Discharge Orders     None        Note:  This document was prepared using Dragon voice recognition software and may include unintentional dictation errors.   Vallarie Mare [place], Hershal Coria 05/14/22 2339    Duffy Bruce, MD 05/14/22 2341    Lannie Fields, PA-C 05/15/22 1507    Duffy Bruce, MD 05/16/22 1049

## 2022-05-14 NOTE — ED Notes (Signed)
Pt denies need to urinate at this time.

## 2022-05-14 NOTE — ED Triage Notes (Signed)
Pt presents via POV with complaints of lower back pain following a fall earlier today. She notes slipping and landing on her left knee on wooden floors. Of note, the patient has a hx of lumbar disc herniation. Denies hitting her head nor LOC.

## 2022-05-15 ENCOUNTER — Observation Stay: Payer: Medicaid Other | Admitting: Radiology

## 2022-05-15 DIAGNOSIS — W19XXXA Unspecified fall, initial encounter: Secondary | ICD-10-CM

## 2022-05-15 DIAGNOSIS — M5416 Radiculopathy, lumbar region: Secondary | ICD-10-CM | POA: Diagnosis present

## 2022-05-15 DIAGNOSIS — R262 Difficulty in walking, not elsewhere classified: Secondary | ICD-10-CM | POA: Diagnosis present

## 2022-05-15 DIAGNOSIS — G43909 Migraine, unspecified, not intractable, without status migrainosus: Secondary | ICD-10-CM | POA: Insufficient documentation

## 2022-05-15 DIAGNOSIS — R52 Pain, unspecified: Secondary | ICD-10-CM

## 2022-05-15 DIAGNOSIS — M5126 Other intervertebral disc displacement, lumbar region: Secondary | ICD-10-CM | POA: Diagnosis present

## 2022-05-15 HISTORY — PX: IR INJECT/THERA/INC NEEDLE/CATH/PLC EPI/LUMB/SAC W/IMG: IMG6130

## 2022-05-15 MED ORDER — KETOROLAC TROMETHAMINE 30 MG/ML IJ SOLN
30.0000 mg | Freq: Four times a day (QID) | INTRAMUSCULAR | Status: DC | PRN
Start: 2022-05-15 — End: 2022-05-15
  Administered 2022-05-15 (×2): 30 mg via INTRAVENOUS
  Filled 2022-05-15 (×2): qty 1

## 2022-05-15 MED ORDER — GABAPENTIN 300 MG PO CAPS: 300.0000 mg | ORAL_CAPSULE | Freq: Three times a day (TID) | ORAL | Status: DC

## 2022-05-15 MED ORDER — IOHEXOL 180 MG/ML  SOLN
2.0000 mL | Freq: Once | INTRAMUSCULAR | Status: AC | PRN
  Administered 2022-05-15: 2 mL via INTRATHECAL

## 2022-05-15 MED ORDER — PREGABALIN 75 MG PO CAPS
75.0000 mg | ORAL_CAPSULE | Freq: Two times a day (BID) | ORAL | Status: DC
  Administered 2022-05-15 – 2022-05-17 (×4): 75 mg via ORAL
  Filled 2022-05-15 (×4): qty 1

## 2022-05-15 MED ORDER — DEXAMETHASONE SODIUM PHOSPHATE 10 MG/ML IJ SOLN
8.0000 mg | Freq: Two times a day (BID) | INTRAMUSCULAR | Status: DC
  Administered 2022-05-15: 8 mg via INTRAVENOUS
  Filled 2022-05-15: qty 1

## 2022-05-15 MED ORDER — METHYLPREDNISOLONE ACETATE 80 MG/ML IJ SUSP
INTRAMUSCULAR | Status: AC
  Administered 2022-05-15: 80 mg
  Filled 2022-05-15: qty 1

## 2022-05-15 MED ORDER — ACETAMINOPHEN 500 MG PO TABS
1000.0000 mg | ORAL_TABLET | Freq: Three times a day (TID) | ORAL | Status: DC
  Administered 2022-05-15 – 2022-05-19 (×11): 1000 mg via ORAL
  Filled 2022-05-15 (×11): qty 2

## 2022-05-15 MED ORDER — LIDOCAINE HCL (PF) 1 % IJ SOLN
INTRAMUSCULAR | Status: AC
  Administered 2022-05-15: 8 mL
  Filled 2022-05-15: qty 30

## 2022-05-15 MED ORDER — GABAPENTIN 300 MG PO CAPS
300.0000 mg | ORAL_CAPSULE | Freq: Once | ORAL | Status: AC
  Administered 2022-05-15: 300 mg via ORAL
  Filled 2022-05-15: qty 1

## 2022-05-15 MED ORDER — ONDANSETRON HCL 4 MG/2ML IJ SOLN: 4.0000 mg | Freq: Four times a day (QID) | INTRAMUSCULAR | Status: DC | PRN

## 2022-05-15 MED ORDER — NAPROXEN 500 MG PO TABS
500.0000 mg | ORAL_TABLET | Freq: Two times a day (BID) | ORAL | Status: DC
  Administered 2022-05-15 – 2022-05-19 (×8): 500 mg via ORAL
  Filled 2022-05-15 (×9): qty 1

## 2022-05-15 MED ORDER — GABAPENTIN 300 MG PO CAPS
300.0000 mg | ORAL_CAPSULE | Freq: Three times a day (TID) | ORAL | Status: DC
  Administered 2022-05-15: 300 mg via ORAL
  Filled 2022-05-15: qty 1

## 2022-05-15 MED ORDER — HYDROCODONE-ACETAMINOPHEN 5-325 MG PO TABS
1.0000 | ORAL_TABLET | ORAL | Status: DC | PRN
  Administered 2022-05-15: 1 via ORAL
  Administered 2022-05-15: 2 via ORAL
  Filled 2022-05-15: qty 2
  Filled 2022-05-15: qty 1

## 2022-05-15 MED ORDER — CYCLOBENZAPRINE HCL 10 MG PO TABS
5.0000 mg | ORAL_TABLET | Freq: Three times a day (TID) | ORAL | Status: DC | PRN
Start: 2022-05-15 — End: 2022-05-17
  Administered 2022-05-15 – 2022-05-17 (×4): 5 mg via ORAL
  Filled 2022-05-15 (×4): qty 1

## 2022-05-15 MED ORDER — MORPHINE SULFATE (PF) 2 MG/ML IV SOLN
2.0000 mg | INTRAVENOUS | Status: DC | PRN
  Administered 2022-05-15 – 2022-05-18 (×19): 2 mg via INTRAVENOUS
  Filled 2022-05-15 (×19): qty 1

## 2022-05-15 MED ORDER — ACETAMINOPHEN 650 MG RE SUPP: 650.0000 mg | Freq: Four times a day (QID) | RECTAL | Status: DC | PRN

## 2022-05-15 MED ORDER — OXYCODONE HCL 5 MG PO TABS
5.0000 mg | ORAL_TABLET | Freq: Four times a day (QID) | ORAL | Status: DC | PRN
  Administered 2022-05-16 – 2022-05-17 (×4): 5 mg via ORAL
  Filled 2022-05-15 (×4): qty 1

## 2022-05-15 MED ORDER — STERILE WATER FOR INJECTION IJ SOLN
INTRAMUSCULAR | Status: AC
  Administered 2022-05-15: 2 mL
  Filled 2022-05-15: qty 10

## 2022-05-15 MED ORDER — ACETAMINOPHEN 325 MG PO TABS: 650.0000 mg | ORAL_TABLET | Freq: Four times a day (QID) | ORAL | Status: DC | PRN

## 2022-05-15 MED ORDER — ONDANSETRON HCL 4 MG PO TABS: 4.0000 mg | ORAL_TABLET | Freq: Four times a day (QID) | ORAL | Status: DC | PRN

## 2022-05-15 NOTE — Assessment & Plan Note (Addendum)
Body mass index is 55.85 kg/m. Complicates overall care and prognosis.  Recommend lifestyle modifications including physical activity and diet for weight loss and overall long-term health.

## 2022-05-15 NOTE — ED Notes (Addendum)
Attempted IV twice unsuccessfully.  

## 2022-05-15 NOTE — Progress Notes (Signed)
  Progress Note   Patient: Kristi Gutierrez BXU:[id number] DOB: 1981-08-15 DOA: 05/14/2022     0 DOS: the patient was seen and examined on 05/15/2022        Brief hospital course: 41 yo F with obesity and known radiculopathy failed outpatient opiates, steroids, gabapentin and presented with inability to walk.  This is a no charge note, for further details, please see H&P by my partner Dr. Damita Dunnings from earlier today.       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  Start Aleve Stop systemic steroids as these have not helped twice in the past for her Change gabapentin to Lyrica Schedule acetaminophen Continue opiates and titrate as needed  Discussed with Neurosurgery not a surgical candidate  Discussed with IR, they will attempt ESI and nerve block today  PT/OT     Morbid obesity with BMI of 50.0-59.9, adult [facility])                  Physical Exam: Vitals:   05/15/22 1210 05/15/22 1500 05/15/22 1604 05/15/22 1857  BP:   (!) 111/56 101/76  Pulse: 94 96 96 97  Resp:   18 20  Temp:   98 F (36.7 C) 97.9 F (36.6 C)  TempSrc:   Oral   SpO2: 96% 98% 97% 96%  Weight:      Height:              Disposition: Status is: Observation         Author: Edwin Dada, MD 05/15/2022 7:16 PM  For on call review [email or web address].

## 2022-05-15 NOTE — ED Notes (Signed)
Patient assisted up to bathroom in wheelchair. Continent of urine. Assisted back to bed, patient now in hospital bed for comfort.

## 2022-05-15 NOTE — ED Notes (Signed)
Vascular Access RN at bedside at this time.

## 2022-05-15 NOTE — Assessment & Plan Note (Addendum)
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

## 2022-05-15 NOTE — H&P (Signed)
History and Physical    Patient: Kristi Gutierrez PJA:[id number] DOB: 04-25-1981 DOA: 05/14/2022 DOS: the patient was seen and examined on 05/15/2022 PCP: Lorelee Market, MD  Patient coming from: Home  Chief Complaint:  Chief Complaint  Patient presents with   Back Pain    HPI: Kristi Gutierrez is a 41 y.o. female with medical history significant for Morbid obesity, BMI 50-59, known herniated disc followed by PCP who presents to the ED with acute on chronic low back pain following a fall earlier in the day.  The pain is of severe intensity and is radiating down the back of the left leg causing difficulty with ambulation.  She has numbness and weakness of the left leg with a pins-and-needles sensation in the leg..  She denies bladder or bowel retention on continence or numbness  in the groin area.  Active straight leg raise about 30 degrees.  She denies other injuries related to the fall and has had prior falls related to weakness in her legs.  Outpatient referral to neurosurgery was being worked on by her PCP ED course and data review: Vitals unremarkable except for borderline tachycardia of 99-1 02 .  MRI showed the following: IMPRESSION: 1. Large left subarticular disc extrusion with superior migration at L4-L5 may affect the left L4 or L5 nerve roots  MRI at [facility] on 8/7 that showed a similar finding as follows: L4-L5: Bulging disc and large left subarticular/partially foraminal disc extrusion effacing the left lateral recess and compressing the left-sided traversing nerve roots, contributing to severe left neural foraminal narrowing. Findings are compounded by mild facet arthrosis and endplate osteophytes. Mild right neural foraminal narrowing   Patient treated with Solu-Medrol and Percocet, fentanyl, Ativan.  Hospitalist consulted for admission for pain control.    Past Medical History:  Diagnosis Date   Migraine    currently active, uses Excedrin   Pseudotumor cerebri    Past  Surgical History:  Procedure Laterality Date   ATTEMPTED BTL-HAD TO ABORT PROCEDURE  10-2014   CYST EXCISION     EXCISION MASS UPPER EXTREMETIES Right 03/14/2018   Procedure: EXCISION ELBOW MASS;  Surgeon: Leanor Kail, MD;  Location: [facility];  Service: Orthopedics;  Laterality: Right;   GANGLION CYST EXCISION Left 12/26/2015   Procedure: REMOVAL GANGLION OF WRIST;  Surgeon: Earnestine Leys, MD;  Location: [facility];  Service: Orthopedics;  Laterality: Left;   GANGLION CYST EXCISION Left 04/20/2016   Procedure: REMOVAL GANGLION OF WRIST;  Surgeon: Leanor Kail, MD;  Location: [facility];  Service: Orthopedics;  Laterality: Left;   LAPAROSCOPIC OVARIAN CYSTECTOMY Left 04/14/2018   Procedure: LAPAROSCOPIC OVARIAN CYSTECTOMY;  Surgeon: Homero Fellers, MD;  Location: [facility];  Service: Gynecology;  Laterality: Left;  peritubal cyst   LAPAROSCOPIC TUBAL LIGATION Bilateral 03/01/2015   Procedure: LAPAROSCOPIC TUBAL LIGATION;  Surgeon: Will Bonnet, MD;  Location: [facility];  Service: Gynecology;  Laterality: Bilateral;   LAPAROSCOPIC UNILATERAL SALPINGECTOMY Left 04/14/2018   Procedure: LAPAROSCOPIC UNILATERAL SALPINGECTOMY;  Surgeon: Homero Fellers, MD;  Location: [facility];  Service: Gynecology;  Laterality: Left;   MOUTH SURGERY     TUBAL LIGATION     Social History:  reports that she has never smoked. She has never used smokeless tobacco. She reports current alcohol use. She reports that she does not use drugs.  No Known Allergies  Family History  Problem Relation Age of Onset   Arthritis Mother    Diabetes Mother        type 2  Breast cancer Sister 11       pat 1/2 sister    Prior to Admission medications   Medication Sig Start Date End Date Taking? Authorizing Provider  cyclobenzaprine (FLEXERIL) 10 MG tablet Take 1 tablet (10 mg total) by mouth 3 (three) times daily as needed. 04/11/22   Triplett, Cari B, FNP  docusate sodium (COLACE) 100 MG capsule Take 2 capsules  (200 mg total) by mouth 2 (two) times daily. 11/17/18   Carrie Mew, MD  ketorolac (TORADOL) 10 MG tablet Take 1 tablet (10 mg total) by mouth every 6 (six) hours as needed. 04/11/22   Triplett, Dessa Phi, FNP  Witch Reghan Thul (TUCKS) 50 % PADS Apply 1 application topically every 2 (two) hours as needed. 11/17/18   Carrie Mew, MD    Physical Exam: Vitals:   05/14/22 2122 05/14/22 2123 05/14/22 2331  BP:  118/72 122/78  Pulse:  (!) 102 99  Resp:  16 18  Temp:  98.2 F (36.8 C)   TempSrc:  Oral   SpO2:  98% 98%  Weight: (!) 156.9 kg    Height: '5\' 6"'$  (1.676 m)     Physical Exam Vitals and nursing note reviewed.  Constitutional:      General: She is not in acute distress.    Comments: Pain distress.  Crying  HENT:     Head: Normocephalic and atraumatic.  Cardiovascular:     Rate and Rhythm: Normal rate and regular rhythm.     Heart sounds: Normal heart sounds.  Pulmonary:     Effort: Pulmonary effort is normal.     Breath sounds: Normal breath sounds.  Abdominal:     Palpations: Abdomen is soft.     Tenderness: There is no abdominal tenderness.  Musculoskeletal:     Comments: Positive SLR at 30 degrees.  Pain on palpation over low back  Neurological:     Mental Status: Mental status is at baseline.     Labs on Admission: I have personally reviewed following labs and imaging studies  CBC: No results for input(s): "WBC", "NEUTROABS", "HGB", "HCT", "MCV", "PLT" in the last 168 hours. Basic Metabolic Panel: No results for input(s): "NA", "K", "CL", "CO2", "GLUCOSE", "BUN", "CREATININE", "CALCIUM", "MG", "PHOS" in the last 168 hours. GFR: CrCl cannot be calculated (Patient's most recent lab result is older than the maximum 21 days allowed.). Liver Function Tests: No results for input(s): "AST", "ALT", "ALKPHOS", "BILITOT", "PROT", "ALBUMIN" in the last 168 hours. No results for input(s): "LIPASE", "AMYLASE" in the last 168 hours. No results for input(s): "AMMONIA" in the  last 168 hours. Coagulation Profile: No results for input(s): "INR", "PROTIME" in the last 168 hours. Cardiac Enzymes: No results for input(s): "CKTOTAL", "CKMB", "CKMBINDEX", "TROPONINI" in the last 168 hours. BNP (last 3 results) No results for input(s): "PROBNP" in the last 8760 hours. HbA1C: No results for input(s): "HGBA1C" in the last 72 hours. CBG: No results for input(s): "GLUCAP" in the last 168 hours. Lipid Profile: No results for input(s): "CHOL", "HDL", "LDLCALC", "TRIG", "CHOLHDL", "LDLDIRECT" in the last 72 hours. Thyroid Function Tests: No results for input(s): "TSH", "T4TOTAL", "FREET4", "T3FREE", "THYROIDAB" in the last 72 hours. Anemia Panel: No results for input(s): "VITAMINB12", "FOLATE", "FERRITIN", "TIBC", "IRON", "RETICCTPCT" in the last 72 hours. Urine analysis:    Component Value Date/Time   COLORURINE YELLOW (A) 11/16/2018 2325   APPEARANCEUR CLEAR (A) 11/16/2018 2325   APPEARANCEUR Clear 09/01/2014 1615   LABSPEC 1.023 11/16/2018 2325   LABSPEC 1.029 09/01/2014  [place] 6.0 11/16/2018 2325   GLUCOSEU NEGATIVE 11/16/2018 2325   GLUCOSEU Negative 09/01/2014 1615   HGBUR MODERATE (A) 11/16/2018 2325   BILIRUBINUR NEGATIVE 11/16/2018 2325   BILIRUBINUR Negative 09/01/2014 1615   KETONESUR NEGATIVE 11/16/2018 2325   PROTEINUR NEGATIVE 11/16/2018 2325   UROBILINOGEN 0.2 05/20/2015 0046   NITRITE NEGATIVE 11/16/2018 2325   LEUKOCYTESUR TRACE (A) 11/16/2018 2325   LEUKOCYTESUR Trace 09/01/2014 1615    Radiological Exams on Admission: MR LUMBAR SPINE WO CONTRAST  Result Date: 05/15/2022 CLINICAL DATA:  Low back pain.  Fall. EXAM: MRI LUMBAR SPINE WITHOUT CONTRAST TECHNIQUE: Multiplanar, multisequence MR imaging of the lumbar spine was performed. No intravenous contrast was administered. COMPARISON:  None Available. FINDINGS: Segmentation:  Standard. Alignment:  Physiologic. Vertebrae:  No fracture, evidence of discitis, or bone lesion. Conus  medullaris and cauda equina: Conus extends to the L2 level. Conus and cauda equina appear normal. Paraspinal and other soft tissues: Negative. Disc levels: L1-L2: Normal disc space and facet joints. No spinal canal stenosis. No neural foraminal stenosis. L2-L3: Normal disc space and facet joints. No spinal canal stenosis. No neural foraminal stenosis. L3-L4: Normal disc space and facet joints. No spinal canal stenosis. No neural foraminal stenosis. L4-L5: Large left subarticular disc extrusion with superior migration. Left lateral recess narrowing without central spinal canal stenosis. Moderate left neural foraminal stenosis. L5-S1: Normal disc space and facet joints. No spinal canal stenosis. No neural foraminal stenosis. Visualized sacrum: Normal. IMPRESSION: 1. Large left subarticular disc extrusion with superior migration at L4-L5 may affect the left L4 or L5 nerve roots. Electronically Signed   By: Ulyses Jarred M.D.   On: 05/15/2022 00:04     Data Reviewed: Relevant notes from primary care and specialist visits, past discharge summaries as available in EHR, including Care Everywhere. 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 Accidental fall with frequent falls Ambulatory dysfunction Pain control with NSAIDs, narcotics for breakthrough Systemic steroids Physical therapy evaluation Fall precautions Consider IR consult for epidural/transforaminal steroid injection No cord compression seen on MRI but can consider neurosurgery consult inpatient or outpatient referral  Morbid obesity with BMI of 50.0-59.9, adult ([place]) Complicating factor to  overall prognosis and care Consider dietary consult while in-house        DVT prophylaxis: Lovenox  Consults: none  Advance Care Planning:   Code Status: Prior   Family Communication: none  Disposition Plan: Back to previous home environment  Severity of Illness: The appropriate patient status for this patient is OBSERVATION. Observation status is judged to be reasonable and necessary in order to provide the required intensity of service to ensure the patient's safety. The patient's presenting symptoms, physical exam findings, and initial radiographic and laboratory data in the context of their medical condition is felt to place them at decreased risk for further clinical deterioration. Furthermore, it is anticipated that the patient will be medically stable for discharge from the hospital within 2 midnights of admission.   Author: Athena Masse, MD 05/15/2022 1:03 AM  For on call review [email or web address].

## 2022-05-16 ENCOUNTER — Encounter: Payer: Self-pay | Admitting: Internal Medicine

## 2022-05-16 ENCOUNTER — Other Ambulatory Visit: Payer: Self-pay

## 2022-05-16 DIAGNOSIS — R52 Pain, unspecified: Secondary | ICD-10-CM | POA: Diagnosis present

## 2022-05-16 DIAGNOSIS — R296 Repeated falls: Secondary | ICD-10-CM | POA: Diagnosis present

## 2022-05-16 DIAGNOSIS — Z79899 Other long term (current) drug therapy: Secondary | ICD-10-CM | POA: Diagnosis not present

## 2022-05-16 DIAGNOSIS — Z6841 Body Mass Index (BMI) 40.0 and over, adult: Secondary | ICD-10-CM | POA: Diagnosis not present

## 2022-05-16 DIAGNOSIS — G8929 Other chronic pain: Secondary | ICD-10-CM | POA: Diagnosis present

## 2022-05-16 DIAGNOSIS — M47816 Spondylosis without myelopathy or radiculopathy, lumbar region: Secondary | ICD-10-CM | POA: Diagnosis present

## 2022-05-16 DIAGNOSIS — M5116 Intervertebral disc disorders with radiculopathy, lumbar region: Secondary | ICD-10-CM | POA: Diagnosis present

## 2022-05-16 LAB — CBC
HCT: 36.2 % (ref 36.0–46.0)
Hemoglobin: 12.3 g/dL (ref 12.0–15.0)
MCH: 30 pg (ref 26.0–34.0)
MCHC: 34 g/dL (ref 30.0–36.0)
MCV: 88.3 fL (ref 80.0–100.0)
Platelets: 218 10*3/uL (ref 150–400)
RBC: 4.1 MIL/uL (ref 3.87–5.11)
RDW: 12.2 % (ref 11.5–15.5)
WBC: 19.2 10*3/uL — ABNORMAL HIGH (ref 4.0–10.5)
nRBC: 0 % (ref 0.0–0.2)

## 2022-05-16 LAB — BASIC METABOLIC PANEL
Anion gap: 6 (ref 5–15)
BUN: 13 mg/dL (ref 6–20)
CO2: 22 mmol/L (ref 22–32)
Calcium: 8.9 mg/dL (ref 8.9–10.3)
Chloride: 110 mmol/L (ref 98–111)
Creatinine, Ser: 0.71 mg/dL (ref 0.44–1.00)
GFR, Estimated: >60 mL/min (ref >60)
Glucose, Bld: 226 mg/dL — ABNORMAL HIGH (ref 70–99)
Potassium: 4 mmol/L (ref 3.5–5.1)
Sodium: 138 mmol/L (ref 135–145)

## 2022-05-16 LAB — HIV ANTIBODY (ROUTINE TESTING W REFLEX): HIV Screen 4th Generation wRfx: NONREACTIVE

## 2022-05-16 NOTE — Progress Notes (Signed)
    Durable Medical Equipment  (From admission, onward)           Start     Ordered   05/16/22 1500  For home use only DME lightweight manual wheelchair with seat cushion  Once       Comments: Patient suffers from herniated disc which impairs their ability to perform daily activities like dressing and grooming in the home.  A cane will not resolve  issue with performing activities of daily living. A wheelchair will allow patient to safely perform daily activities. Patient is not able to propel themselves in the home using a standard weight wheelchair due to endurance. Patient can self propel in the lightweight wheelchair. Length of need 12 months . Accessories: elevating leg rests (ELRs), wheel locks, extensions and anti-tippers.   05/16/22 1501   05/16/22 1421  For home use only DME Shower stool  Once       Comments: Bariatric   05/16/22 1421   05/16/22 1421  For home use only DME Walker rolling  Once       Comments: Bariatric  Question Answer Comment  Walker: With Raymond   Patient needs a walker to treat with the following condition Weakness      05/16/22 1421

## 2022-05-16 NOTE — Progress Notes (Cosign Needed Addendum)
Due to patient's body habitus patient requires bariatric RW and shower seat

## 2022-05-16 NOTE — Hospital Course (Addendum)
41 year old female with history of morbid obesity BMI 55, known herniated disc followed by PCP at [facility] presented with acute on chronic low back pain following a fall earlier in the day.  She reported pain very severe in intensity and radiating down to the back of the left leg causing difficulty with ambulation.  She has numbness and weakness of the left leg with a pins-and-needles sensation in the leg, loss of sensation in the foot.  No bladder or bowel retention on continence or numbness in the groin area.  Active straight leg raise about 30 degrees.  In ED: MRI -- "1. Large left subarticular disc extrusion with superior migration at L4-L5 may affect the left L4 or L5 nerve roots. MRI at [facility] on 8/7 that showed a similar finding as follows: L4-L5: Bulging disc and large left subarticular/partially foraminal disc extrusion effacing the left lateral recess and compressing the left-sided traversing nerve roots, contributing to severe left neural foraminal narrowing. Findings are compounded by mild facet arthrosis and endplate osteophytes. Mild right neural foraminal narrowing."  Patient was treated with Solu-Medrol and Percocet, fentanyl, Ativan.  Hospitalist consulted for admission for pain control.  Neurosurgeon was consulted by Dr. Karolee Stamps not a surgical candidate.  IR was consulted and performed ESI and nerve block on 8/22.    PT evaluated, recommended Outpatient PT after discharge. DME has been ordered including wheelchair, rolling walker and shower stool.  Seen by neurosurgery, recommended to give injection more time for improvement, continue supportive care.  No emergent indication for surgery at this time.   Pain management was challenging and patient reported minimal improvement on multi-modal regimen of medications.   She has improved to no longer requiring IV pain medications. Stable and agreeable to discharge home with outpatient follow up.

## 2022-05-16 NOTE — Evaluation (Signed)
Occupational Therapy Evaluation Patient Details Name: Kristi Gutierrez MRN: [id number] DOB: 1981/07/06 Today's Date: 05/16/2022   History of Present Illness 41 y.o. female with medical history significant for Morbid obesity, BMI 50-59, known herniated disc followed by PCP who presents to the ED with acute on chronic low back pain following a fall earlier in the day.  The pain is of severe intensity and is radiating down the back of the left leg causing difficulty with ambulation.  She has numbness and weakness of the left leg with a pins-and-needles sensation in the leg..  She denies bladder or bowel retention on continence or numbness  in the groin area.  Active straight leg raise about 30 degrees.  She denies other injuries related to the fall and has had prior falls related to weakness in her legs.   Clinical Impression   Upon entering the room, pt supine in bed and agreeable to OT intervention. She did request morphine from RN before she was agreeable to attempt mobility tasks. Pt reports living at home with husband and 2 children (ages 6-23). 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 Pt endorses having difficulty with ADLs but able to perform with increased time. Her family assists with IADL tasks. OT did discuss some techniques for self care tasks to decrease back pain. Pt stands and ambulates with heavy use of UEs on RW and step to gait pattern 10' into bathroom for toileting needs. Pt is able to dress self while seated on toilet with no assistance other than min guard to stand from commode. Pt has been bothering friends RW and she needs one appropriate for her size for safety. OT discussed recommendation for no OT follow up at discharge and pt agrees. Pt stood at sink with B UEs unsupported and then ambulates back to bed with supervision. OT to sign off at this time.      Recommendations for follow up therapy are one component of a  multi-disciplinary discharge planning process, led by the attending physician.  Recommendations may be updated based on patient status, additional functional criteria and insurance authorization.   Follow Up Recommendations  No OT follow up    Assistance Recommended at Discharge Set up Supervision/Assistance  Patient can return home with the following A little help with walking and/or transfers;A little help with bathing/dressing/bathroom;Help with stairs or ramp for entrance;Assist for transportation    Functional Status Assessment  Patient has had a recent decline in their functional status and demonstrates the ability to make significant improvements in function in a reasonable and predictable amount of time.  Equipment Recommendations  Other (comment);Tub/shower bench (Bariatric RW)       Precautions / Restrictions Precautions Precautions: Fall Restrictions Weight Bearing Restrictions: No      Mobility Bed Mobility Overal bed mobility: Modified Independent             General bed mobility comments: cuing for log roll technique    Transfers Overall transfer level: Needs assistance Equipment used: Rolling walker (2 wheels) Transfers: Sit to/from Stand, Bed to chair/wheelchair/BSC Sit to Stand: Min guard Stand pivot transfers: Min guard                Balance Overall balance assessment: Needs assistance Sitting-balance support: Feet supported Sitting balance-Leahy Scale: Good     Standing balance support: Reliant on assistive device for balance, During functional activity, Bilateral upper extremity supported Standing balance-Leahy Scale:  Fair                             ADL either performed or assessed with clinical judgement   ADL Overall ADL's : Modified independent                                       General ADL Comments: Pt donning clothing items while seated on commode without assistance from therapist.     Vision  Patient Visual Report: No change from baseline              Pertinent Vitals/Pain Pain Assessment Pain Assessment: 0-10 Pain Score: 9  Pain Location: back Pain Descriptors / Indicators: Discomfort, Guarding, Crying Pain Intervention(s): Monitored during session, Premedicated before session, Repositioned     Hand Dominance     Extremity/Trunk Assessment Upper Extremity Assessment Upper Extremity Assessment: Overall WFL for tasks assessed   Lower Extremity Assessment Lower Extremity Assessment: LLE deficits/detail LLE Deficits / Details: Pt reports numbness and "pins and needles" sensation down L LE and into foot.       Communication Communication Communication: No difficulties   Cognition Arousal/Alertness: Awake/alert Behavior During Therapy: WFL for tasks assessed/performed Overall Cognitive Status: Within Functional Limits for tasks assessed                                                  Home Living Family/patient expects to be discharged to:: Private residence Living Arrangements: Spouse/significant other;Children Available Help at Discharge: Family;Available PRN/intermittently Type of Home: House Home Access: Stairs to enter [employer] of Steps: 3 Entrance Stairs-Rails: Right;Left;Can reach both Home Layout: One level     Bathroom Shower/Tub: Teacher, early years/pre: Standard     Home Equipment: Grab bars - toilet;Grab bars - tub/shower          Prior Functioning/Environment Prior Level of Function : Needs assist             Mobility Comments: Pt ambulates with a RW she has borrowed from friend and has had multiple falls in the last 6 months. ADLs Comments: Pt reports driving and going to work each day. She performs self care tasks I with some help for shoes if needed. Family performs IADL tasks.                 OT Goals(Current goals can be found in the care plan section) Acute Rehab OT  Goals Patient Stated Goal: to return home OT Goal Formulation: With patient Time For Goal Achievement: 05/16/22 Potential to Achieve Goals: Good  OT Frequency:         AM-PAC OT "6 Clicks" Daily Activity     Outcome Measure Help from another person eating meals?: None Help from another person taking care of personal grooming?: None Help from another person toileting, which includes using toliet, bedpan, or urinal?: None Help from another person bathing (including washing, rinsing, drying)?: A Little Help from another person to put on and taking off regular upper body clothing?: None Help from another person to put on and taking off regular lower body clothing?: A Little 6 Click Score: 22   End of Session Equipment Utilized During Treatment: Rolling walker (2 wheels)  Nurse Communication: Mobility status;Other (comment) (removal of purewick)  Activity Tolerance: Patient limited by pain Patient left: in bed;with call bell/phone within reach;with bed alarm set                   Time: 2493-2419 OT Time Calculation (min): 45 min Charges:  OT General Charges $OT Visit: 1 Visit OT Evaluation $OT Eval Moderate Complexity: 1 Mod OT Treatments $Self Care/Home Management : 8-22 mins  Darleen Crocker, MS, OTR/L , CBIS ascom [phone]  05/16/22, 11:46 AM

## 2022-05-16 NOTE — Discharge Summary (Deleted)
PROGRESS NOTE Kristi Gutierrez  LPF:[id number] DOB: 06/11/1981 DOA: 05/14/2022 PCP: Lorelee Market, MD   Brief Narrative/Hospital Course: 41 year old female with history of morbid obesity BMI 55, known herniated disc followed by PCP at [facility] presented with acute on chronic low back pain following a fall earlier in the day, pain is severe in intensity and radiating down to the back of the left leg causing difficulty with ambulation.She has numbness and weakness of the left leg with a pins-and-needles sensation in the leg.She denies bladder or bowel retention on continence or numbness  in the groin area.  Active straight leg raise about 30 degrees In ED: MRI"1. Large left subarticular disc extrusion with superior migration at L4-L5 may affect the left L4 or L5 nerve roots. MRI at [facility] on 8/7 that showed a similar finding as follows: L4-L5: Bulging disc and large left subarticular/partially foraminal disc extrusion effacing the left lateral recess and compressing the left-sided traversing nerve roots, contributing to severe left neural foraminal narrowing. Findings are compounded by mild facet arthrosis and endplate osteophytes. Mild right neural foraminal narrowing  Patient WAS treated with Solu-Medrol and Percocet, fentanyl, Ativan.  Hospitalist consulted for admission for pain control.  Neurosurgeon was consulted by Dr. Karolee Stamps not a surgical candidate.  IR was discussed and underwent ESI and nerve bloc 8/22.  Patient is continuing pain management with IV/oral opiates, Lyrica, PT OT evaluation    Subjective: Alert awake resting comfortably does have pain on the left lower extremities decree sensation numbness tingling somewhat better from yesterday   Assessment and Plan: Principal Problem:   Intractable pain Active Problems:   Morbid obesity with BMI of 50.0-59.9, adult (HCC)   Herniated intervertebral disc of lumbar spine   Ambulatory dysfunction   Fall at home, initial encounter    Left lumbar radiculopathy   Intractable low back pain acute on chronic Herniated lumbar disc with left lumbar radiculopathy Accidental fall with frequent falls Ambulatory dysfunction:  Neurosurgeon was consulted by Dr. Karolee Stamps not a surgical candidate.  IR discussed and underwent ESI and nerve block 8/22.  Started on Aleve, gabapentin changed to Lyrica, placed on scheduled acetaminophen, on oral oxy which she takes at home, on IV morphine as needed continue same, continue PT OTSystemic steroids discontinued as they have not helped much.    Morbid obesity BMI 55 will benefit with outpatient weight loss PC follow-up sleep apnea evaluation,complicating factor to overall prognosis and care. Leukocytosis suspect from patient's steroid.  Afebrile.  DVT prophylaxis: SCDs Start: 05/15/22 0109 Code Status:   Code Status: Full Code Family Communication: plan of care discussed with patient at bedside. Patient status is: Admitted as observation remains hospitalized due to ongoing pain management needing IV morphine , changed to inpatient Level of care: Med-Surg   Dispo: The patient is from: Home            Anticipated disposition: Home in 1 to 2 days once pain is controlled  Mobility Assessment (last 72 hours)     Mobility Assessment     Row Name 05/16/22 1141 05/15/22 1929         Does patient have an order for bedrest or is patient medically unstable -- No - Continue assessment      What is the highest level of mobility based on the progressive mobility assessment? Level 5 (Walks with assist in room/hall) - Balance while stepping forward/back and can walk in room with assist - Complete Level 5 (Walks with assist in room/hall) - Balance while  stepping forward/back and can walk in room with assist - Complete                Objective: Vitals last 24 hrs: Vitals:   05/16/22 0319 05/16/22 0320 05/16/22 0820 05/16/22 1043  BP: 124/76 124/76 110/75 112/66  Pulse: 94 85 91   Resp: 20   18   Temp: 98.8 F (37.1 C)  97.6 F (36.4 C)   TempSrc: Oral  Oral   SpO2:  99% 100%   Weight:      Height:       Weight change:   Physical Examination: General exam: alert awake,older than stated age, weak appearing. HEENT:Oral mucosa moist, Ear/Nose WNL grossly, dentition normal. Respiratory system: bilaterally diminished BS, no use of accessory muscle Cardiovascular system: S1 & S2 +, No JVD. Gastrointestinal system: Abdomen soft,NT,ND, BS+ Nervous System:Alert, awake, moving extremities and grossly nonfocal Extremities: LE edema neg, left lower extremity w/ painful movement distal peripheral pulses palpable.  Skin: No rashes,no icterus. MSK: Normal muscle bulk,tone, power  Medications reviewed:  Scheduled Meds:  acetaminophen  1,000 mg Oral TID   naproxen  500 mg Oral BID WC   pregabalin  75 mg Oral BID   Continuous Infusions:    Diet Order             Diet regular Room service appropriate? Yes; Fluid consistency: Thin  Diet effective now                            Intake/Output Summary (Last 24 hours) at 05/16/2022 1446 Last data filed at 05/16/2022 0800 Gross per 24 hour  Intake 240 ml  Output 1800 ml  Net -1560 ml   Net IO Since Admission: -1,560 mL [05/16/22 1446]  Wt Readings from Last 3 Encounters:  05/14/22 (!) 156.9 kg  04/11/22 (!) 163.3 kg  01/08/22 (!) 163.3 kg     [employer] (From admission, onward)    None     Data Reviewed: I have personally reviewed following labs and imaging studies CBC: Recent Labs  Lab 05/16/22 0534  WBC 19.2*  HGB 12.3  HCT 36.2  MCV 88.3  PLT 371   Basic Metabolic Panel: Recent Labs  Lab 05/16/22 0534  NA 138  K 4.0  CL 110  CO2 22  GLUCOSE 226*  BUN 13  CREATININE 0.71  CALCIUM 8.9   GFR: Estimated Creatinine Clearance: 143.6 mL/min (by C-G formula based on SCr of 0.71 mg/dL). Liver Function Tests: No results for input(s): "AST", "ALT", "ALKPHOS", "BILITOT", "PROT", "ALBUMIN"  in the last 168 hours. No results for input(s): "LIPASE", "AMYLASE" in the last 168 hours. No results for input(s): "AMMONIA" in the last 168 hours. Coagulation Profile: No results for input(s): "INR", "PROTIME" in the last 168 hours. BNP (last 3 results) No results for input(s): "PROBNP" in the last 8760 hours. HbA1C: No results for input(s): "HGBA1C" in the last 72 hours. CBG: No results for input(s): "GLUCAP" in the last 168 hours. Lipid Profile: No results for input(s): "CHOL", "HDL", "LDLCALC", "TRIG", "CHOLHDL", "LDLDIRECT" in the last 72 hours. Thyroid Function Tests: No results for input(s): "TSH", "T4TOTAL", "FREET4", "T3FREE", "THYROIDAB" in the last 72 hours. Sepsis Labs: No results for input(s): "PROCALCITON", "LATICACIDVEN" in the last 168 hours.  No results found for this or any previous visit (from the past 240 hour(s)).  Antimicrobials: Anti-infectives (From admission, onward)    None      Culture/Microbiology  Component Value Date/Time   SDES  07/22/2018 2221    URINE, CLEAN CATCH Performed at [facility], [address] [place], [place] [address]    [facility]  07/22/2018 2221    Normal Performed at [facility], [place], [place] [address]    CULT MULTIPLE SPECIES PRESENT, SUGGEST RECOLLECTION (A) 07/22/2018 2221   REPTSTATUS 07/24/2018 FINAL 07/22/2018 2221    Other culture-see note  Radiology Studies: IR INJECT DIAG/THERA/INC NEEDLE/CATH/PLC EPI/LUMB/SAC W/IMG  Result Date: 05/15/2022 CLINICAL DATA:  41 year old female with severe left lower extremity L4 and L5 radiculopathy secondary to a large left subarticular disc extrusion with superior migration at L4-L5. She presents from the emergency department for left L4 and left L5 nerve root block with transforaminal steroid injection. EXAM: IR INJECT/THERA/INC NEEDLE/CATH/PLC EPI/LUMB/SAC W/IMG FLUOROSCOPY TIME:  Radiation exposure index: 77.1 mGy reference air  kerma PROCEDURE: The procedure, risks, benefits, and alternatives were explained to the patient. Questions regarding the procedure were encouraged and answered. The patient understands and consents to the procedure. LEFT L4 NERVE ROOT BLOCK AND TRANSFORAMINAL EPIDURAL: A posterior oblique approach was taken to the intervertebral foramen on the left at L4 using a curved 22 gauge spinal needle. Injection of Omnipaque 180 outlined the L4 nerve root and showed good epidural spread. No vascular opacification is seen. 80 mg of Depo-Medrol mixed with 2 mL 1% lidocaine were instilled. The procedure was well-tolerated, and the patient was discharged thirty minutes following the injection in good condition. LEFT L5 NERVE ROOT BLOCK AND TRANSFORAMINAL EPIDURAL: A posterior oblique approach was taken to the intervertebral foramen on the left at L5 using a curved 22 gauge spinal needle. Injection of Omnipaque 180 outlined the L5 nerve root and showed good epidural spread. No vascular opacification is seen. 80 mg of Depo-Medrol mixed with 2 mL 1% lidocaine were instilled. The procedure was well-tolerated, and the patient was discharged thirty minutes following the injection in good condition. COMPLICATIONS: None IMPRESSION: Technically successful injection consisting of left L4 and L5 nerve root block and transforaminal epidural injections. Electronically Signed   By: Jacqulynn Cadet M.D.   On: 05/15/2022 16:30   IR INJECT DIAG/THERA/INC NEEDLE/CATH/PLC EPI/LUMB/SAC W/IMG  Result Date: 05/15/2022 CLINICAL DATA:  41 year old female with severe left lower extremity L4 and L5 radiculopathy secondary to a large left subarticular disc extrusion with superior migration at L4-L5. She presents from the emergency department for left L4 and left L5 nerve root block with transforaminal steroid injection. EXAM: IR INJECT/THERA/INC NEEDLE/CATH/PLC EPI/LUMB/SAC W/IMG FLUOROSCOPY TIME:  Radiation exposure index: 77.1 mGy reference air  kerma PROCEDURE: The procedure, risks, benefits, and alternatives were explained to the patient. Questions regarding the procedure were encouraged and answered. The patient understands and consents to the procedure. LEFT L4 NERVE ROOT BLOCK AND TRANSFORAMINAL EPIDURAL: A posterior oblique approach was taken to the intervertebral foramen on the left at L4 using a curved 22 gauge spinal needle. Injection of Omnipaque 180 outlined the L4 nerve root and showed good epidural spread. No vascular opacification is seen. 80 mg of Depo-Medrol mixed with 2 mL 1% lidocaine were instilled. The procedure was well-tolerated, and the patient was discharged thirty minutes following the injection in good condition. LEFT L5 NERVE ROOT BLOCK AND TRANSFORAMINAL EPIDURAL: A posterior oblique approach was taken to the intervertebral foramen on the left at L5 using a curved 22 gauge spinal needle. Injection of Omnipaque 180 outlined the L5 nerve root and showed good epidural spread. No vascular opacification is seen.  80 mg of Depo-Medrol mixed with 2 mL 1% lidocaine were instilled. The procedure was well-tolerated, and the patient was discharged thirty minutes following the injection in good condition. COMPLICATIONS: None IMPRESSION: Technically successful injection consisting of left L4 and L5 nerve root block and transforaminal epidural injections. Electronically Signed   By: Jacqulynn Cadet M.D.   On: 05/15/2022 16:30   MR LUMBAR SPINE WO CONTRAST  Result Date: 05/15/2022 CLINICAL DATA:  Low back pain.  Fall. EXAM: MRI LUMBAR SPINE WITHOUT CONTRAST TECHNIQUE: Multiplanar, multisequence MR imaging of the lumbar spine was performed. No intravenous contrast was administered. COMPARISON:  None Available. FINDINGS: Segmentation:  Standard. Alignment:  Physiologic. Vertebrae:  No fracture, evidence of discitis, or bone lesion. Conus medullaris and cauda equina: Conus extends to the L2 level. Conus and cauda equina appear normal.  Paraspinal and other soft tissues: Negative. Disc levels: L1-L2: Normal disc space and facet joints. No spinal canal stenosis. No neural foraminal stenosis. L2-L3: Normal disc space and facet joints. No spinal canal stenosis. No neural foraminal stenosis. L3-L4: Normal disc space and facet joints. No spinal canal stenosis. No neural foraminal stenosis. L4-L5: Large left subarticular disc extrusion with superior migration. Left lateral recess narrowing without central spinal canal stenosis. Moderate left neural foraminal stenosis. L5-S1: Normal disc space and facet joints. No spinal canal stenosis. No neural foraminal stenosis. Visualized sacrum: Normal. IMPRESSION: 1. Large left subarticular disc extrusion with superior migration at L4-L5 may affect the left L4 or L5 nerve roots. Electronically Signed   By: Ulyses Jarred M.D.   On: 05/15/2022 00:04     LOS: 0 days   Antonieta Pert, MD [facility]  05/16/2022, 2:46 PM

## 2022-05-16 NOTE — TOC Initial Note (Signed)
Transition of Care [facility]) - Initial/Assessment Note    Patient Details  Name: Kristi Gutierrez MRN: [id number] Date of Birth: Jan 04, 1981  Transition of Care [facility]) CM/SW Contact:    Beverly Sessions, RN Phone Number: 05/16/2022, 2:23 PM  Clinical Narrative:                    Admitted for: boack pain Admitted from: home with husband and children PCP: [facility] family medicine Pharmacy: [facility] Current home health/prior home health/DME: has been borrowing a friends walker   Referral for [place], and [place] bariatric shower stool made to [place] with [employer]       Patient Goals and CMS Choice        Expected Discharge Plan and Services                                                Prior Living Arrangements/Services                       Activities of Daily Living      Permission Sought/Granted                  Emotional Assessment              Admission diagnosis:  Intractable pain [R52] Acute left-sided low back pain with sciatica, sciatica laterality unspecified [M54.40] Patient Active Problem List   Diagnosis Date Noted   Morbid obesity with BMI of 50.0-59.9, adult ([place]) 05/15/2022   Herniated intervertebral disc of lumbar spine 05/15/2022   Ambulatory dysfunction 05/15/2022   Intractable pain 05/15/2022   Fall at home, initial encounter 05/15/2022   Left lumbar radiculopathy 05/15/2022   Migraine    Intractable back pain 04/28/2018   Admission for sterilization 03/01/2015   PCP:  Lorelee Market, MD Pharmacy:   [facility] [place], [place] - [address] [place] [address] Phone: [phone] Fax: [place] #[id number] [address], [place] [address] [place] [address] Phone: [phone] Fax: [phone]     Social Determinants of Health (SDOH) Interventions     Readmission Risk Interventions     No data to display

## 2022-05-16 NOTE — Evaluation (Signed)
Physical Therapy Evaluation Patient Details Name: Kristi Gutierrez MRN: [id number] DOB: 1981-05-19 Today's Date: 05/16/2022  History of Present Illness  41 y.o. female with medical history significant for Morbid obesity, BMI 50-59, known herniated disc followed by PCP who presents to the ED with acute on chronic low back pain following a fall earlier in the day.  The pain is of severe intensity and is radiating down the back of the left leg causing difficulty with ambulation.  She has numbness and weakness of the left leg with a pins-and-needles sensation in the leg..  She denies bladder or bowel retention on continence or numbness  in the groin area.  Active straight leg raise about 30 degrees.  She denies other injuries related to the fall and has had prior falls related to weakness in her legs.  Clinical Impression  Pt in a lot of pain t/o the session, clearly hesitant to do a lot but willing to try none-the-less.  She was very UE reliant during ambulation with heavy WBing through walker, need to keep L knee fully extended (to avoid buckling) and reported increasing LE symptoms on top of the perpetual low back pain.  Pt displayed considerable weakness t/o L LE but still did manage to walk >100 ft with a walker, and go up down steps again with heavy UE reliance.  Pt reports she has a f/u with spinal MD at [facility] in ~2 weeks, in the mean time she would benefit from a walker and possibly a w/c as her mobility/activity tolerance is very limited due to pain.     Recommendations for follow up therapy are one component of a multi-disciplinary discharge planning process, led by the attending physician.  Recommendations may be updated based on patient status, additional functional criteria and insurance authorization.  Follow Up Recommendations Outpatient PT (recommend f/u with neuro/ortho/spinal MD first)      Assistance Recommended at Discharge Intermittent Supervision/Assistance  Patient can return home with  the following  A little help with walking and/or transfers;A little help with bathing/dressing/bathroom;Assistance with cooking/housework;Help with stairs or ramp for entrance;Assist for transportation    Equipment Recommendations Rolling walker (2 wheels);Wheelchair (measurements PT) (bariatric)  Recommendations for Other Services       Functional Status Assessment Patient has had a recent decline in their functional status and demonstrates the ability to make significant improvements in function in a reasonable and predictable amount of time.     Precautions / Restrictions Precautions Precautions: Fall Restrictions Weight Bearing Restrictions: No      Mobility  Bed Mobility Overal bed mobility: Modified Independent             General bed mobility comments: cuing for log roll technique (heavy use of rails/UEs but able to get to sitting w/o assist)    Transfers Overall transfer level: Needs assistance Equipment used: Rolling walker (2 wheels) Transfers: Sit to/from Stand, Bed to chair/wheelchair/BSC Sit to Stand: Min guard           General transfer comment: Pt leaning to R and with heavy use of UEs to attain standing multiple times from multiple surfaces t/o the session    Ambulation/Gait Ambulation/Gait assistance: Supervision Gait Distance (Feet): 125 Feet Assistive device: Rolling walker (2 wheels)         General Gait Details: Pt with L knee maintaining TKE (2/2 buckling risk as well as pain) and was very reliant on UEs/walker the entire time.  HR up to 120s witth the effort and pt reporting increased/severe  pain with prolonged standing/upright activity.  Stairs Stairs: Yes Stairs assistance: Min guard Stair Management: One rail Left Number of Stairs: 6 General stair comments: Pt needed excessive UE use to ascend/descend steps.  side stepping with b/l UEs on single rail to descend, 1 rail & 1 HHA to ascend - again guarded, hesistant and heavily reliant  on UEs  Wheelchair Mobility    Modified Rankin (Stroke Patients Only)       Balance Overall balance assessment: Needs assistance Sitting-balance support: Feet supported Sitting balance-Leahy Scale: Good     Standing balance support: Reliant on assistive device for balance, During functional activity, Bilateral upper extremity supported Standing balance-Leahy Scale: Fair                               Pertinent Vitals/Pain Pain Assessment Pain Assessment: 0-10 Pain Score: 10-Worst pain ever Pain Location: back (L>R) and down the L LE Pain Descriptors / Indicators: Discomfort, Guarding, Crying    Home Living Family/patient expects to be discharged to:: Private residence Living Arrangements: Spouse/significant other;Children Available Help at Discharge: Family;Available PRN/intermittently Type of Home: House Home Access: Stairs to enter Entrance Stairs-Rails: Right;Left;Can reach both Entrance Stairs-Number of Steps: 3   Home Layout: One level Home Equipment: Grab bars - toilet;Grab bars - tub/shower      Prior Function Prior Level of Function : Independent/Modified Independent;Driving;Working/employed             Mobility Comments: Pt ambulates with a RW she has borrowed from friend and has had multiple falls in the last 6 months. ADLs Comments: Pt reports driving and going to work each day. She performs self care tasks I with some help for shoes if needed. Family performs IADL tasks.     Hand Dominance        Extremity/Trunk Assessment   Upper Extremity Assessment Upper Extremity Assessment: Overall WFL for tasks assessed    Lower Extremity Assessment Lower Extremity Assessment: LLE deficits/detail LLE Deficits / Details: knee ext 2+/5, very limited ankle DF AROM, numbness in medial aspect of foot/calf/knee, pain limited hip flexion       Communication   Communication: No difficulties  Cognition Arousal/Alertness: Awake/alert Behavior  During Therapy: WFL for tasks assessed/performed Overall Cognitive Status: Within Functional Limits for tasks assessed                                          General Comments General comments (skin integrity, edema, etc.): Pt c/o severe pain t/o the session, tearful at times    Exercises     Assessment/Plan    PT Assessment Patient needs continued PT services  PT Problem List Decreased strength;Decreased range of motion;Decreased activity tolerance;Decreased balance;Decreased mobility;Decreased knowledge of use of DME;Decreased safety awareness;Cardiopulmonary status limiting activity;Pain       PT Treatment Interventions DME instruction;Gait training;Stair training;Functional mobility training;Therapeutic activities;Therapeutic exercise;Balance training;Cognitive remediation;Patient/family education    PT Goals (Current goals can be found in the Care Plan section)  Acute Rehab PT Goals Patient Stated Goal: Control back pain PT Goal Formulation: With patient Time For Goal Achievement: 05/29/22 Potential to Achieve Goals: Fair    Frequency Min 2X/week     Co-evaluation               AM-PAC PT "6 Clicks" Mobility  Outcome Measure Help needed turning from  your back to your side while in a flat bed without using bedrails?: A Little Help needed moving from lying on your back to sitting on the side of a flat bed without using bedrails?: A Little Help needed moving to and from a bed to a chair (including a wheelchair)?: A Little Help needed standing up from a chair using your arms (e.g., wheelchair or bedside chair)?: A Little Help needed to walk in hospital room?: A Little Help needed climbing 3-5 steps with a railing? : A Little 6 Click Score: 18    End of Session Equipment Utilized During Treatment: Gait belt Activity Tolerance: Patient limited by pain Patient left: with bed alarm set;with call bell/phone within reach Nurse Communication: Mobility  status;Need for lift equipment PT Visit Diagnosis: Muscle weakness (generalized) (M62.81);Difficulty in walking, not elsewhere classified (R26.2);Other symptoms and signs involving the nervous system (R29.898);Pain Pain - Right/Left: Left Pain - part of body: Hip    Time: 1400-1443 PT Time Calculation (min) (ACUTE ONLY): 43 min   Charges:   PT Evaluation $PT Eval Low Complexity: 1 Low PT Treatments $Gait Training: 8-22 mins        Kreg Shropshire, DPT 05/16/2022, 3:35 PM

## 2022-05-16 NOTE — Progress Notes (Signed)
1 PROGRESS NOTE Kristi Gutierrez  WUJ:[id number] DOB: [birth year] DOA: 05/14/2022 PCP: Lorelee Market, MD   Brief Narrative/Hospital Course: 41 year old female with history of morbid obesity BMI 55, known herniated disc followed by PCP at [facility] presented with acute on chronic low back pain following a fall earlier in the day, pain is severe in intensity and radiating down to the back of the left leg causing difficulty with ambulation.She has numbness and weakness of the left leg with a pins-and-needles sensation in the leg.She denies bladder or bowel retention on continence or numbness  in the groin area.  Active straight leg raise about 30 degrees In ED: MRI"1. Large left subarticular disc extrusion with superior migration at L4-L5 may affect the left L4 or L5 nerve roots. MRI at [facility] on 8/7 that showed a similar finding as follows: L4-L5: Bulging disc and large left subarticular/partially foraminal disc extrusion effacing the left lateral recess and compressing the left-sided traversing nerve roots, contributing to severe left neural foraminal narrowing. Findings are compounded by mild facet arthrosis and endplate osteophytes. Mild right neural foraminal narrowing  Patient WAS treated with Solu-Medrol and Percocet, fentanyl, Ativan.  Hospitalist consulted for admission for pain control.  Neurosurgeon was consulted by Dr. Karolee Stamps not a surgical candidate.  IR was discussed and underwent ESI and nerve bloc 8/22.  Patient is continuing pain management with IV/oral opiates, Lyrica, PT OT evaluation    Subjective: Alert awake resting comfortably does have pain on the left lower extremities decree sensation numbness tingling somewhat better from yesterday   Assessment and Plan: Principal Problem:   Intractable pain Active Problems:   Morbid obesity with BMI of 50.0-59.9, adult (HCC)   Herniated intervertebral disc of lumbar spine   Ambulatory dysfunction   Fall at home, initial encounter    Left lumbar radiculopathy   Intractable low back pain acute on chronic Herniated lumbar disc with left lumbar radiculopathy Accidental fall with frequent falls Ambulatory dysfunction:  Neurosurgeon was consulted by Dr. Karolee Stamps not a surgical candidate.  IR discussed and underwent ESI and nerve block 8/22.  Started on Aleve, gabapentin changed to Lyrica, placed on scheduled acetaminophen, on oral oxy which she takes at home, on IV morphine as needed continue same, continue PT OTSystemic steroids discontinued as they have not helped much.    Morbid obesity BMI 55 will benefit with outpatient weight loss PC follow-up sleep apnea evaluation,complicating factor to overall prognosis and care. Leukocytosis suspect from patient's steroid.  Afebrile.  DVT prophylaxis: SCDs Start: 05/15/22 0109 Code Status:   Code Status: Full Code Family Communication: plan of care discussed with patient at bedside. Patient status is: Admitted as observation remains hospitalized due to ongoing pain management needing IV morphine , changed to inpatient Level of care: Med-Surg   Dispo: The patient is from: Home            Anticipated disposition: Home in 1 to 2 days once pain is controlled  Mobility Assessment (last 72 hours)     Mobility Assessment     Row Name 05/16/22 1141 05/15/22 1929         Does patient have an order for bedrest or is patient medically unstable -- No - Continue assessment      What is the highest level of mobility based on the progressive mobility assessment? Level 5 (Walks with assist in room/hall) - Balance while stepping forward/back and can walk in room with assist - Complete Level 5 (Walks with assist in room/hall) - Balance  while stepping forward/back and can walk in room with assist - Complete                Objective: Vitals last 24 hrs: Vitals:   05/16/22 0319 05/16/22 0320 05/16/22 0820 05/16/22 1043  BP: 124/76 124/76 110/75 112/66  Pulse: 94 85 91   Resp:  20  18   Temp: 98.8 F (37.1 C)  97.6 F (36.4 C)   TempSrc: Oral  Oral   SpO2:  99% 100%   Weight:      Height:       Weight change:   Physical Examination: General exam: alert awake,older than stated age, weak appearing. HEENT:Oral mucosa moist, Ear/Nose WNL grossly, dentition normal. Respiratory system: bilaterally diminished BS, no use of accessory muscle Cardiovascular system: S1 & S2 +, No JVD. Gastrointestinal system: Abdomen soft,NT,ND, BS+ Nervous System:Alert, awake, moving extremities and grossly nonfocal Extremities: LE edema neg, left lower extremity w/ painful movement distal peripheral pulses palpable.  Skin: No rashes,no icterus. MSK: Normal muscle bulk,tone, power  Medications reviewed:  Scheduled Meds:  acetaminophen  1,000 mg Oral TID   naproxen  500 mg Oral BID WC   pregabalin  75 mg Oral BID   Continuous Infusions:    Diet Order             Diet regular Room service appropriate? Yes; Fluid consistency: Thin  Diet effective now                            Intake/Output Summary (Last 24 hours) at 05/16/2022 1447 Last data filed at 05/16/2022 0800 Gross per 24 hour  Intake 240 ml  Output 1800 ml  Net -1560 ml    Net IO Since Admission: -1,560 mL [05/16/22 1447]  Wt Readings from Last 3 Encounters:  05/14/22 (!) 156.9 kg  04/11/22 (!) 163.3 kg  01/08/22 (!) 163.3 kg     [employer] (From admission, onward)    None     Data Reviewed: I have personally reviewed following labs and imaging studies CBC: Recent Labs  Lab 05/16/22 0534  WBC 19.2*  HGB 12.3  HCT 36.2  MCV 88.3  PLT 947    Basic Metabolic Panel: Recent Labs  Lab 05/16/22 0534  NA 138  K 4.0  CL 110  CO2 22  GLUCOSE 226*  BUN 13  CREATININE 0.71  CALCIUM 8.9    GFR: Estimated Creatinine Clearance: 143.6 mL/min (by C-G formula based on SCr of 0.71 mg/dL). Liver Function Tests: No results for input(s): "AST", "ALT", "ALKPHOS", "BILITOT", "PROT",  "ALBUMIN" in the last 168 hours. No results for input(s): "LIPASE", "AMYLASE" in the last 168 hours. No results for input(s): "AMMONIA" in the last 168 hours. Coagulation Profile: No results for input(s): "INR", "PROTIME" in the last 168 hours. BNP (last 3 results) No results for input(s): "PROBNP" in the last 8760 hours. HbA1C: No results for input(s): "HGBA1C" in the last 72 hours. CBG: No results for input(s): "GLUCAP" in the last 168 hours. Lipid Profile: No results for input(s): "CHOL", "HDL", "LDLCALC", "TRIG", "CHOLHDL", "LDLDIRECT" in the last 72 hours. Thyroid Function Tests: No results for input(s): "TSH", "T4TOTAL", "FREET4", "T3FREE", "THYROIDAB" in the last 72 hours. Sepsis Labs: No results for input(s): "PROCALCITON", "LATICACIDVEN" in the last 168 hours.  No results found for this or any previous visit (from the past 240 hour(s)).  Antimicrobials: Anti-infectives (From admission, onward)    None  Culture/Microbiology    Component Value Date/Time   SDES  07/22/2018 2221    URINE, CLEAN CATCH Performed at [facility], [address] [place], [place] [address]    [facility]  07/22/2018 2221    Normal Performed at [facility], [place], [place] [address]    CULT MULTIPLE SPECIES PRESENT, SUGGEST RECOLLECTION (A) 07/22/2018 2221   REPTSTATUS 07/24/2018 FINAL 07/22/2018 2221    Other culture-see note  Radiology Studies: IR INJECT DIAG/THERA/INC NEEDLE/CATH/PLC EPI/LUMB/SAC W/IMG  Result Date: 05/15/2022 CLINICAL DATA:  41 year old female with severe left lower extremity L4 and L5 radiculopathy secondary to a large left subarticular disc extrusion with superior migration at L4-L5. She presents from the emergency department for left L4 and left L5 nerve root block with transforaminal steroid injection. EXAM: IR INJECT/THERA/INC NEEDLE/CATH/PLC EPI/LUMB/SAC W/IMG FLUOROSCOPY TIME:  Radiation exposure index: 77.1 mGy  reference air kerma PROCEDURE: The procedure, risks, benefits, and alternatives were explained to the patient. Questions regarding the procedure were encouraged and answered. The patient understands and consents to the procedure. LEFT L4 NERVE ROOT BLOCK AND TRANSFORAMINAL EPIDURAL: A posterior oblique approach was taken to the intervertebral foramen on the left at L4 using a curved 22 gauge spinal needle. Injection of Omnipaque 180 outlined the L4 nerve root and showed good epidural spread. No vascular opacification is seen. 80 mg of Depo-Medrol mixed with 2 mL 1% lidocaine were instilled. The procedure was well-tolerated, and the patient was discharged thirty minutes following the injection in good condition. LEFT L5 NERVE ROOT BLOCK AND TRANSFORAMINAL EPIDURAL: A posterior oblique approach was taken to the intervertebral foramen on the left at L5 using a curved 22 gauge spinal needle. Injection of Omnipaque 180 outlined the L5 nerve root and showed good epidural spread. No vascular opacification is seen. 80 mg of Depo-Medrol mixed with 2 mL 1% lidocaine were instilled. The procedure was well-tolerated, and the patient was discharged thirty minutes following the injection in good condition. COMPLICATIONS: None IMPRESSION: Technically successful injection consisting of left L4 and L5 nerve root block and transforaminal epidural injections. Electronically Signed   By: Jacqulynn Cadet M.D.   On: 05/15/2022 16:30   IR INJECT DIAG/THERA/INC NEEDLE/CATH/PLC EPI/LUMB/SAC W/IMG  Result Date: 05/15/2022 CLINICAL DATA:  41 year old female with severe left lower extremity L4 and L5 radiculopathy secondary to a large left subarticular disc extrusion with superior migration at L4-L5. She presents from the emergency department for left L4 and left L5 nerve root block with transforaminal steroid injection. EXAM: IR INJECT/THERA/INC NEEDLE/CATH/PLC EPI/LUMB/SAC W/IMG FLUOROSCOPY TIME:  Radiation exposure index: 77.1 mGy  reference air kerma PROCEDURE: The procedure, risks, benefits, and alternatives were explained to the patient. Questions regarding the procedure were encouraged and answered. The patient understands and consents to the procedure. LEFT L4 NERVE ROOT BLOCK AND TRANSFORAMINAL EPIDURAL: A posterior oblique approach was taken to the intervertebral foramen on the left at L4 using a curved 22 gauge spinal needle. Injection of Omnipaque 180 outlined the L4 nerve root and showed good epidural spread. No vascular opacification is seen. 80 mg of Depo-Medrol mixed with 2 mL 1% lidocaine were instilled. The procedure was well-tolerated, and the patient was discharged thirty minutes following the injection in good condition. LEFT L5 NERVE ROOT BLOCK AND TRANSFORAMINAL EPIDURAL: A posterior oblique approach was taken to the intervertebral foramen on the left at L5 using a curved 22 gauge spinal needle. Injection of Omnipaque 180 outlined the L5 nerve root and showed good epidural spread. No  vascular opacification is seen. 80 mg of Depo-Medrol mixed with 2 mL 1% lidocaine were instilled. The procedure was well-tolerated, and the patient was discharged thirty minutes following the injection in good condition. COMPLICATIONS: None IMPRESSION: Technically successful injection consisting of left L4 and L5 nerve root block and transforaminal epidural injections. Electronically Signed   By: Jacqulynn Cadet M.D.   On: 05/15/2022 16:30   MR LUMBAR SPINE WO CONTRAST  Result Date: 05/15/2022 CLINICAL DATA:  Low back pain.  Fall. EXAM: MRI LUMBAR SPINE WITHOUT CONTRAST TECHNIQUE: Multiplanar, multisequence MR imaging of the lumbar spine was performed. No intravenous contrast was administered. COMPARISON:  None Available. FINDINGS: Segmentation:  Standard. Alignment:  Physiologic. Vertebrae:  No fracture, evidence of discitis, or bone lesion. Conus medullaris and cauda equina: Conus extends to the L2 level. Conus and cauda equina appear  normal. Paraspinal and other soft tissues: Negative. Disc levels: L1-L2: Normal disc space and facet joints. No spinal canal stenosis. No neural foraminal stenosis. L2-L3: Normal disc space and facet joints. No spinal canal stenosis. No neural foraminal stenosis. L3-L4: Normal disc space and facet joints. No spinal canal stenosis. No neural foraminal stenosis. L4-L5: Large left subarticular disc extrusion with superior migration. Left lateral recess narrowing without central spinal canal stenosis. Moderate left neural foraminal stenosis. L5-S1: Normal disc space and facet joints. No spinal canal stenosis. No neural foraminal stenosis. Visualized sacrum: Normal. IMPRESSION: 1. Large left subarticular disc extrusion with superior migration at L4-L5 may affect the left L4 or L5 nerve roots. Electronically Signed   By: Ulyses Jarred M.D.   On: 05/15/2022 00:04     LOS: 0 days   Antonieta Pert, MD [facility]  05/16/2022, 2:47 PM

## 2022-05-17 LAB — CBC
HCT: 37.2 % (ref 36.0–46.0)
Hemoglobin: 12.2 g/dL (ref 12.0–15.0)
MCH: 29.9 pg (ref 26.0–34.0)
MCHC: 32.8 g/dL (ref 30.0–36.0)
MCV: 91.2 fL (ref 80.0–100.0)
Platelets: 216 10*3/uL (ref 150–400)
RBC: 4.08 MIL/uL (ref 3.87–5.11)
RDW: 12.6 % (ref 11.5–15.5)
WBC: 14 10*3/uL — ABNORMAL HIGH (ref 4.0–10.5)
nRBC: 0 % (ref 0.0–0.2)

## 2022-05-17 MED ORDER — SENNOSIDES-DOCUSATE SODIUM 8.6-50 MG PO TABS
1.0000 | ORAL_TABLET | Freq: Two times a day (BID) | ORAL | Status: DC
  Administered 2022-05-17 – 2022-05-19 (×3): 1 via ORAL
  Filled 2022-05-17 (×5): qty 1

## 2022-05-17 MED ORDER — CYCLOBENZAPRINE HCL 10 MG PO TABS
10.0000 mg | ORAL_TABLET | Freq: Three times a day (TID) | ORAL | Status: DC
  Administered 2022-05-17 – 2022-05-18 (×3): 10 mg via ORAL
  Filled 2022-05-17 (×3): qty 1

## 2022-05-17 MED ORDER — PREGABALIN 50 MG PO CAPS
100.0000 mg | ORAL_CAPSULE | Freq: Two times a day (BID) | ORAL | Status: DC
  Administered 2022-05-17 – 2022-05-18 (×2): 100 mg via ORAL
  Filled 2022-05-17 (×2): qty 2

## 2022-05-17 MED ORDER — OXYCODONE HCL 5 MG PO TABS: 5.0000 mg | ORAL_TABLET | ORAL | Status: DC | PRN

## 2022-05-17 MED ORDER — POLYETHYLENE GLYCOL 3350 17 G PO PACK
17.0000 g | PACK | Freq: Every day | ORAL | Status: DC
  Administered 2022-05-17 – 2022-05-19 (×3): 17 g via ORAL
  Filled 2022-05-17 (×3): qty 1

## 2022-05-17 NOTE — TOC Progression Note (Signed)
Transition of Care [facility]) - Progression Note    Patient Details  Name: Kristi Gutierrez MRN: [id number] Date of Birth: 1980-12-13  Transition of Care [facility]) CM/SW Contact  Beverly Sessions, RN Phone Number: 05/17/2022, 4:02 PM  Clinical Narrative:     Lily Peer emailed to determine if patient would be a candidate for Medicaid.  Therapy recommending outpatient therapy after patient sees neuro/ortho/spinal   Husband to transport at discharge Shower seat, RW and WC delivered to home        Expected Discharge Plan and Services                                                 Social Determinants of Health (SDOH) Interventions    Readmission Risk Interventions     No data to display

## 2022-05-17 NOTE — Progress Notes (Signed)
Progress Note   Patient: Kristi Gutierrez OMV:[id number] DOB: 03/29/1981 DOA: 05/14/2022     1 DOS: the patient was seen and examined on 05/17/2022   Brief hospital course: 41 year old female with history of morbid obesity BMI 55, known herniated disc followed by PCP at [facility] presented with acute on chronic low back pain following a fall earlier in the day.  She reported pain very severe in intensity and radiating down to the back of the left leg causing difficulty with ambulation.  She has numbness and weakness of the left leg with a pins-and-needles sensation in the leg, loss of sensation in the foot.  No bladder or bowel retention on continence or numbness in the groin area.  Active straight leg raise about 30 degrees.  In ED: MRI -- "1. Large left subarticular disc extrusion with superior migration at L4-L5 may affect the left L4 or L5 nerve roots. MRI at [facility] on 8/7 that showed a similar finding as follows: L4-L5: Bulging disc and large left subarticular/partially foraminal disc extrusion effacing the left lateral recess and compressing the left-sided traversing nerve roots, contributing to severe left neural foraminal narrowing. Findings are compounded by mild facet arthrosis and endplate osteophytes. Mild right neural foraminal narrowing."  Patient was treated with Solu-Medrol and Percocet, fentanyl, Ativan.  Hospitalist consulted for admission for pain control.  Neurosurgeon was consulted by Dr. Karolee Stamps not a surgical candidate.  IR was consulted and performed ESI and nerve block on 8/22.    PT evaluated, recommended Outpatient PT after discharge. DME has been ordered including wheelchair, rolling walker and shower stool.   Pain management continues to be challenging.  Patient requiring IV and oral therapies with no relief as of yet.         Assessment and Plan: * Intractable pain Herniated lumbar disc with left lumbar radiculopathy Accidental fall with frequent  falls Ambulatory dysfunction Pain control with NSAIDs, and narcotics for breakthrough.   Increase Lyrica to 100 mg twice daily  Increase Flexeril to 10 mg 3 times daily Treated with systemic steroids but stopped given no improvement PT recommends outpatient PT Fall precautions IR performed epidural steroid injection and nerve block on 8/22 No cord compression seen on MRI but can consider neurosurgery consult inpatient or outpatient referral  Morbid obesity with BMI of 50.0-59.9, adult ([place]) Body mass index is 55.85 kg/m. Complicates overall care and prognosis.  Recommend lifestyle modifications including physical activity and diet for weight loss and overall long-term health.   Left lumbar radiculopathy See intractable back pain. Gabapentin changed to Lyrica, increased dose  Fall at home, initial encounter With subsequent acute on chronic back pain as outlined  Ambulatory dysfunction Due to lumbar disc herniation and radiculopathy. PT recommended outpatient PT. DME has been ordered including wheelchair, rolling walker and shower stool.  Herniated intervertebral disc of lumbar spine See intractable pain. Status post ESI and nerve block by interventional radiology on 8/22.        Subjective:  and will monitor for improvement patient awake resting in bed when seen on rounds today.  She reports still having severe uncontrolled pain.  She can barely lift her left leg off the bed against gravity.  She reports almost total loss of sensation in the foot.  She works as both a Animal nutritionist for [employer] and expresses need to return to work as soon as possible as she is the sole provider for her and her husband who has MS and is disabled.  Physical Exam: Vitals:   05/17/22 0414 05/17/22 0459 05/17/22 0523 05/17/22 0553  BP:      Pulse:      Resp: '18 16 17 16  '$ Temp:      TempSrc:      SpO2:      Weight:      Height:       General exam: awake, alert,  no acute distress HEENTmoist mucus membranes, hearing grossly normal  Respiratory system: On room air, normal respiratory effort. Cardiovascular system: RRR, no significant peripheral edema.   Central nervous system: A&O x4.  Left hip flexion against gravity is weak, diminished sensation of the left foot, otherwise no gross focal neurologic deficits, normal speech Extremities: no edema, normal tone Skin: dry, intact, normal temperature Psychiatry: Anxious mood, congruent affect, judgement and insight appear normal    Data Reviewed:  Notable labs --- CBC with white count 14.0 improved from 19.2  Family Communication: None at bedside on rounds, will attempt to call  Disposition: Status is: Inpatient Remains inpatient appropriate because: Remains with uncontrolled pain and requiring IV therapies    Planned Discharge Destination: Home    Time spent: 40 minutes  Author: Ezekiel Slocumb, DO 05/17/2022 3:39 PM  For on call review [email or web address].

## 2022-05-17 NOTE — Assessment & Plan Note (Signed)
Due to lumbar disc herniation and radiculopathy. PT recommended outpatient PT. DME has been ordered including wheelchair, rolling walker and shower stool.

## 2022-05-17 NOTE — Assessment & Plan Note (Signed)
See intractable pain. Status post ESI and nerve block by interventional radiology on 8/22.

## 2022-05-17 NOTE — Assessment & Plan Note (Signed)
See intractable back pain. Gabapentin changed to Lyrica, increased dose

## 2022-05-17 NOTE — Assessment & Plan Note (Signed)
With subsequent acute on chronic back pain as outlined

## 2022-05-18 DIAGNOSIS — M5126 Other intervertebral disc displacement, lumbar region: Secondary | ICD-10-CM

## 2022-05-18 DIAGNOSIS — M5416 Radiculopathy, lumbar region: Secondary | ICD-10-CM

## 2022-05-18 MED ORDER — PREGABALIN 75 MG PO CAPS
200.0000 mg | ORAL_CAPSULE | Freq: Two times a day (BID) | ORAL | Status: DC
  Administered 2022-05-18 – 2022-05-19 (×2): 200 mg via ORAL
  Filled 2022-05-18 (×2): qty 1

## 2022-05-18 MED ORDER — OXYCODONE HCL 5 MG PO TABS
5.0000 mg | ORAL_TABLET | Freq: Four times a day (QID) | ORAL | Status: DC | PRN
  Administered 2022-05-19 (×2): 10 mg via ORAL
  Filled 2022-05-18 (×2): qty 2

## 2022-05-18 MED ORDER — DULOXETINE HCL 30 MG PO CPEP
30.0000 mg | ORAL_CAPSULE | Freq: Every day | ORAL | Status: DC
  Administered 2022-05-18 – 2022-05-19 (×2): 30 mg via ORAL
  Filled 2022-05-18 (×2): qty 1

## 2022-05-18 MED ORDER — LIDOCAINE 5 % EX PTCH
1.0000 | MEDICATED_PATCH | CUTANEOUS | Status: DC
  Administered 2022-05-18: 1 via TRANSDERMAL
  Filled 2022-05-18: qty 1

## 2022-05-18 MED ORDER — MORPHINE SULFATE (PF) 2 MG/ML IV SOLN: 2.0000 mg | INTRAVENOUS | Status: DC | PRN

## 2022-05-18 MED ORDER — OXYCODONE HCL 5 MG PO TABS
10.0000 mg | ORAL_TABLET | Freq: Four times a day (QID) | ORAL | Status: DC
  Administered 2022-05-18 – 2022-05-19 (×5): 10 mg via ORAL
  Filled 2022-05-18 (×5): qty 2

## 2022-05-18 MED ORDER — TIZANIDINE HCL 4 MG PO TABS
4.0000 mg | ORAL_TABLET | Freq: Three times a day (TID) | ORAL | Status: DC
  Administered 2022-05-18 – 2022-05-19 (×4): 4 mg via ORAL
  Filled 2022-05-18 (×5): qty 1

## 2022-05-18 NOTE — Progress Notes (Signed)
Physical Therapy Treatment Patient Details Name: Kristi Gutierrez MRN: [id number] DOB: [birth year] Today's Date: 05/18/2022   History of Present Illness 41 y.o. female with medical history significant for Morbid obesity, BMI 50-59, known herniated disc followed by PCP who presents to the ED with acute on chronic low back pain following a fall earlier in the day.  The pain is of severe intensity and is radiating down the back of the left leg causing difficulty with ambulation.  She has numbness and weakness of the left leg with a pins-and-needles sensation in the leg..  She denies bladder or bowel retention on continence or numbness  in the groin area.  Active straight leg raise about 30 degrees.  She denies other injuries related to the fall and has had prior falls related to weakness in her legs.   PT Comments    Patient has been out of bed recently and requested to remain in bed but agreeable to perform therapeutic strengthening exercises. Exercises performed on LLE with occasional AAROM and encouraged patient to perform in pain free range. Encouraged the patient continue using the rolling walker for safety with ambulation. She reports her left knee continues to buckle with weight bearing unless she keeps knee fully extended. She reports the injection and pain medications have not significantly helped. PT will continue to follow to maximize independence and facilitate return to prior level of function.    Recommendations for follow up therapy are one component of a multi-disciplinary discharge planning process, led by the attending physician.  Recommendations may be updated based on patient status, additional functional criteria and insurance authorization.  Follow Up Recommendations  Outpatient PT     Assistance Recommended at Discharge Intermittent Supervision/Assistance  Patient can return home with the following A little help with walking and/or transfers;A little help with  bathing/dressing/bathroom;Assistance with cooking/housework;Help with stairs or ramp for entrance;Assist for transportation   Equipment Recommendations  Rolling walker (2 wheels);Wheelchair (measurements PT)    Recommendations for Other Services       Precautions / Restrictions Precautions Precautions: Fall Restrictions Weight Bearing Restrictions: No     Mobility  Bed Mobility               General bed mobility comments: patient declined due to just getting up and constant pain in LLE despite recent pain meds    Transfers                        Ambulation/Gait                   Stairs             Wheelchair Mobility    Modified Rankin (Stroke Patients Only)       Balance                                            Cognition Arousal/Alertness: Awake/alert Behavior During Therapy: WFL for tasks assessed/performed Overall Cognitive Status: Within Functional Limits for tasks assessed                                          Exercises General Exercises - Lower Extremity Ankle Circles/Pumps: AROM, Strengthening, Left, 10 reps, Supine (pain at end dorsiflexion range so exercise  performed just shy of end range) Quad Sets: AROM, Strengthening, Left, Supine, 5 reps Gluteal Sets: AROM, Strengthening, Both, 5 reps, Supine Hip ABduction/ADduction: AAROM, Strengthening, Left, 10 reps, Supine Other Exercises Other Exercises: feedback provided for proper exercise technique.    General Comments General comments (skin integrity, edema, etc.): patient reports overall her pain is no better than when she came to the hospital. she has been getting OOB with the rolling walker and mobilizing some. she reports she just got up not long ago and requested to do bed level exercises for strengthening LLE. encouraged patient to continue using the rolling walker for safety. of note, the patient also continues to report left  knee buckling with any weight bearing if her knee is not fully extended      Pertinent Vitals/Pain Pain Assessment Pain Assessment: Faces Faces Pain Scale: Hurts even more Pain Location: lower back and left leg Pain Descriptors / Indicators: Pins and needles, Grimacing, Guarding Pain Intervention(s): Limited activity within patient's tolerance, Premedicated before session, Monitored during session, Repositioned    Home Living                          Prior Function            PT Goals (current goals can now be found in the care plan section) Acute Rehab PT Goals Patient Stated Goal: pain control PT Goal Formulation: With patient Time For Goal Achievement: 05/29/22 Potential to Achieve Goals: Fair Progress towards PT goals: Progressing toward goals    Frequency    Min 2X/week      PT Plan Current plan remains appropriate    Co-evaluation              AM-PAC PT "6 Clicks" Mobility   Outcome Measure  Help needed turning from your back to your side while in a flat bed without using bedrails?: A Little Help needed moving from lying on your back to sitting on the side of a flat bed without using bedrails?: A Little Help needed moving to and from a bed to a chair (including a wheelchair)?: A Little Help needed standing up from a chair using your arms (e.g., wheelchair or bedside chair)?: A Little Help needed to walk in hospital room?: A Little Help needed climbing 3-5 steps with a railing? : A Little 6 Click Score: 18    End of Session   Activity Tolerance: Patient limited by pain Patient left: in bed;with call bell/phone within reach   PT Visit Diagnosis: Muscle weakness (generalized) (M62.81);Difficulty in walking, not elsewhere classified (R26.2);Other symptoms and signs involving the nervous system (R29.898);Pain Pain - Right/Left: Left Pain - part of body: Leg     Time: 1443-1540 PT Time Calculation (min) (ACUTE ONLY): 17 min  Charges:   $Therapeutic Exercise: 8-22 mins                    Kristi Gutierrez, PT, MPT    Kristi Gutierrez 05/18/2022, 1:03 PM

## 2022-05-18 NOTE — Consult Note (Signed)
Referring Physician:  No referring provider defined for this encounter.  Primary Physician:  Kristi Market, MD  History of Present Illness: 05/18/2022 [name] is here today with a chief complaint of back and left leg pain.  She began having left hip area pain approximate 2 months ago.  She reports about a month and a half of severe radiating pain down her left leg as well as weakness in her left ankle.  She has been having trouble walking due to pain when she bears weight.  She has no issues with continence, but does have increasing pain when she sits on the toilet.  She reports some sensory changes down her left leg.  Standing and walking make it worse.  Nothing really helps completely.  She did have an epidural steroid injection 3 days ago.  She has not had good relief from that to this point.  Bowel/Bladder Dysfunction: none  Conservative measures:  Physical therapy: doing in hospital  Multimodal medical therapy including regular antiinflammatories: see MAR  Injections:  05/15/22 - L L4 and L5 SNRB/TFESI  Past Surgery: none  Kristi Gutierrez has no symptoms of cervical myelopathy.  The symptoms are causing a significant impact on the patient's life.   Review of Systems:  A 10 point review of systems is negative, except for the pertinent positives and negatives detailed in the HPI.  Past Medical History: Past Medical History:  Diagnosis Date   Migraine    currently active, uses Excedrin   Pseudotumor cerebri     Past Surgical History: Past Surgical History:  Procedure Laterality Date   ATTEMPTED BTL-HAD TO ABORT PROCEDURE  10-2014   CYST EXCISION     EXCISION MASS UPPER EXTREMETIES Right 03/14/2018   Procedure: EXCISION ELBOW MASS;  Surgeon: Kristi Kail, MD;  Location: [facility] ORS;  Service: Orthopedics;  Laterality: Right;   GANGLION CYST EXCISION Left 12/26/2015   Procedure: REMOVAL GANGLION OF WRIST;  Surgeon: Kristi Leys, MD;  Location: [facility] ORS;   Service: Orthopedics;  Laterality: Left;   GANGLION CYST EXCISION Left 04/20/2016   Procedure: REMOVAL GANGLION OF WRIST;  Surgeon: Kristi Kail, MD;  Location: [facility] ORS;  Service: Orthopedics;  Laterality: Left;   IR INJECT/THERA/INC NEEDLE/CATH/PLC EPI/LUMB/SAC W/IMG  05/15/2022   IR INJECT/THERA/INC NEEDLE/CATH/PLC EPI/LUMB/SAC W/IMG  05/15/2022   LAPAROSCOPIC OVARIAN CYSTECTOMY Left 04/14/2018   Procedure: LAPAROSCOPIC OVARIAN CYSTECTOMY;  Surgeon: Kristi Fellers, MD;  Location: [facility] ORS;  Service: Gynecology;  Laterality: Left;  peritubal cyst   LAPAROSCOPIC TUBAL LIGATION Bilateral 03/01/2015   Procedure: LAPAROSCOPIC TUBAL LIGATION;  Surgeon: Kristi Bonnet, MD;  Location: [facility] OR[facility];  Service: Gynecology;  Laterality: Bilateral;   LAPAROSCOPIC UNILATERAL SALPINGECTOMY Left 04/14/2018   Procedure: LAPAROSCOPIC UNILATERAL SALPINGECTOMY;  Surgeon: Kristi Fellers, MD;  Location: [facility] ORS;  Service: Gynecology;  Laterality: Left;   MOUTH SURGERY     TUBAL LIGATION      Allergies: Allergies as of 05/14/2022   (No Known Allergies)    Medications: Current Meds  Medication Sig   cyclobenzaprine (FLEXERIL) 5 MG tablet Take 5 mg by mouth 3 (three) times daily as needed.   gabapentin (NEURONTIN) 300 MG capsule Take 300 mg by mouth 3 (three) times daily.   ketorolac (TORADOL) 10 MG tablet Take 1 tablet (10 mg total) by mouth every 6 (six) hours as needed.   oxyCODONE (OXY IR/ROXICODONE) 5 MG immediate release tablet Take 5 mg by mouth 3 (three) times daily as needed.    Social  History: Social History   Tobacco Use   Smoking status: Never   Smokeless tobacco: Never  Vaping Use   Vaping Use: Never used  Substance Use Topics   Alcohol use: Yes    Comment: occ   Drug use: No    Family Medical History: Family History  Problem Relation Age of Onset   Arthritis Mother    Diabetes Mother        type 2   Breast cancer Sister 23       pat 1/2 sister    Physical  Examination: Vitals:   05/18/22 0313 05/18/22 0755  BP: 102/62 114/66  Pulse: 81 72  Resp: 20 18  Temp: (!) 97.5 F (36.4 C) 97.8 F (36.6 C)  SpO2: 100% 100%    General: Patient is well developed, well nourished, calm, collected, and in no apparent distress. Attention to examination is appropriate.  Neck:   Supple.  Full range of motion.  Respiratory: Patient is breathing without any difficulty.   NEUROLOGICAL:     Awake, alert, oriented to person, place, and time.  Speech is clear and fluent. Fund of knowledge is appropriate.   Cranial Nerves: Pupils equal round and reactive to light.  Facial tone is symmetric.  Facial sensation is symmetric. Shoulder shrug is symmetric. Tongue protrusion is midline.  There is no pronator drift.    Strength: Side Biceps Triceps Deltoid Interossei Grip Wrist Ext. Wrist Flex.  R '5 5 5 5 5 5 5  '$ L '5 5 5 5 5 5 5   '$ Side Iliopsoas Quads Hamstring PF DF EHL  R '5 5 5 5 5 5  '$ L '5 5 5 5 3 '$ 4-   Reflexes are 1+ and symmetric at the biceps, triceps, brachioradialis, patella and achilles.   Hoffman's is absent.  Clonus is not present.  Toes are down-going.  Bilateral upper and lower extremity sensation is intact to light touch except L L4 and L5 distribution which is diminished No evidence of dysmetria noted.  Gait is untested.    Medical Decision Making  Imaging: MRI L spine 05/15/22 IMPRESSION: 1. Large left subarticular disc extrusion with superior migration at L4-L5 may affect the left L4 or L5 nerve roots.     Electronically Signed   By: Kristi Gutierrez M.D.   On: 05/15/2022 00:04    I have personally reviewed the images and agree with the above interpretation.  Assessment and Plan: Kristi Gutierrez is a pleasant 41 y.o. female with severe left-sided L4 and L5 radiculopathies.  She has had weakness for a month and a half per her report.  She has a large disc herniation which could cause this.  She has no incontinence.  I recommended that  she continue conservative management for now.  She is 3 days out from epidural steroid injection, which Kristi hopefully kick in in the next few days.  This may allow her to pursue conservative management.  I have strongly recommended that she consider weight loss.  She is currently above a level where it is safe to proceed with elective spine surgery intervention.  My normal goal would be BMI of under 40 to consider elective microdiscectomy.  Given her persistent weakness over the past 6 weeks, I do not think that surgical intervention at this time would harbor a great chance of improving her motor outcome.    Thank you for involving me in the care of this patient.      Hallis Meditz K. Izora Ribas MD, [place]  Neurosurgery

## 2022-05-18 NOTE — Progress Notes (Signed)
Progress Note   Patient: Kristi Gutierrez DUK:[id number] DOB: July 05, 1981 DOA: 05/14/2022     2 DOS: the patient was seen and examined on 05/18/2022   Brief hospital course: 41 year old female with history of morbid obesity BMI 55, known herniated disc followed by PCP at [facility] presented with acute on chronic low back pain following a fall earlier in the day.  She reported pain very severe in intensity and radiating down to the back of the left leg causing difficulty with ambulation.  She has numbness and weakness of the left leg with a pins-and-needles sensation in the leg, loss of sensation in the foot.  No bladder or bowel retention on continence or numbness in the groin area.  Active straight leg raise about 30 degrees.  In ED: MRI -- "1. Large left subarticular disc extrusion with superior migration at L4-L5 may affect the left L4 or L5 nerve roots. MRI at [facility] on 8/7 that showed a similar finding as follows: L4-L5: Bulging disc and large left subarticular/partially foraminal disc extrusion effacing the left lateral recess and compressing the left-sided traversing nerve roots, contributing to severe left neural foraminal narrowing. Findings are compounded by mild facet arthrosis and endplate osteophytes. Mild right neural foraminal narrowing."  Patient was treated with Solu-Medrol and Percocet, fentanyl, Ativan.  Hospitalist consulted for admission for pain control.  Neurosurgeon was consulted by Dr. Karolee Stamps not a surgical candidate.  IR was consulted and performed ESI and nerve block on 8/22.    PT evaluated, recommended Outpatient PT after discharge. DME has been ordered including wheelchair, rolling walker and shower stool.   Pain management continues to be challenging.  Patient requiring IV and oral therapies with no relief as of yet.         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 Increase Lyrica to 200 mg twice daily  Change Flexeril to Zanaflex for muscle relaxant Start Cymbalta Using mostly IV morphine but minimal oral oxycodone - will schedule oxycodone, PRN oxy and use IV morphine only for breakthrough pain Treated with systemic steroids but stopped given no improvement PT recommends outpatient PT Fall precautions IR performed epidural steroid injection and nerve block on 8/22 No cord compression seen on MRI  Neurosurgery consulted at pt request  Morbid obesity with BMI of 50.0-59.9, adult (HCC) Body mass index is 55.85 kg/m. Complicates overall care and prognosis.  Recommend lifestyle modifications including physical activity and diet for weight loss and overall long-term health.   Left lumbar radiculopathy See intractable back pain. Gabapentin changed to Lyrica, increased dose  Fall at home, initial encounter With subsequent acute on chronic back pain as outlined  Ambulatory dysfunction Due to lumbar disc herniation and radiculopathy. PT recommended outpatient PT. DME has been ordered including wheelchair, rolling walker and shower stool.  Herniated intervertebral disc of lumbar spine See intractable pain. Status post ESI and nerve block by interventional radiology on 8/22.        Subjective:  Pt reports no improvement in pain control.  Having severe low back and left hip muscle spasms, says almost constant.  Reports she'd had several falls at home with attempts to ambulate bc her left leg just gives out.  Reports feeling desperate for relief and asks to talk to neurosurgery about options if not improving soon.  Physical Exam: Vitals:   05/17/22 1932 05/18/22 0313 05/18/22 0755 05/18/22 1536  BP: 116/86  102/62 114/66 (!) 112/54  Pulse: 89 81 72 88  Resp: '20 20 18 18  '$ Temp: 98 F (36.7 C) (!) 97.5 F (36.4 C) 97.8 F (36.6 C) 97.8 F (36.6 C)  TempSrc: Oral Oral Oral    SpO2: 100% 100% 100% 99%  Weight:      Height:       General exam: awake, alert, no acute distress, morbidly obese, frequently writhing in pain Respiratory system: On room air, normal respiratory effort. Cardiovascular system: RRR, no significant peripheral edema.   Central nervous system: A&O x4.  Straight leg raise positive before 10 degrees, diminished sensation of the left foot, otherwise no gross focal neurologic deficits, normal speech Extremities: no edema, normal tone Psychiatry: Anxious mood, congruent affect, judgement and insight appear normal    Data Reviewed: No new labs today   Family Communication: None at bedside on rounds, will attempt to call  Disposition: Status is: Inpatient Remains inpatient appropriate because: Remains with uncontrolled pain and requiring IV therapies    Planned Discharge Destination: Home    Time spent: 40 minutes  Author: Ezekiel Slocumb, DO 05/18/2022 4:56 PM  For on call review [email or web address].

## 2022-05-18 NOTE — Progress Notes (Signed)
End of shift [id number]  Pt transitioned to PO pain medications today. Lidocaine patch was applied on her left leg. Zanaflex was added to pt's regiment. PT&OT worked with pt today and neurosurgery was consulted. VSS.

## 2022-05-18 NOTE — Progress Notes (Signed)
Mobility Specialist - Progress Note    05/18/22 1100  Mobility  Activity Ambulated with assistance in hallway;Stood at bedside;Dangled on edge of bed  Level of Assistance Contact guard assist, steadying assist  Assistive Device Front wheel walker  Distance Ambulated (ft) 90 ft  Activity Response Tolerated fair  $Mobility charge 1 Mobility    Pt lying supine upon arrival using RA. Pt completes bed mobility ModI + extra time and heavy use from rails. Completes STS MinA. Ambulates 56f with supervision and 1 LOB correct by author, CGA for remainder of gait.Heavy use of UE and and PWB on L LE (voices pain throughout). 2 additional LOB noted upon entry to room. Pt is left in bed with needs in reach, RN notified.  MMerrily BrittleMobility Specialist 05/18/22, 11:24 AM

## 2022-05-19 MED ORDER — LIDOCAINE 4 % EX CREA: TOPICAL_CREAM | Freq: Three times a day (TID) | CUTANEOUS | Status: DC | PRN

## 2022-05-19 MED ORDER — OXYCODONE HCL 10 MG PO TABS: 10.0000 mg | ORAL_TABLET | Freq: Four times a day (QID) | ORAL | 0 refills | Status: DC

## 2022-05-19 MED ORDER — ACETAMINOPHEN 500 MG PO TABS
1000.0000 mg | ORAL_TABLET | Freq: Three times a day (TID) | ORAL | 0 refills | Status: AC
Start: 2022-05-19 — End: ?

## 2022-05-19 MED ORDER — PREGABALIN 200 MG PO CAPS: 200.0000 mg | ORAL_CAPSULE | Freq: Two times a day (BID) | ORAL | 2 refills | Status: DC

## 2022-05-19 MED ORDER — TIZANIDINE HCL 4 MG PO TABS: 4.0000 mg | ORAL_TABLET | Freq: Three times a day (TID) | ORAL | 2 refills | Status: DC

## 2022-05-19 MED ORDER — ACETAMINOPHEN 500 MG PO TABS: 1000.0000 mg | ORAL_TABLET | Freq: Three times a day (TID) | ORAL | 0 refills | Status: DC

## 2022-05-19 MED ORDER — NAPROXEN 500 MG PO TABS: 500.0000 mg | ORAL_TABLET | Freq: Two times a day (BID) | ORAL | Status: DC

## 2022-05-19 MED ORDER — DULOXETINE HCL 30 MG PO CPEP: 30.0000 mg | ORAL_CAPSULE | Freq: Every day | ORAL | 2 refills | Status: DC

## 2022-05-19 MED ORDER — NAPROXEN 500 MG PO TABS: 500.0000 mg | ORAL_TABLET | Freq: Two times a day (BID) | ORAL | Status: AC

## 2022-05-19 MED ORDER — LIDOCAINE 4 % EX CREA: TOPICAL_CREAM | Freq: Three times a day (TID) | CUTANEOUS | 0 refills | Status: DC | PRN

## 2022-05-19 MED ORDER — POLYETHYLENE GLYCOL 3350 17 G PO PACK: 17.0000 g | PACK | Freq: Every day | ORAL | 0 refills | Status: AC

## 2022-05-19 MED ORDER — DULOXETINE HCL 30 MG PO CPEP
30.0000 mg | ORAL_CAPSULE | Freq: Every day | ORAL | 2 refills | Status: DC
Start: 2022-05-20 — End: 2022-05-19

## 2022-05-19 MED ORDER — SENNOSIDES-DOCUSATE SODIUM 8.6-50 MG PO TABS: 1.0000 | ORAL_TABLET | Freq: Two times a day (BID) | ORAL | Status: AC

## 2022-05-19 MED ORDER — SENNOSIDES-DOCUSATE SODIUM 8.6-50 MG PO TABS: 1.0000 | ORAL_TABLET | Freq: Two times a day (BID) | ORAL | Status: DC

## 2022-05-19 MED ORDER — PREGABALIN 200 MG PO CAPS
200.0000 mg | ORAL_CAPSULE | Freq: Two times a day (BID) | ORAL | 2 refills | Status: DC
Start: 2022-05-19 — End: 2022-05-20

## 2022-05-19 MED ORDER — TIZANIDINE HCL 4 MG PO TABS
4.0000 mg | ORAL_TABLET | Freq: Three times a day (TID) | ORAL | 2 refills | Status: DC
Start: 2022-05-19 — End: 2022-05-19

## 2022-05-19 MED ORDER — POLYETHYLENE GLYCOL 3350 17 G PO PACK: 17.0000 g | PACK | Freq: Every day | ORAL | 0 refills | Status: DC

## 2022-05-19 NOTE — Plan of Care (Signed)

## 2022-05-19 NOTE — TOC Transition Note (Addendum)
eTransition of Care [facility]) - CM/SW Discharge Note   Patient Details  Name: Kristi Gutierrez MRN: [id number] Date of Birth: [birth year]  [facility] CM/SW Contact:  Kristi Ivan, Kristi Gutierrez Phone Number: 05/19/2022, 12:09 PM   Clinical Narrative:    Patient to DC home today. Provided and explained [facility] and Medication Management application. Provided and explained [place] letter. Patient states she has $12 for copays for her 4 medicines. Provided Good Rx coupon in case [place] letter will not cover Oxycodone.  Per chart review, DME was already arranged by [facility] earlier in admission. Patient to follow up with OPPT after she has been seen by neuro/ortho/spinal. Patient denies additional needs.   3:24- RNCM Kristi Gutierrez received call from [facility] stating they no longer take [facility] letters.  DO sent prescriptions to [facility] which is also on [employer]. Updated patient.   4:17- RNCM Kristi Gutierrez received call from [facility] stating they no longer take [facility] letters. [facility] and [facility] are the only pharmacies on the list for [place] that are open that take [facility] letters. They stated to private pay for the medications with a Good RX coupon would be $409. CSW called [facility] Supervisor, waiting to hear back.   4:52- CSW called [place] to see if they will accept [facility] letter. They stated they are not sure what it is but would be happy to try to process it.  Per [facility] Supervisor On Call, patient to take [facility] letter to [place] [facility] has confirmed [facility] letter and that [facility] will cover medications.  Per DO, patient has been gone too long and system will not let her send prescriptions to [facility], but patient can call [facility] and ask them to transfer them. [place] closes at 6. CSW attempted call to patient to inform her of this. No answer. Left VM.    5:01- CSW called patient's daughter Kristi Gutierrez. She stated [facility] kept the [facility] letter. Informed  her patient will need [facility] and have [facility] to [facility], and then will need to take [facility] letter to [employer] before they close at 6. She verbalized understanding.   Final next level of care: Home/Self Care Barriers to Discharge: Barriers Resolved   Patient Goals and CMS Choice Patient states their goals for this hospitalization and ongoing recovery are:: to return home with family CMS Medicare.gov Compare Post Acute Care list provided to:: Patient Choice offered to / list presented to : Patient  Discharge Placement                Patient to be transferred to facility by: daughter Name of family member notified: patient Patient and family notified of of transfer: 05/19/22  Discharge Plan and Services                DME Arranged: Shower stool, Walker rolling, Wheelchair manual DME Agency: Curator spoke with at DME Agency: arranged by [facility] earlier in admission for home delivery            Social Determinants of Health (SDOH) Interventions     Readmission Risk Interventions     No data to display

## 2022-05-19 NOTE — Discharge Summary (Signed)
Physician Discharge Summary   Patient: Kristi Gutierrez MRN: [id number] DOB: [birth year]  Admit date:     05/14/2022  Discharge date: 05/19/2022  Discharge Physician: Ezekiel Slocumb   PCP: Lorelee Market, MD   Recommendations at discharge:   Follow up with Primary Care in 1-2 weeks Follow up as needed with Neurosurgery  Repeat CBC, BMP in 1-2 weeks Monitor closely for efficacy of medications for pain control, side effects, continuation of therapies  Discharge Diagnoses: Principal Problem:   Intractable pain Active Problems:   Morbid obesity with BMI of 50.0-59.9, adult ([place])   Herniated intervertebral disc of lumbar spine   Ambulatory dysfunction   Fall at home, initial encounter   Left lumbar radiculopathy  Resolved Problems:   * No resolved hospital problems. *  Hospital Course: 41 year old female with history of morbid obesity BMI 55, known herniated disc followed by PCP at [facility] presented with acute on chronic low back pain following a fall earlier in the day.  She reported pain very severe in intensity and radiating down to the back of the left leg causing difficulty with ambulation.  She has numbness and weakness of the left leg with a pins-and-needles sensation in the leg, loss of sensation in the foot.  No bladder or bowel retention on continence or numbness in the groin area.  Active straight leg raise about 30 degrees.  In ED: MRI -- "1. Large left subarticular disc extrusion with superior migration at L4-L5 may affect the left L4 or L5 nerve roots. MRI at [facility] on 8/7 that showed a similar finding as follows: L4-L5: Bulging disc and large left subarticular/partially foraminal disc extrusion effacing the left lateral recess and compressing the left-sided traversing nerve roots, contributing to severe left neural foraminal narrowing. Findings are compounded by mild facet arthrosis and endplate osteophytes. Mild right neural foraminal narrowing."  Patient was treated  with Solu-Medrol and Percocet, fentanyl, Ativan.  Hospitalist consulted for admission for pain control.  Neurosurgeon was consulted by Dr. Karolee Stamps not a surgical candidate.  IR was consulted and performed ESI and nerve block on 8/22.    PT evaluated, recommended Outpatient PT after discharge. DME has been ordered including wheelchair, rolling walker and shower stool.  Seen by neurosurgery, recommended to give injection more time for improvement, continue supportive care.  No emergent indication for surgery at this time.   Pain management was challenging and patient reported minimal improvement on multi-modal regimen of medications.   She has improved to no longer requiring IV pain medications. Stable and agreeable to discharge home with outpatient follow up.         Assessment and Plan: * Intractable pain Herniated lumbar disc with left lumbar radiculopathy Accidental fall with frequent falls Ambulatory dysfunction Multi-modal pain control regimen: --Lyrica 200 mg twice daily  --Zanaflex for muscle relaxant --Cymbalta --PRN oral oxycodone --Tylenol and naproxen --Weaned off IV morphine with scheduled oxycodone --Treated with systemic steroids but stopped given no improvement --PT recommends outpatient PT --Fall precautions --IR performed epidural steroid injection and nerve block on 8/22 --No cord compression seen on MRI  --Neurosurgery consulted at pt request - see their consult note.  Morbid obesity with BMI of 50.0-59.9, adult (HCC) Body mass index is 55.85 kg/m. Complicates overall care and prognosis.  Recommend lifestyle modifications including physical activity and diet for weight loss and overall long-term health.   Left lumbar radiculopathy See intractable back pain. Gabapentin changed to Lyrica, increased dose  Fall at home, initial encounter With subsequent  acute on chronic back pain as outlined  Ambulatory dysfunction Due to lumbar disc herniation  and radiculopathy. PT recommended outpatient PT. DME has been ordered including wheelchair, rolling walker and shower stool.  Herniated intervertebral disc of lumbar spine See intractable pain. Status post ESI and nerve block by interventional radiology on 8/22.         Consultants: Neurosurgery Procedures performed: ESI, nerve block  Disposition: Home Diet recommendation:  Cardiac and Carb modified diet DISCHARGE MEDICATION:  NOTE -- auto-generated medication list as below is incorrect.  Patient was discharged with oxycodone. See "Intractable pain" above.   Allergies as of 05/19/2022   No Known Allergies      Medication List     STOP taking these medications    cyclobenzaprine 5 MG tablet Commonly known as: FLEXERIL   gabapentin 300 MG capsule Commonly known as: NEURONTIN   ketorolac 10 MG tablet Commonly known as: TORADOL   oxyCODONE 5 MG immediate release tablet Commonly known as: Oxy IR/ROXICODONE   Tucks 50 % Pads       TAKE these medications    acetaminophen 500 MG tablet Commonly known as: TYLENOL Take 2 tablets (1,000 mg total) by mouth 3 (three) times daily.   naproxen 500 MG tablet Commonly known as: NAPROSYN Take 1 tablet (500 mg total) by mouth 2 (two) times daily with a meal.   polyethylene glycol 17 g packet Commonly known as: MIRALAX / GLYCOLAX Take 17 g by mouth daily.   senna-docusate 8.6-50 MG tablet Commonly known as: Senokot-S Take 1 tablet by mouth 2 (two) times daily.        Follow-up Information     Meade Maw, MD. Call.   Specialty: Neurosurgery Why: As needed, If symptoms worsen Contact information: [address] [address] [phone]         Lorelee Market, MD. Call.   Specialty: Family Medicine Why: PCP follow up post-admission and on new medications Contact information: [place] [address] [phone]         [facility].  Call.   Specialty: Primary Care Why: Local clinic for primary care.  Application provided. Contact information: [address] [place] [phone]               Discharge Exam: Kristi Gutierrez Weights   05/14/22 2122  Weight: (!) 156.9 kg   General exam: awake, alert, no acute distress, obese HEENT: atraumatic, clear conjunctiva, anicteric sclera, moist mucus membranes, hearing grossly normal  Respiratory system: CTAB, ormal respiratory effort. Cardiovascular system: normal S1/S2,  RRR Central nervous system: A&O x3. no gross focal neurologic deficits, normal speech Skin: dry, intact, normal temperature Psychiatry: depressed mood, congruent affect, judgement and insight appear normal   Condition at discharge: stable  The results of significant diagnostics from this hospitalization (including imaging, microbiology, ancillary and laboratory) are listed below for reference.   Imaging Studies: IR INJECT DIAG/THERA/INC NEEDLE/CATH/PLC EPI/LUMB/SAC W/IMG  Result Date: 05/15/2022 CLINICAL DATA:  41 year old female with severe left lower extremity L4 and L5 radiculopathy secondary to a large left subarticular disc extrusion with superior migration at L4-L5. She presents from the emergency department for left L4 and left L5 nerve root block with transforaminal steroid injection. EXAM: IR INJECT/THERA/INC NEEDLE/CATH/PLC EPI/LUMB/SAC W/IMG FLUOROSCOPY TIME:  Radiation exposure index: 77.1 mGy reference air kerma PROCEDURE: The procedure, risks, benefits, and alternatives were explained to the patient. Questions regarding the procedure were encouraged and answered. The  patient understands and consents to the procedure. LEFT L4 NERVE ROOT BLOCK AND TRANSFORAMINAL EPIDURAL: A posterior oblique approach was taken to the intervertebral foramen on the left at L4 using a curved 22 gauge spinal needle. Injection of Omnipaque 180 outlined the L4 nerve root and showed good  epidural spread. No vascular opacification is seen. 80 mg of Depo-Medrol mixed with 2 mL 1% lidocaine were instilled. The procedure was well-tolerated, and the patient was discharged thirty minutes following the injection in good condition. LEFT L5 NERVE ROOT BLOCK AND TRANSFORAMINAL EPIDURAL: A posterior oblique approach was taken to the intervertebral foramen on the left at L5 using a curved 22 gauge spinal needle. Injection of Omnipaque 180 outlined the L5 nerve root and showed good epidural spread. No vascular opacification is seen. 80 mg of Depo-Medrol mixed with 2 mL 1% lidocaine were instilled. The procedure was well-tolerated, and the patient was discharged thirty minutes following the injection in good condition. COMPLICATIONS: None IMPRESSION: Technically successful injection consisting of left L4 and L5 nerve root block and transforaminal epidural injections. Electronically Signed   By: Jacqulynn Cadet M.D.   On: 05/15/2022 16:30   IR INJECT DIAG/THERA/INC NEEDLE/CATH/PLC EPI/LUMB/SAC W/IMG  Result Date: 05/15/2022 CLINICAL DATA:  41 year old female with severe left lower extremity L4 and L5 radiculopathy secondary to a large left subarticular disc extrusion with superior migration at L4-L5. She presents from the emergency department for left L4 and left L5 nerve root block with transforaminal steroid injection. EXAM: IR INJECT/THERA/INC NEEDLE/CATH/PLC EPI/LUMB/SAC W/IMG FLUOROSCOPY TIME:  Radiation exposure index: 77.1 mGy reference air kerma PROCEDURE: The procedure, risks, benefits, and alternatives were explained to the patient. Questions regarding the procedure were encouraged and answered. The patient understands and consents to the procedure. LEFT L4 NERVE ROOT BLOCK AND TRANSFORAMINAL EPIDURAL: A posterior oblique approach was taken to the intervertebral foramen on the left at L4 using a curved 22 gauge spinal needle. Injection of Omnipaque 180 outlined the L4 nerve root and showed good  epidural spread. No vascular opacification is seen. 80 mg of Depo-Medrol mixed with 2 mL 1% lidocaine were instilled. The procedure was well-tolerated, and the patient was discharged thirty minutes following the injection in good condition. LEFT L5 NERVE ROOT BLOCK AND TRANSFORAMINAL EPIDURAL: A posterior oblique approach was taken to the intervertebral foramen on the left at L5 using a curved 22 gauge spinal needle. Injection of Omnipaque 180 outlined the L5 nerve root and showed good epidural spread. No vascular opacification is seen. 80 mg of Depo-Medrol mixed with 2 mL 1% lidocaine were instilled. The procedure was well-tolerated, and the patient was discharged thirty minutes following the injection in good condition. COMPLICATIONS: None IMPRESSION: Technically successful injection consisting of left L4 and L5 nerve root block and transforaminal epidural injections. Electronically Signed   By: Jacqulynn Cadet M.D.   On: 05/15/2022 16:30   MR LUMBAR SPINE WO CONTRAST  Result Date: 05/15/2022 CLINICAL DATA:  Low back pain.  Fall. EXAM: MRI LUMBAR SPINE WITHOUT CONTRAST TECHNIQUE: Multiplanar, multisequence MR imaging of the lumbar spine was performed. No intravenous contrast was administered. COMPARISON:  None Available. FINDINGS: Segmentation:  Standard. Alignment:  Physiologic. Vertebrae:  No fracture, evidence of discitis, or bone lesion. Conus medullaris and cauda equina: Conus extends to the L2 level. Conus and cauda equina appear normal. Paraspinal and other soft tissues: Negative. Disc levels: L1-L2: Normal disc space and facet joints. No spinal canal stenosis. No neural foraminal stenosis. L2-L3: Normal disc space and facet joints. No  spinal canal stenosis. No neural foraminal stenosis. L3-L4: Normal disc space and facet joints. No spinal canal stenosis. No neural foraminal stenosis. L4-L5: Large left subarticular disc extrusion with superior migration. Left lateral recess narrowing without central  spinal canal stenosis. Moderate left neural foraminal stenosis. L5-S1: Normal disc space and facet joints. No spinal canal stenosis. No neural foraminal stenosis. Visualized sacrum: Normal. IMPRESSION: 1. Large left subarticular disc extrusion with superior migration at L4-L5 may affect the left L4 or L5 nerve roots. Electronically Signed   By: Ulyses Jarred M.D.   On: 05/15/2022 00:04    Microbiology: Results for orders placed or performed during the hospital encounter of 07/22/18  Urine Culture     Status: Abnormal   Collection Time: 07/22/18 10:21 PM   Specimen: Urine, Clean Catch  Result Value Ref Range Status   Specimen Description   Final    URINE, CLEAN CATCH Performed at [facility], [address], [place], [place] [address]    Special Requests   Final    Normal Performed at [facility], [address] [place], [place] [address]    Culture MULTIPLE SPECIES PRESENT, SUGGEST RECOLLECTION (A)  Final   Report Status 07/24/2018 FINAL  Final    Labs: CBC: No results for input(s): "WBC", "NEUTROABS", "HGB", "HCT", "MCV", "PLT" in the last 168 hours.  Basic Metabolic Panel: No results for input(s): "NA", "K", "CL", "CO2", "GLUCOSE", "BUN", "CREATININE", "CALCIUM", "MG", "PHOS" in the last 168 hours.  Liver Function Tests: No results for input(s): "AST", "ALT", "ALKPHOS", "BILITOT", "PROT", "ALBUMIN" in the last 168 hours. CBG: No results for input(s): "GLUCAP" in the last 168 hours.  Discharge time spent: greater than 30 minutes.  Signed: Ezekiel Slocumb, DO [facility] 05/29/2022

## 2022-05-20 ENCOUNTER — Other Ambulatory Visit: Payer: Self-pay | Admitting: Internal Medicine

## 2022-05-20 MED ORDER — DULOXETINE HCL 30 MG PO CPEP: 30.0000 mg | ORAL_CAPSULE | Freq: Every day | ORAL | 2 refills | Status: DC

## 2022-05-20 MED ORDER — PREGABALIN 200 MG PO CAPS: 200.0000 mg | ORAL_CAPSULE | Freq: Two times a day (BID) | ORAL | 2 refills | Status: AC

## 2022-05-20 MED ORDER — LIDOCAINE 4 % EX CREA: TOPICAL_CREAM | Freq: Three times a day (TID) | CUTANEOUS | 0 refills | Status: AC | PRN

## 2022-05-20 MED ORDER — TIZANIDINE HCL 4 MG PO TABS: 4.0000 mg | ORAL_TABLET | Freq: Three times a day (TID) | ORAL | 2 refills | Status: DC

## 2022-05-20 MED ORDER — OXYCODONE HCL 10 MG PO TABS
10.0000 mg | ORAL_TABLET | Freq: Four times a day (QID) | ORAL | 0 refills | Status: AC
Start: 2022-05-20 — End: 2022-05-30

## 2022-05-20 NOTE — Progress Notes (Signed)
Notified by [facility] that prior two pharmacies would not accept [place] letter and patient was unable to get prescriptions.  It was requested that I send them to [facility].  Same medications prescribed at d/c on 8/26 now being sent to [facility].

## 2022-05-20 NOTE — TOC CM/SW Note (Addendum)
8/27 9:35 am Spoke to patient via phone. Patient states [facility] told her they "can't and won't" transfer her prescriptions to [facility], and that they did not provide any of her medications, and they kept the [facility] letter. Reached out to [place] for guidance.  11:07 am Per TOC Leadership, reached out to [place] On call about situation to see if scripts can be sent to [facility] without patient still being in hospital. [facility] Physician On call is checking.   11:16 am [facility] Physician sent prescriptions to [facility]. CSW called [facility]ie [facility], spoke to [place] in Pharmacy. Provided [place] letter information. Misty stated they got it to go through on 2 medications but not the 2 controlled substances which stated they need prior authorization. 3 way call with [facility] Supervisor then CSW called number provided by [facility] for Pro care. Spoke with Tiffany at [facility] who reports issues with DEA codes, option for changing DEA codes or for requesting prior auth through her supervisor who would then forward it to the account manager which would not occur until Monday. Tiffany stated the other option is to change the medication until Monday. Notified TOC Supervisor.   11:55 am Call from patient's daughter who stated she is at [facility] who is saying we need to send the prescriptions to [facility]. Provided update that we are working on it. She requested a call when we have an update. Notified TOC Supervisor.   1:15 pm Spoke to [facility] Physician with [facility] Director present who advised Attending from yesterday to send prescriptions to [facility]. Notified Attending.   1:46 pm Attending sent prescriptions to [facility]. Attempted call to [place], they are closed til 2 for lunch.   2:03 pm [employer], spoke to [place] who stated it is still showing up that they need a prior auth on both controlled substances. Updated TOC Director.   4:11 pm RNCM attempted to override in [facility] system. CSW called [facility], spoke to [place] who  stated it is still showing that she needs a prior auth for the 2 controlled substances.  Updated TOC Director and Attending. Per [facility] Director, Pharmacy Director is going to try to follow up on the pre auth tomorrow.  Updated patient. Patient states she is going to go and try to private pay for the 2 controlled substances. She will try [facility] to see if that will reduce the price as well.    Oleh Genin, [place]

## 2022-06-06 ENCOUNTER — Ambulatory Visit
Admission: RE | Admit: 2022-06-06 | Discharge: 2022-06-06 | Disposition: A | Discharge status: Home / Self Care | Payer: Self-pay | Source: Ambulatory Visit | Attending: Orthopedic Surgery | Admitting: Orthopedic Surgery

## 2022-06-06 ENCOUNTER — Other Ambulatory Visit: Payer: Self-pay

## 2022-06-06 DIAGNOSIS — Z049 Encounter for examination and observation for unspecified reason: Secondary | ICD-10-CM

## 2022-06-26 ENCOUNTER — Ambulatory Visit: Payer: Self-pay | Admitting: Neurosurgery

## 2022-07-02 ENCOUNTER — Ambulatory Visit: Payer: Medicaid Other

## 2022-07-18 DIAGNOSIS — K029 Dental caries, unspecified: Secondary | ICD-10-CM | POA: Insufficient documentation

## 2022-07-26 ENCOUNTER — Ambulatory Visit (LOCAL_COMMUNITY_HEALTH_CENTER): Payer: Self-pay

## 2022-07-26 DIAGNOSIS — Z23 Encounter for immunization: Secondary | ICD-10-CM

## 2022-07-26 DIAGNOSIS — Z7185 Encounter for immunization safety counseling: Secondary | ICD-10-CM

## 2022-07-26 NOTE — Progress Notes (Signed)
  Are you feeling sick today? No   Have you ever received a dose of COVID-19 Vaccine? [employer], [place], [place], [place], Other) Yes  If yes, which vaccine and how many doses?     Moderna Did you bring the vaccination record card or other documentation?  Yes   Do you have a health condition or are undergoing treatment that makes you moderately or severely immunocompromised? This would include, but not be limited to: cancer, HIV, organ transplant, immunosuppressive therapy/high-dose corticosteroids, or moderate/severe primary immunodeficiency.  No  Have you received COVID-19 vaccine before or during hematopoietic cell transplant (HCT) or CAR-T-cell therapies? No  Have you ever had an allergic reaction to: (This would include a severe allergic reaction or a reaction that caused hives, swelling, or respiratory distress, including wheezing.) A component of a COVID-19 vaccine or a previous dose of COVID-19 vaccine? No   Have you ever had an allergic reaction to another vaccine (other thanCOVID-19 vaccine) or an injectable medication? (This would include a severe allergic reaction or a reaction that caused hives, swelling, or respiratory distress, including wheezing.)   No    Do you have a history of any of the following:  Myocarditis or Pericarditis No  Dermal fillers:  No  Multisystem Inflammatory Syndrome (MIS-C or MIS-A)? No  COVID-19 disease within the past 3 months? No  Vaccinated with monkeypox vaccine in the last 4 weeks? No   In nurse clinic today for Covid vaccine(Moderna)Tolerated well [place]. [place]. [facility] updated and copy given to pt. Pt asked if her son could also get his Covid vaccine while they were here. Added to schedule ,sent to get him registered.

## 2022-07-30 ENCOUNTER — Emergency Department
Admission: EM | Admit: 2022-07-30 | Discharge: 2022-07-30 | Disposition: A | Discharge status: Home / Self Care | Payer: Medicaid Other | Attending: Emergency Medicine | Admitting: Emergency Medicine

## 2022-07-30 ENCOUNTER — Encounter: Payer: Self-pay | Admitting: Intensive Care

## 2022-07-30 ENCOUNTER — Other Ambulatory Visit: Payer: Self-pay

## 2022-07-30 DIAGNOSIS — M5442 Lumbago with sciatica, left side: Secondary | ICD-10-CM | POA: Insufficient documentation

## 2022-07-30 DIAGNOSIS — M545 Low back pain, unspecified: Secondary | ICD-10-CM | POA: Diagnosis present

## 2022-07-30 DIAGNOSIS — G8929 Other chronic pain: Secondary | ICD-10-CM | POA: Diagnosis not present

## 2022-07-30 MED ORDER — KETOROLAC TROMETHAMINE 30 MG/ML IJ SOLN
30.0000 mg | Freq: Once | INTRAMUSCULAR | Status: AC
  Administered 2022-07-30: 30 mg via INTRAMUSCULAR
  Filled 2022-07-30: qty 1

## 2022-07-30 MED ORDER — HYDROMORPHONE HCL 1 MG/ML IJ SOLN
1.0000 mg | INTRAMUSCULAR | Status: AC
  Administered 2022-07-30: 1 mg via INTRAMUSCULAR
  Filled 2022-07-30: qty 1

## 2022-07-30 MED ORDER — ACETAMINOPHEN 500 MG PO TABS
1000.0000 mg | ORAL_TABLET | Freq: Once | ORAL | Status: DC
  Filled 2022-07-30: qty 2

## 2022-07-30 MED ORDER — OXYCODONE HCL 5 MG PO TABS
10.0000 mg | ORAL_TABLET | Freq: Once | ORAL | Status: AC
  Administered 2022-07-30: 10 mg via ORAL
  Filled 2022-07-30: qty 2

## 2022-07-30 MED ORDER — PREDNISONE 10 MG (21) PO TBPK: ORAL_TABLET | ORAL | 0 refills | Status: DC

## 2022-07-30 NOTE — ED Triage Notes (Signed)
Patient reports left lower back pain that radiates down left leg. Takes muscle relaxer's, Cymbalta and lyrical.   Reports she was walking out of store yesterday and her left leg gave out and caused her to fall. Reports it is hard to feel her left leg. Patient crying in triage. Reports she uses walker and wheelchair at home

## 2022-07-30 NOTE — Discharge Instructions (Signed)
Please call and schedule follow-up appointment with your neurosurgeon.  You may also discuss pain management referral by your primary care provider.  This may be something you need to try and get set up right away.  Take the prednisone as prescribed.  You may also continue your daily medications.

## 2022-07-30 NOTE — ED Provider Notes (Signed)
[facility] Provider Note    Event Date/Time   First MD Initiated Contact with Patient 07/30/22 1421     (approximate)   History   Back Pain and Leg Pain   HPI  Kristi Gutierrez is a 41 y.o. female with history of herniated lumbar disc and lumbar radiculopathy since April presents to the emergency department for acute on chronic back pain that radiates into the left leg.  Symptoms worsened yesterday without specific injury or cause.  No relief with over-the-counter medications and rest.     Physical Exam   Triage Vital Signs: ED Triage Vitals  Enc Vitals Group     BP 07/30/22 1320 124/82     Pulse Rate 07/30/22 1320 88     Resp 07/30/22 1320 20     Temp 07/30/22 1320 98.3 F (36.8 C)     Temp Source 07/30/22 1320 Oral     SpO2 07/30/22 1320 97 %     Weight 07/30/22 1317 (!) 342 lb (155.1 kg)     Height 07/30/22 1317 5' 6.5" (1.689 m)     Head Circumference --      Peak Flow --      Pain Score 07/30/22 1317 10     Pain Loc --      Pain Edu? --      Excl. in [place]? --     Most recent vital signs: Vitals:   07/30/22 1320 07/30/22 1734  BP: 124/82 (!) 140/97  Pulse: 88 80  Resp: 20 19  Temp: 98.3 F (36.8 C) (!) 97.3 F (36.3 C)  SpO2: 97% 95%     General: Awake, no distress.  CV:  Good peripheral perfusion.  Resp:  Normal effort.  Abd:  No distention.  Other:  Able to demonstrate full range of motion of the lower extremities.  Motor and sensation is intact.   ED Results / Procedures / Treatments   Labs (all labs ordered are listed, but only abnormal results are displayed) Labs Reviewed - No data to display   EKG  Not indicated   RADIOLOGY Not indicated   PROCEDURES:  Critical Care performed: No  Procedures   MEDICATIONS ORDERED IN ED: Medications  acetaminophen (TYLENOL) tablet 1,000 mg (1,000 mg Oral Patient Refused/Not Given 07/30/22 1712)  ketorolac (TORADOL) 30 MG/ML injection 30 mg (30 mg Intramuscular  Given 07/30/22 1525)  oxyCODONE (Oxy IR/ROXICODONE) immediate release tablet 10 mg (10 mg Oral Given 07/30/22 1524)  HYDROmorphone (DILAUDID) injection 1 mg (1 mg Intramuscular Given 07/30/22 1618)     IMPRESSION / MDM / ASSESSMENT AND PLAN / ED COURSE  I reviewed the triage vital signs and the nursing notes.  Differential diagnosis includes, but is not limited to, acute on chronic back pain, lumbar radiculopathy, disc herniation  Patient's presentation is most consistent with severe exacerbation of chronic illness.  40 year old female presenting to the emergency department for treatment and evaluation of back pain that radiates into the left leg.  See HPI for further details.  Outside record review shows that she was evaluated by orthopedics and September 2023 after having an MR in August.  At that time she was advised that she may be a candidate for surgery due to her disc herniation, however she would require significant weight loss before she would be a good candidate.  Today she presents with acute on chronic pain.  She tells me there is no difference in her symptoms today except the level of pain.  Toradol and p.o. Oxy IR ordered.  No improvement with medications, IM dilaudid ordered and then she will be discharged home with Rx for prednisone. She will also be encouraged to follow up with her neurosurgeon/primary care provider to request a pain management referral.   FINAL CLINICAL IMPRESSION(S) / ED DIAGNOSES   Final diagnoses:  Chronic left-sided low back pain with left-sided sciatica     Rx / DC Orders   ED Discharge Orders          Ordered    predniSONE (STERAPRED UNI-PAK 21 TAB) 10 MG (21) TBPK tablet        07/30/22 1610             Note:  This document was prepared using Dragon voice recognition software and may include unintentional dictation errors.   [name], FNP 07/30/22 1851    Lavonia Drafts, MD 08/01/22 [phone]

## 2022-10-06 ENCOUNTER — Emergency Department
Admission: EM | Admit: 2022-10-06 | Discharge: 2022-10-07 | Disposition: A | Discharge status: Home / Self Care | Payer: Medicaid Other | Attending: Emergency Medicine | Admitting: Emergency Medicine

## 2022-10-06 ENCOUNTER — Encounter: Payer: Self-pay | Admitting: Radiology

## 2022-10-06 ENCOUNTER — Emergency Department: Payer: Medicaid Other

## 2022-10-06 DIAGNOSIS — X509XXA Other and unspecified overexertion or strenuous movements or postures, initial encounter: Secondary | ICD-10-CM | POA: Insufficient documentation

## 2022-10-06 DIAGNOSIS — M5126 Other intervertebral disc displacement, lumbar region: Secondary | ICD-10-CM | POA: Diagnosis not present

## 2022-10-06 DIAGNOSIS — M545 Low back pain, unspecified: Secondary | ICD-10-CM | POA: Diagnosis present

## 2022-10-06 DIAGNOSIS — M5136 Other intervertebral disc degeneration, lumbar region: Secondary | ICD-10-CM

## 2022-10-06 DIAGNOSIS — S39012A Strain of muscle, fascia and tendon of lower back, initial encounter: Secondary | ICD-10-CM | POA: Insufficient documentation

## 2022-10-06 MED ORDER — METHOCARBAMOL 500 MG PO TABS
1000.0000 mg | ORAL_TABLET | Freq: Once | ORAL | Status: AC
  Administered 2022-10-07: 1000 mg via ORAL
  Filled 2022-10-06: qty 2

## 2022-10-06 MED ORDER — MORPHINE SULFATE (PF) 4 MG/ML IV SOLN
4.0000 mg | Freq: Once | INTRAVENOUS | Status: AC
  Administered 2022-10-06: 4 mg via INTRAVENOUS
  Filled 2022-10-06: qty 1

## 2022-10-06 MED ORDER — PREDNISONE 50 MG PO TABS: 50.0000 mg | ORAL_TABLET | Freq: Every day | ORAL | 0 refills | Status: DC

## 2022-10-06 MED ORDER — OXYCODONE-ACETAMINOPHEN 5-325 MG PO TABS: 1.0000 | ORAL_TABLET | Freq: Four times a day (QID) | ORAL | 0 refills | Status: DC | PRN

## 2022-10-06 MED ORDER — KETOROLAC TROMETHAMINE 30 MG/ML IJ SOLN
30.0000 mg | Freq: Once | INTRAMUSCULAR | Status: AC
  Administered 2022-10-07: 30 mg via INTRAVENOUS
  Filled 2022-10-06: qty 1

## 2022-10-06 MED ORDER — KETOROLAC TROMETHAMINE 10 MG PO TABS: 10.0000 mg | ORAL_TABLET | Freq: Four times a day (QID) | ORAL | 0 refills | Status: DC | PRN

## 2022-10-06 MED ORDER — ONDANSETRON HCL 4 MG/2ML IJ SOLN
4.0000 mg | Freq: Once | INTRAMUSCULAR | Status: AC
  Administered 2022-10-06: 4 mg via INTRAVENOUS
  Filled 2022-10-06: qty 2

## 2022-10-06 MED ORDER — METHOCARBAMOL 500 MG PO TABS: 500.0000 mg | ORAL_TABLET | Freq: Four times a day (QID) | ORAL | 0 refills | Status: AC

## 2022-10-06 MED ORDER — DEXAMETHASONE SODIUM PHOSPHATE 10 MG/ML IJ SOLN
10.0000 mg | Freq: Once | INTRAMUSCULAR | Status: AC
  Administered 2022-10-07: 10 mg via INTRAVENOUS
  Filled 2022-10-06: qty 1

## 2022-10-06 NOTE — ED Provider Notes (Signed)
[facility] Provider Note  Patient Contact: 9:00 PM (approximate)   History   Back Pain (Patient is here tonight for acute on chronic back pain; She states that she was reaching for something on the ground and felt something pop in her back; She reports no relief with Ibuprofen, Tylenol, or the Gabapentin that she takes at home)   HPI  Kristi Gutierrez is a 42 y.o. female who presents the emergency department complaining of sharp low back pain with symptoms down the left leg.  Patient has a history of chronic back issues with herniated disc.  Patient has increased pain with increased numbness and tingling down the left leg.  No bowel or bladder dysfunction, saddle anesthesia.  Patient on anti-inflammatories, muscle relaxer, gabapentin already.     Physical Exam   Triage Vital Signs: ED Triage Vitals  Enc Vitals Group     BP 10/06/22 1830 120/73     Pulse Rate 10/06/22 1830 100     Resp 10/06/22 1830 (!) 22     Temp 10/06/22 1830 98.9 F (37.2 C)     Temp Source 10/06/22 1830 Oral     SpO2 10/06/22 1830 97 %     Weight 10/06/22 1830 (!) 346 lb (156.9 kg)     Height 10/06/22 1830 '5\' 6"'$  (1.676 m)     Head Circumference --      Peak Flow --      Pain Score 10/06/22 1838 10     Pain Loc --      Pain Edu? --      Excl. in [place]? --     Most recent vital signs: Vitals:   10/06/22 1830 10/07/22 0019  BP: 120/73 122/78  Pulse: 100 89  Resp: (!) 22 20  Temp: 98.9 F (37.2 C) 98.2 F (36.8 C)  SpO2: 97% 98%     General: Alert and in no acute distress.  Cardiovascular:  Good peripheral perfusion Respiratory: Normal respiratory effort without tachypnea or retractions. Lungs CTAB.  Gastrointestinal: Bowel sounds 4 quadrants. Soft and nontender to palpation. No guarding or rigidity. No palpable masses. No distention. No CVA tenderness. Musculoskeletal: Full range of motion to all extremities.  Visualization of lumbar spine reveals no visible signs  of trauma.  Diffuse tenderness in the lumbar region and left paraspinal muscle region.  Extension into the SI joint and sciatic notch.  Decreased strength to the left lower extremity when compared to the right.  Patient states that she cannot move the left lower extremity but is freely moving it during my conversation with her.  Sensation and pulses intact distally. Neurologic:  No gross focal neurologic deficits are appreciated.  Skin:   No rash noted Other:   ED Results / Procedures / Treatments   Labs (all labs ordered are listed, but only abnormal results are displayed) Labs Reviewed - No data to display   EKG     RADIOLOGY  I personally viewed, evaluated, and interpreted these images as part of my medical decision making, as well as reviewing the written report by the radiologist.  ED Provider Interpretation: Bulging disc without cord compression.  Compared to previous MRI there is interval improvement.  No concerning findings on MRI such as cauda equina  MR LUMBAR SPINE WO CONTRAST  Result Date: 10/06/2022 CLINICAL DATA:  Initial evaluation for low back pain with left lower extremity radicular symptoms. EXAM: MRI LUMBAR SPINE WITHOUT CONTRAST TECHNIQUE: Multiplanar, multisequence MR imaging of the lumbar spine  was performed. No intravenous contrast was administered. COMPARISON:  Prior study from 05/14/2022. FINDINGS: Segmentation: Standard. Lowest well-formed disc space labeled the L5-S1 level. Alignment: Physiologic with preservation of the normal lumbar lordosis. No listhesis. Vertebrae: Vertebral body height maintained without acute or chronic fracture. Bone marrow signal intensity within normal limits. No discrete or worrisome osseous lesions or abnormal marrow edema. Conus medullaris and cauda equina: Conus extends to the L2 level. Conus and cauda equina appear normal. Incidental note made of a small fatty filum terminale. Paraspinal and other soft tissues: Unremarkable. Disc  levels: L1-2:  Unremarkable. L2-3:  Unremarkable. L3-4: Degenerative intervertebral disc space narrowing with diffuse disc bulge and disc desiccation. No significant spinal stenosis. Foramina remain patent. L4-5: Diffuse disc bulge with disc desiccation. Previously seen left subarticular disc extrusion is no longer visualized. Superimposed left foraminal to extraforaminal disc protrusion with annular fissure contacts the exiting left L4 nerve root (series 8, image 27). Mild facet hypertrophy. No significant spinal stenosis. Mild right with moderate left L4 foraminal stenosis. L5-S1: Disc desiccation. Small central disc protrusion contacts and/or closely approximates the descending S1 nerve roots, slightly asymmetric to the left. Associated annular fissure. No frank neural impingement or displacement. Mild facet spurring. No canal or lateral recess stenosis. Foramina remain patent. IMPRESSION: 1. Left foraminal to extraforaminal disc protrusion at L4-5, contacting and potentially irritating the exiting left L4 nerve root. Previously seen left subarticular disc extrusion at this level has largely resolved. 2. Small central disc protrusion at L5-S1, closely approximating and potentially irritating either of the descending S1 nerve roots, slightly asymmetric to the left. 3. Mild noncompressive disc bulging at L3-4 without stenosis or impingement. Electronically Signed   By: Jeannine Boga M.D.   On: 10/06/2022 23:20    PROCEDURES:  Critical Care performed: No  Procedures   MEDICATIONS ORDERED IN ED: Medications  morphine (PF) 4 MG/ML injection 4 mg (4 mg Intravenous Given 10/06/22 2153)  ondansetron (ZOFRAN) injection 4 mg (4 mg Intravenous Given 10/06/22 2154)  morphine (PF) 4 MG/ML injection 4 mg (4 mg Intravenous Given 10/06/22 2328)  ketorolac (TORADOL) 30 MG/ML injection 30 mg (30 mg Intravenous Given 10/07/22 0019)  dexamethasone (DECADRON) injection 10 mg (10 mg Intravenous Given 10/07/22 0020)   methocarbamol (ROBAXIN) tablet 1,000 mg (1,000 mg Oral Given 10/07/22 0020)     IMPRESSION / MDM / ASSESSMENT AND PLAN / ED COURSE  I reviewed the triage vital signs and the nursing notes.                                 Differential diagnosis includes, but is not limited to, lumbago, lumbar strain, sciatica, herniated disc, cauda equina  Patient's presentation is most consistent with acute presentation with potential threat to life or bodily function.   Patient's diagnosis is consistent with lumbar strain.  Patient presents emergency department with history of herniated disc in her back.  Patient has findings on MRI consistent with herniated disc but there has been interval improvement since her last MRI.  Patient is neurologically intact at this time.  Patient will be prescribed symptom control medications at home.  Follow-up with neurosurgery.  Return precautions discussed with the patient.   Patient is given ED precautions to return to the ED for any worsening or new symptoms.     FINAL CLINICAL IMPRESSION(S) / ED DIAGNOSES   Final diagnoses:  Strain of lumbar region, initial encounter  Bulging lumbar disc  Rx / DC Orders   ED Discharge Orders          Ordered    oxyCODONE-acetaminophen (PERCOCET/ROXICET) 5-325 MG tablet  Every 6 hours PRN        10/06/22 2340    methocarbamol (ROBAXIN) 500 MG tablet  4 times daily        10/06/22 2340    ketorolac (TORADOL) 10 MG tablet  Every 6 hours PRN        10/06/22 2340    predniSONE (DELTASONE) 50 MG tablet  Daily with breakfast        10/06/22 2340             Note:  This document was prepared using Dragon voice recognition software and may include unintentional dictation errors.   Darletta Moll, PA-C 10/07/22 0046    Blake Divine, MD 10/07/22 1112

## 2022-10-06 NOTE — ED Triage Notes (Signed)
Patient is here tonight for acute on chronic back pain; She states that she was reaching for something on the ground and felt something pop in her back; She reports no relief with Ibuprofen, Tylenol, or the Gabapentin that she takes at home

## 2022-10-07 NOTE — ED Notes (Signed)
Patient verbalizes understanding of discharge instructions. Opportunity for questioning and answers were provided. Armband removed by staff, pt discharged from ED. Wheeled out to lobby  

## 2022-10-22 ENCOUNTER — Ambulatory Visit (INDEPENDENT_AMBULATORY_CARE_PROVIDER_SITE_OTHER): Payer: Medicaid Other | Admitting: Internal Medicine

## 2022-10-22 VITALS — BP 140/85 | HR 81 | Resp 16 | Ht 66.5 in | Wt 336.0 lb

## 2022-10-22 DIAGNOSIS — G4733 Obstructive sleep apnea (adult) (pediatric): Secondary | ICD-10-CM

## 2022-10-22 DIAGNOSIS — Z6841 Body Mass Index (BMI) 40.0 and over, adult: Secondary | ICD-10-CM

## 2022-10-22 NOTE — Progress Notes (Signed)
Sleep Medicine   Office Visit  Patient Name: Kristi Gutierrez DOB: July 11, 1981 MRN [id number]    Chief Complaint: sleep evaluation  Brief History:  Kristi Gutierrez presents with a long history of loud snoring.  Sleep quality is poor. This is noted most nights. The patient's bed partner reports  loud snoring at night. The patient relates the following symptoms: loud snoring, frequent awakenings, difficulty returning back to sleep, daytime sleepiness are also present. The patient goes to sleep at 11pm and wakes up at 6:45am.   Sleep quality is worse when outside home environment.  Patient has noted no restlessness of her legs at night.  The patient  relates no unusual behavior during the night.  The patient denies a history of psychiatric problems. The Epworth Sleepiness Score is 0 out of 24 .  The patient relates  Cardiovascular risk factors include: none    ROS  General: (-) fever, (-) chills, (-) night sweat Nose and Sinuses: (-) nasal stuffiness or itchiness, (-) postnasal drip, (-) nosebleeds, (-) sinus trouble. Mouth and Throat: (-) sore throat, (-) hoarseness. Neck: (-) swollen glands, (-) enlarged thyroid, (-) neck pain. Respiratory: - cough, - shortness of breath, - wheezing. Neurologic: +numbness, - tingling. Left leg due to back issues.  Psychiatric: - anxiety, - depression Sleep behavior: -sleep paralysis -hypnogogic hallucinations -dream enactment      -vivid dreams -cataplexy -night terrors -sleep walking   Current Medication: Outpatient Encounter Medications as of 10/22/2022  Medication Sig   DULoxetine (CYMBALTA) 30 MG capsule Take by mouth.   gabapentin (NEURONTIN) 300 MG capsule Take by mouth.   metFORMIN (GLUCOPHAGE-XR) 500 MG 24 hr tablet Take by mouth.   acetaminophen (TYLENOL) 500 MG tablet Take 2 tablets (1,000 mg total) by mouth 3 (three) times daily.   acetaminophen (TYLENOL) 500 MG tablet Take by mouth.   lidocaine (LMX) 4 % cream Apply topically 3 (three) times  daily as needed (low back and hip pain).   methocarbamol (ROBAXIN) 500 MG tablet Take 1 tablet (500 mg total) by mouth 4 (four) times daily.   naproxen (NAPROSYN) 500 MG tablet Take 1 tablet (500 mg total) by mouth 2 (two) times daily with a meal.   polyethylene glycol (MIRALAX / GLYCOLAX) 17 g packet Take 17 g by mouth daily.   pregabalin (LYRICA) 200 MG capsule Take 1 capsule (200 mg total) by mouth 2 (two) times daily.   senna-docusate (SENOKOT-S) 8.6-50 MG tablet Take 1 tablet by mouth 2 (two) times daily.   [DISCONTINUED] DULoxetine (CYMBALTA) 30 MG capsule Take 1 capsule (30 mg total) by mouth daily.   [DISCONTINUED] ketorolac (TORADOL) 10 MG tablet Take 1 tablet (10 mg total) by mouth every 6 (six) hours as needed.   [DISCONTINUED] oxyCODONE-acetaminophen (PERCOCET/ROXICET) 5-325 MG tablet Take 1 tablet by mouth every 6 (six) hours as needed for severe pain.   [DISCONTINUED] predniSONE (DELTASONE) 50 MG tablet Take 1 tablet (50 mg total) by mouth daily with breakfast.   [DISCONTINUED] tiZANidine (ZANAFLEX) 4 MG tablet Take 1 tablet (4 mg total) by mouth 3 (three) times daily.   No facility-administered encounter medications on file as of 10/22/2022.    Surgical History: Past Surgical History:  Procedure Laterality Date   ATTEMPTED BTL-HAD TO ABORT PROCEDURE  10-2014   CYST EXCISION     EXCISION MASS UPPER EXTREMETIES Right 03/14/2018   Procedure: EXCISION ELBOW MASS;  Surgeon: Leanor Kail, MD;  Location: [facility] ORS;  Service: Orthopedics;  Laterality: Right;   GANGLION CYST EXCISION  Left 12/26/2015   Procedure: REMOVAL GANGLION OF WRIST;  Surgeon: Earnestine Leys, MD;  Location: [facility] OR[facility];  Service: Orthopedics;  Laterality: Left;   GANGLION CYST EXCISION Left 04/20/2016   Procedure: REMOVAL GANGLION OF WRIST;  Surgeon: Leanor Kail, MD;  Location: [facility] OR[facility];  Service: Orthopedics;  Laterality: Left;   IR INJECT/THERA/INC NEEDLE/CATH/PLC EPI/LUMB/SAC W/IMG  05/15/2022   IR  INJECT/THERA/INC NEEDLE/CATH/PLC EPI/LUMB/SAC W/IMG  05/15/2022   LAPAROSCOPIC OVARIAN CYSTECTOMY Left 04/14/2018   Procedure: LAPAROSCOPIC OVARIAN CYSTECTOMY;  Surgeon: Homero Fellers, MD;  Location: [facility] OR[facility];  Service: Gynecology;  Laterality: Left;  peritubal cyst   LAPAROSCOPIC TUBAL LIGATION Bilateral 03/01/2015   Procedure: LAPAROSCOPIC TUBAL LIGATION;  Surgeon: Will Bonnet, MD;  Location: [facility];  Service: Gynecology;  Laterality: Bilateral;   LAPAROSCOPIC UNILATERAL SALPINGECTOMY Left 04/14/2018   Procedure: LAPAROSCOPIC UNILATERAL SALPINGECTOMY;  Surgeon: Homero Fellers, MD;  Location: [facility];  Service: Gynecology;  Laterality: Left;   MOUTH SURGERY     TUBAL LIGATION      Medical History: Past Medical History:  Diagnosis Date   Migraine    currently active, uses Excedrin   Pseudotumor cerebri     Family History: Non contributory to the present illness  Social History: Social History   Socioeconomic History   Marital status: Married    Spouse name: Not on file   Number of children: Not on file   Years of education: Not on file   Highest education level: Not on file  Occupational History   Not on file  Tobacco Use   Smoking status: Never   Smokeless tobacco: Never  Vaping Use   Vaping Use: Never used  Substance and Sexual Activity   Alcohol use: Yes    Comment: occ   Drug use: No   Sexual activity: Yes    Birth control/protection: Other-see comments    Comment: tubal ligation  Other Topics Concern   Not on file  Social History Narrative   Not on file   Social Determinants of Health   Financial Resource Strain: Not on file  Food Insecurity: Not on file  Transportation Needs: Not on file  Physical Activity: Not on file  Stress: Not on file  Social Connections: Not on file  Intimate Partner Violence: Not on file    Vital Signs: Blood pressure (!) 140/85, pulse 81, resp. rate 16, height 5' 6.5" (1.689 m), weight (!) 336 lb (152.4  kg), last menstrual period 05/11/2022, SpO2 99 %. Body mass index is 53.42 kg/m.   Examination: General Appearance: The patient is well-developed, well-nourished, and in no distress. Neck Circumference: 45 cm Skin: Gross inspection of skin unremarkable. Head: normocephalic, no gross deformities. Eyes: no gross deformities noted. ENT: ears appear grossly normal Neurologic: Alert and oriented. No involuntary movements.    STOP BANG RISK ASSESSMENT S (snore) Have you been told that you snore?     YES   T (tired) Are you often tired, fatigued, or sleepy during the day?   YES  O (obstruction) Do you stop breathing, choke, or gasp during sleep? NO   P (pressure) Do you have or are you being treated for high blood pressure? NO   B (BMI) Is your body index greater than 35 kg/m? YES   A (age) Are you 47 years old or older? NO   N (neck) Do you have a neck circumference greater than 16 inches?   YES   G (gender) Are you a female? NO   TOTAL  STOP/BANG "YES" ANSWERS 4                                                               A STOP-Bang score of 2 or less is considered low risk, and a score of 5 or more is high risk for having either moderate or severe OSA. For people who score 3 or 4, doctors may need to perform further assessment to determine how likely they are to have OSA.         EPWORTH SLEEPINESS SCALE:  Scale:  (0)= no chance of dozing; (1)= slight chance of dozing; (2)= moderate chance of dozing; (3)= high chance of dozing  Chance  Situtation    Sitting and reading: 0    Watching TV: 0    Sitting Inactive in public: 0    As a passenger in car: 0      Lying down to rest: 0    Sitting and talking: 0    Sitting quielty after lunch: 0    In a car, stopped in traffic: 0   TOTAL SCORE:   0 out of 24    SLEEP STUDIES:  No prior sleep study    LABS: No results found for this or any previous visit (from the past 2160 hour(s)).  Radiology: MR LUMBAR  SPINE WO CONTRAST  Result Date: 10/06/2022 CLINICAL DATA:  Initial evaluation for low back pain with left lower extremity radicular symptoms. EXAM: MRI LUMBAR SPINE WITHOUT CONTRAST TECHNIQUE: Multiplanar, multisequence MR imaging of the lumbar spine was performed. No intravenous contrast was administered. COMPARISON:  Prior study from 05/14/2022. FINDINGS: Segmentation: Standard. Lowest well-formed disc space labeled the L5-S1 level. Alignment: Physiologic with preservation of the normal lumbar lordosis. No listhesis. Vertebrae: Vertebral body height maintained without acute or chronic fracture. Bone marrow signal intensity within normal limits. No discrete or worrisome osseous lesions or abnormal marrow edema. Conus medullaris and cauda equina: Conus extends to the L2 level. Conus and cauda equina appear normal. Incidental note made of a small fatty filum terminale. Paraspinal and other soft tissues: Unremarkable. Disc levels: L1-2:  Unremarkable. L2-3:  Unremarkable. L3-4: Degenerative intervertebral disc space narrowing with diffuse disc bulge and disc desiccation. No significant spinal stenosis. Foramina remain patent. L4-5: Diffuse disc bulge with disc desiccation. Previously seen left subarticular disc extrusion is no longer visualized. Superimposed left foraminal to extraforaminal disc protrusion with annular fissure contacts the exiting left L4 nerve root (series 8, image 27). Mild facet hypertrophy. No significant spinal stenosis. Mild right with moderate left L4 foraminal stenosis. L5-S1: Disc desiccation. Small central disc protrusion contacts and/or closely approximates the descending S1 nerve roots, slightly asymmetric to the left. Associated annular fissure. No frank neural impingement or displacement. Mild facet spurring. No canal or lateral recess stenosis. Foramina remain patent. IMPRESSION: 1. Left foraminal to extraforaminal disc protrusion at L4-5, contacting and potentially irritating the  exiting left L4 nerve root. Previously seen left subarticular disc extrusion at this level has largely resolved. 2. Small central disc protrusion at L5-S1, closely approximating and potentially irritating either of the descending S1 nerve roots, slightly asymmetric to the left. 3. Mild noncompressive disc bulging at L3-4 without stenosis or impingement. Electronically Signed   By: Jeannine Boga M.D.   On: 10/06/2022 23:20  No results found.  MR LUMBAR SPINE WO CONTRAST  Result Date: 10/06/2022 CLINICAL DATA:  Initial evaluation for low back pain with left lower extremity radicular symptoms. EXAM: MRI LUMBAR SPINE WITHOUT CONTRAST TECHNIQUE: Multiplanar, multisequence MR imaging of the lumbar spine was performed. No intravenous contrast was administered. COMPARISON:  Prior study from 05/14/2022. FINDINGS: Segmentation: Standard. Lowest well-formed disc space labeled the L5-S1 level. Alignment: Physiologic with preservation of the normal lumbar lordosis. No listhesis. Vertebrae: Vertebral body height maintained without acute or chronic fracture. Bone marrow signal intensity within normal limits. No discrete or worrisome osseous lesions or abnormal marrow edema. Conus medullaris and cauda equina: Conus extends to the L2 level. Conus and cauda equina appear normal. Incidental note made of a small fatty filum terminale. Paraspinal and other soft tissues: Unremarkable. Disc levels: L1-2:  Unremarkable. L2-3:  Unremarkable. L3-4: Degenerative intervertebral disc space narrowing with diffuse disc bulge and disc desiccation. No significant spinal stenosis. Foramina remain patent. L4-5: Diffuse disc bulge with disc desiccation. Previously seen left subarticular disc extrusion is no longer visualized. Superimposed left foraminal to extraforaminal disc protrusion with annular fissure contacts the exiting left L4 nerve root (series 8, image 27). Mild facet hypertrophy. No significant spinal stenosis. Mild right  with moderate left L4 foraminal stenosis. L5-S1: Disc desiccation. Small central disc protrusion contacts and/or closely approximates the descending S1 nerve roots, slightly asymmetric to the left. Associated annular fissure. No frank neural impingement or displacement. Mild facet spurring. No canal or lateral recess stenosis. Foramina remain patent. IMPRESSION: 1. Left foraminal to extraforaminal disc protrusion at L4-5, contacting and potentially irritating the exiting left L4 nerve root. Previously seen left subarticular disc extrusion at this level has largely resolved. 2. Small central disc protrusion at L5-S1, closely approximating and potentially irritating either of the descending S1 nerve roots, slightly asymmetric to the left. 3. Mild noncompressive disc bulging at L3-4 without stenosis or impingement. Electronically Signed   By: Jeannine Boga M.D.   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 OSA (obstructive sleep apnea) PLAN OSA:   Patient evaluation suggests high risk of sleep disordered breathing due to morbid obesity, snoring, daytime sleepiness, large neck, frequent awakenings. .   Suggest: PSG to assess  the patient's sleep disordered breathing. The patient was also counselled on weight loss to optimize sleep health.    2. Morbid obesity  with BMI of 50.0-59.9, adult ([place]) Obesity Counseling: Had a lengthy discussion regarding patients BMI and weight issues. Patient was instructed on portion control as well as increased activity. Also discussed caloric restrictions with trying to maintain intake less than 2000 Kcal. Discussions were made in accordance with the 5As of weight management. Simple actions such as not eating late and if able to, taking a walk is suggested.     General Counseling: I have discussed the findings of the evaluation  and examination with Apolonio Schneiders.  I have also discussed any further diagnostic evaluation thatmay be needed or ordered today. Johnette verbalizes understanding of the findings of todays visit. We also reviewed her medications today and discussed drug interactions and side effects including but not limited excessive drowsiness and altered mental states. We also discussed that there is always a risk not just to her but also people around her. she has been encouraged to call the office with any questions or concerns that should arise related to todays visit.  No orders of the defined types were placed in this encounter.       I have personally obtained a history, evaluated the patient, evaluated pertinent data, formulated the assessment and plan and placed orders.   This patient was seen today by Tressie Ellis, PA-C in collaboration with Dr. Devona Konig.   Allyne Gee, MD [facility] Diplomate ABMS Pulmonary and Critical Care Medicine Sleep medicine

## 2022-12-08 ENCOUNTER — Emergency Department: Payer: Medicaid Other

## 2022-12-08 ENCOUNTER — Other Ambulatory Visit: Payer: Self-pay

## 2022-12-08 DIAGNOSIS — J069 Acute upper respiratory infection, unspecified: Secondary | ICD-10-CM | POA: Insufficient documentation

## 2022-12-08 DIAGNOSIS — Z1152 Encounter for screening for COVID-19: Secondary | ICD-10-CM | POA: Insufficient documentation

## 2022-12-08 DIAGNOSIS — R0602 Shortness of breath: Secondary | ICD-10-CM | POA: Diagnosis present

## 2022-12-08 LAB — CBC WITH DIFFERENTIAL/PLATELET
Abs Immature Granulocytes: 0.04 10*3/uL (ref 0.00–0.07)
Basophils Absolute: 0 10*3/uL (ref 0.0–0.1)
Basophils Relative: 0 %
Eosinophils Absolute: 0.3 10*3/uL (ref 0.0–0.5)
Eosinophils Relative: 2 %
HCT: 39.5 % (ref 36.0–46.0)
Hemoglobin: 13 g/dL (ref 12.0–15.0)
Immature Granulocytes: 0 %
Lymphocytes Relative: 25 %
Lymphs Abs: 3 10*3/uL (ref 0.7–4.0)
MCH: 30.2 pg (ref 26.0–34.0)
MCHC: 32.9 g/dL (ref 30.0–36.0)
MCV: 91.6 fL (ref 80.0–100.0)
Monocytes Absolute: 1.1 10*3/uL — ABNORMAL HIGH (ref 0.1–1.0)
Monocytes Relative: 9 %
Neutro Abs: 7.4 10*3/uL (ref 1.7–7.7)
Neutrophils Relative %: 64 %
Platelets: 184 10*3/uL (ref 150–400)
RBC: 4.31 MIL/uL (ref 3.87–5.11)
RDW: 12.3 % (ref 11.5–15.5)
WBC: 11.8 10*3/uL — ABNORMAL HIGH (ref 4.0–10.5)
nRBC: 0 % (ref 0.0–0.2)

## 2022-12-08 LAB — TROPONIN I (HIGH SENSITIVITY): Troponin I (High Sensitivity): 3 ng/L (ref <18)

## 2022-12-08 LAB — COMPREHENSIVE METABOLIC PANEL
ALT: 33 U/L (ref 0–44)
AST: 32 U/L (ref 15–41)
Albumin: 3.6 g/dL (ref 3.5–5.0)
Alkaline Phosphatase: 59 U/L (ref 38–126)
Anion gap: 4 — ABNORMAL LOW (ref 5–15)
BUN: 16 mg/dL (ref 6–20)
CO2: 20 mmol/L — ABNORMAL LOW (ref 22–32)
Calcium: 8.5 mg/dL — ABNORMAL LOW (ref 8.9–10.3)
Chloride: 108 mmol/L (ref 98–111)
Creatinine, Ser: 0.69 mg/dL (ref 0.44–1.00)
GFR, Estimated: >60 mL/min (ref >60)
Glucose, Bld: 119 mg/dL — ABNORMAL HIGH (ref 70–99)
Potassium: 3.6 mmol/L (ref 3.5–5.1)
Sodium: 132 mmol/L — ABNORMAL LOW (ref 135–145)
Total Bilirubin: 0.5 mg/dL (ref 0.3–1.2)
Total Protein: 7.6 g/dL (ref 6.5–8.1)

## 2022-12-08 LAB — RESP PANEL BY RT-PCR (RSV, FLU A&B, COVID)  RVPGX2
Influenza A by PCR: NEGATIVE
Influenza B by PCR: NEGATIVE
Resp Syncytial Virus by PCR: NEGATIVE
SARS Coronavirus 2 by RT PCR: NEGATIVE

## 2022-12-08 NOTE — ED Triage Notes (Signed)
Pt sts that she has had a cold for the last week and that she started having chest pain and sob and coughing for the last week. Pt sts she also coughed up blood earlier tonight.

## 2022-12-08 NOTE — ED Notes (Signed)
Pt came to front desk complaining of chest pain and asked where she was in line, This tech informed pt on how many pt are in front of her and pt said that it was hard for her to breath. This tech stated that I can take her O2 and let the nurse know. Her O2 was 96 percent. Pt back out in waiting room now.

## 2022-12-09 ENCOUNTER — Emergency Department: Payer: Medicaid Other

## 2022-12-09 ENCOUNTER — Emergency Department
Admission: EM | Admit: 2022-12-09 | Discharge: 2022-12-09 | Disposition: A | Discharge status: Home / Self Care | Payer: Medicaid Other | Attending: Emergency Medicine | Admitting: Emergency Medicine

## 2022-12-09 DIAGNOSIS — J069 Acute upper respiratory infection, unspecified: Secondary | ICD-10-CM

## 2022-12-09 DIAGNOSIS — R0602 Shortness of breath: Secondary | ICD-10-CM

## 2022-12-09 LAB — TROPONIN I (HIGH SENSITIVITY): Troponin I (High Sensitivity): <2 ng/L (ref <18)

## 2022-12-09 MED ORDER — ALBUTEROL SULFATE HFA 108 (90 BASE) MCG/ACT IN AERS: INHALATION_SPRAY | RESPIRATORY_TRACT | 0 refills | Status: AC

## 2022-12-09 MED ORDER — PREDNISONE 10 MG PO TABS: ORAL_TABLET | ORAL | 0 refills | Status: AC

## 2022-12-09 MED ORDER — IPRATROPIUM-ALBUTEROL 0.5-2.5 (3) MG/3ML IN SOLN
3.0000 mL | Freq: Once | RESPIRATORY_TRACT | Status: AC
  Administered 2022-12-09: 3 mL via RESPIRATORY_TRACT
  Filled 2022-12-09: qty 3

## 2022-12-09 MED ORDER — METHYLPREDNISOLONE SODIUM SUCC 125 MG IJ SOLR
125.0000 mg | Freq: Once | INTRAMUSCULAR | Status: AC
  Administered 2022-12-09: 125 mg via INTRAVENOUS
  Filled 2022-12-09: qty 2

## 2022-12-09 MED ORDER — AZITHROMYCIN 250 MG PO TABS: ORAL_TABLET | ORAL | 0 refills | Status: AC

## 2022-12-09 MED ORDER — HYDROCOD POLI-CHLORPHE POLI ER 10-8 MG/5ML PO SUER: 5.0000 mL | Freq: Two times a day (BID) | ORAL | 0 refills | Status: AC | PRN

## 2022-12-09 NOTE — ED Provider Notes (Signed)
[facility] Provider Note    Event Date/Time   First MD Initiated Contact with Patient 12/09/22 [phone]     (approximate)   History   Shortness of Breath and Chest Pain   HPI  Kristi Gutierrez is a 42 y.o. female who presents for evaluation of cold-like symptoms for about the last week and then worsening shortness of breath and cough over the last few days and especially earlier today.  She said that hurts when she coughs.  She feels like she cannot take a deep breath.  Nothing in particular makes it better or worse.  It is accompanied with subjective fever and feeling ill in general.  Her symptoms started with fatigue, nasal congestion, runny nose, and a little bit of a sore throat, but most of the symptoms have resolved except for the cough and shortness of breath.  Nothing particular seems to make it better and exertion makes it worse.  She has no history of lung disease and does not smoke.  She has not been having nausea or vomiting and has no abdominal pain.       Physical Exam   Triage Vital Signs: ED Triage Vitals  Enc Vitals Group     BP 12/08/22 2129 (!) 129/101     Pulse Rate 12/08/22 2129 98     Resp 12/08/22 2129 (!) 24     Temp 12/08/22 2136 98.2 F (36.8 C)     Temp Source 12/08/22 2136 Oral     SpO2 12/08/22 2129 98 %     Weight 12/08/22 2129 (!) 154.4 kg (340 lb 6.2 oz)     Height 12/08/22 2129 1.676 m (5\' 6" )     Head Circumference --      Peak Flow --      Pain Score 12/08/22 2128 9     Pain Loc --      Pain Edu? --      Excl. in [place]? --     Most recent vital signs: Vitals:   12/09/22 0207 12/09/22 0230  BP: (!) 141/73 122/61  Pulse: 90 77  Resp: 20 (!) 22  Temp:    SpO2: 98% 100%     General: Awake, generally well-appearing, laying in bed comfortably and watching TV. CV:  Good peripheral perfusion.  Regular rate and rhythm. Resp:  Normal effort.  At baseline at rest, she is not having increased accessory muscle use  usage or work of breathing.  Her lungs actually sound clear although auscultation is somewhat limited by body habitus.  She is not wheezing and she is moving good air although she will not take a deep breath because when she does so it makes her cough.  She has a frequent dry cough. Abd:  No distention.  Morbid obesity.  No tenderness to palpation. Other:  Awake, alert, oriented, appropriately interactive.   ED Results / Procedures / Treatments   Labs (all labs ordered are listed, but only abnormal results are displayed) Labs Reviewed  CBC WITH DIFFERENTIAL/PLATELET - Abnormal; Notable for the following components:      Result Value   WBC 11.8 (*)    Monocytes Absolute 1.1 (*)    All other components within normal limits  COMPREHENSIVE METABOLIC PANEL - Abnormal; Notable for the following components:   Sodium 132 (*)    CO2 20 (*)    Glucose, Bld 119 (*)    Calcium 8.5 (*)    Anion gap 4 (*)  All other components within normal limits  RESP PANEL BY RT-PCR (RSV, FLU A&B, COVID)  [id number]  TROPONIN I (HIGH SENSITIVITY)  TROPONIN I (HIGH SENSITIVITY)     EKG  ED ECG REPORT I, Hinda Kehr, the attending physician, personally viewed and interpreted this ECG.  Date: 12/08/2022 EKG Time: 21:31 Rate: 96 Rhythm: normal sinus rhythm QRS Axis: normal Intervals: normal ST/T Wave abnormalities: Non-specific ST segment / T-wave changes, but no clear evidence of acute ischemia. Narrative Interpretation: no definitive evidence of acute ischemia; does not meet STEMI criteria.    RADIOLOGY I viewed and interpreted the patient's two-view chest x-ray and I see no evidence of pneumonia.  The radiology report concurs.  I also viewed and interpreted the patient's CT chest without contrast.  I see no evidence of interstitial edema or subtle pneumonia not seen on chest x-ray.  Also normal report by the radiologist.    PROCEDURES:  Critical Care performed:  No  Procedures   MEDICATIONS ORDERED IN ED: Medications  ipratropium-albuterol (DUONEB) 0.5-2.5 (3) MG/3ML nebulizer solution 3 mL (3 mLs Nebulization Given 12/09/22 0536)  methylPREDNISolone sodium succinate (SOLU-MEDROL) 125 mg/2 mL injection 125 mg (125 mg Intravenous Given 12/09/22 0535)     IMPRESSION / MDM / ASSESSMENT AND PLAN / ED COURSE  I reviewed the triage vital signs and the nursing notes.                              Differential diagnosis includes, but is not limited to, viral infection, community-acquired pneumonia, new onset CHF, reactive airway disease, less likely PE.  Patient's presentation is most consistent with acute presentation with potential threat to life or bodily function.  Labs/studies ordered: EKG, two-view chest x-ray, chest CT without contrast, CBC with differential, respiratory viral panel, CMP, high-sensitivity troponin x 2 Interventions/Medications given: DuoNeb, Solu-Medrol 125 mg IV [facility] Course my include additional interventions or labs/studies not listed above.)  Patient's vital signs are essentially normal other than some mild tachypnea particular when she starts coughing.  She is not hypoxic and not tachycardic.  She is also not febrile.  Her body habitus makes the exam a little bit more complicated and I suspect it also contributes to her dyspnea.  However, her workup is reassuring.  She has a very slight leukocytosis of 11.8 but is likely not clinically significant.  Respiratory viral panel is negative and chest x-ray does not show any evidence of infection.  The patient is obviously uncomfortable particular when she coughs and is somewhat scared.  I talked with her about it and I think she would benefit from me ordering a CT scan of the chest without contrast to make sure there is no evidence of intraparenchymal disease (fluid or infection) that we cannot see on the chest x-ray.  I do not think she would benefit from a CTA given that she is  PERC negative.  I will also treat with a DuoNeb and Solu-Medrol 125 mg IV which may help with the inflammation from the viral respiratory infection she is most likely suffering.  The patient is on the cardiac monitor to evaluate for evidence of arrhythmia and/or significant heart rate changes.   Clinical Course as of 12/09/22 0549  Nancy Fetter Dec 09, 2022  0522 CT Chest Wo Contrast I viewed and interpreted the patient's CT chest I see no evidence of interstitial pneumonia or pulmonary edema.  Radiology confirmed no acute findings [CF]  0522 Patient still  coughing but remains at 100% on room air.  Slightly tachypneic when she coughs but otherwise is breathing easily normally and has been stable for nearly 8 hours in the emergency department.  I reiterated her reassuring findings and [CF]  0523  attempted to again reassure her about outpatient management.  I am writing prescriptions as listed below she is getting a DuoNeb in the ED along with Solu-Medrol 125 mg IV to try and help formation and cough.  I encouraged close outpatient follow-up and I gave my usual and customary return precautions.    [CF]    Clinical Course User Index [CF] Hinda Kehr, MD     FINAL CLINICAL IMPRESSION(S) / ED DIAGNOSES   Final diagnoses:  Viral URI with cough  Shortness of breath     Rx / DC Orders   ED Discharge Orders          Ordered    azithromycin (ZITHROMAX) 250 MG tablet        12/09/22 0549    predniSONE (DELTASONE) 10 MG tablet        12/09/22 0549    chlorpheniramine-HYDROcodone (TUSSIONEX) 10-8 MG/5ML  Every 12 hours PRN        12/09/22 0549    albuterol (VENTOLIN HFA) 108 (90 Base) MCG/ACT inhaler       Note to Pharmacy: Pharmacy may substitute brand and size for insurance-approved equivalent   12/09/22 0549             Note:  This document was prepared using Dragon voice recognition software and may include unintentional dictation errors.   Hinda Kehr, MD 12/09/22 [phone]

## 2022-12-09 NOTE — ED Notes (Signed)
PT brought to ed rm 7 at this time, this RN now assuming care.

## 2022-12-09 NOTE — ED Notes (Signed)
Report given to Alex RN.

## 2022-12-09 NOTE — Discharge Instructions (Addendum)

## 2023-05-03 NOTE — Progress Notes (Unsigned)
Pt did not show for scheduled appointment.  

## 2023-05-06 ENCOUNTER — Ambulatory Visit: Payer: Medicaid Other | Admitting: Internal Medicine

## 2023-08-09 NOTE — Progress Notes (Unsigned)
[facility] [address] [place], [place] [address]  Pulmonary Sleep Medicine   Office Visit Note  Patient Name: Kristi Gutierrez DOB: September 08, 1981 MRN [id number]    Chief Complaint: Obstructive Sleep Apnea visit  Brief History:  Kristi Gutierrez is seen today for a follow up visit for APAP@ 8-15 cmH2O. The patient has a 9 month history of sleep apnea. Patient is using PAP nightly.  The patient feels rested after sleeping with PAP.  The patient reports benefiting from PAP use. Reported sleepiness is  improved and the Epworth Sleepiness Score is 1 out of 24. The patient does take naps. The patient complains of the following: oral dryness despite adjustment of humidity. Advised BIOTENE rinse and XYLIMELTS  The compliance download shows 82% compliance with an average use time of 5 hours 9 minutes. The AHI is 0.7.  The patient does complain of limb movements disrupting sleep. The patient continues to require PAP therapy in order to eliminate sleep apnea. Patient reports chronic pain which doesn't allow her to sleep more than 5 hours a night.   ROS  General: (-) fever, (-) chills, (-) night sweat Nose and Sinuses: (-) nasal stuffiness or itchiness, (-) postnasal drip, (-) nosebleeds, (-) sinus trouble. Mouth and Throat: (-) sore throat, (-) hoarseness. Neck: (-) swollen glands, (-) enlarged thyroid, (-) neck pain. Respiratory: - cough, - shortness of breath, - wheezing. Neurologic: - numbness, - tingling. Psychiatric: - anxiety, - depression   Current Medication: Outpatient Encounter Medications as of 08/12/2023  Medication Sig   acetaminophen (TYLENOL) 500 MG tablet Take 2 tablets (1,000 mg total) by mouth 3 (three) times daily.   acetaminophen (TYLENOL) 500 MG tablet Take by mouth.   albuterol (VENTOLIN HFA) 108 (90 Base) MCG/ACT inhaler Inhale 2-4 puffs by mouth every 4 hours as needed for wheezing, cough, and/or shortness of breath   azithromycin (ZITHROMAX) 250 MG tablet  Take 2 tablets PO on day 1, then take 1 tablet PO daily for 4 more days   chlorpheniramine-HYDROcodone (TUSSIONEX) 10-8 MG/5ML Take 5 mLs by mouth every 12 (twelve) hours as needed for cough.   DULoxetine (CYMBALTA) 30 MG capsule Take by mouth.   gabapentin (NEURONTIN) 300 MG capsule Take by mouth.   lidocaine (LMX) 4 % cream Apply topically 3 (three) times daily as needed (low back and hip pain).   metFORMIN (GLUCOPHAGE-XR) 500 MG 24 hr tablet Take by mouth.   methocarbamol (ROBAXIN) 500 MG tablet Take 1 tablet (500 mg total) by mouth 4 (four) times daily.   naproxen (NAPROSYN) 500 MG tablet Take 1 tablet (500 mg total) by mouth 2 (two) times daily with a meal.   polyethylene glycol (MIRALAX / GLYCOLAX) 17 g packet Take 17 g by mouth daily.   predniSONE (DELTASONE) 10 MG tablet Take 4 tabs (40 mg) PO x 3 days, then take 2 tabs (20 mg) PO x 3 days, then take 1 tab (10 mg) PO x 3 days, then take 1/2 tab (5 mg) PO x 4 days.   pregabalin (LYRICA) 200 MG capsule Take 1 capsule (200 mg total) by mouth 2 (two) times daily.   senna-docusate (SENOKOT-S) 8.6-50 MG tablet Take 1 tablet by mouth 2 (two) times daily.   No facility-administered encounter medications on file as of 08/12/2023.    Surgical History: Past Surgical History:  Procedure Laterality Date   ATTEMPTED BTL-HAD TO ABORT PROCEDURE  10-2014   CYST EXCISION     EXCISION MASS UPPER EXTREMETIES Right 03/14/2018  Procedure: EXCISION ELBOW MASS;  Surgeon: Erin Sons, MD;  Location: [facility] ORS;  Service: Orthopedics;  Laterality: Right;   GANGLION CYST EXCISION Left 12/26/2015   Procedure: REMOVAL GANGLION OF WRIST;  Surgeon: Deeann Saint, MD;  Location: [facility] OR[facility];  Service: Orthopedics;  Laterality: Left;   GANGLION CYST EXCISION Left 04/20/2016   Procedure: REMOVAL GANGLION OF WRIST;  Surgeon: Erin Sons, MD;  Location: [facility];  Service: Orthopedics;  Laterality: Left;   IR INJECT/THERA/INC NEEDLE/CATH/PLC EPI/LUMB/SAC W/IMG   05/15/2022   IR INJECT/THERA/INC NEEDLE/CATH/PLC EPI/LUMB/SAC W/IMG  05/15/2022   LAPAROSCOPIC OVARIAN CYSTECTOMY Left 04/14/2018   Procedure: LAPAROSCOPIC OVARIAN CYSTECTOMY;  Surgeon: Natale Milch, MD;  Location: [facility] OR[facility];  Service: Gynecology;  Laterality: Left;  peritubal cyst   LAPAROSCOPIC TUBAL LIGATION Bilateral 03/01/2015   Procedure: LAPAROSCOPIC TUBAL LIGATION;  Surgeon: Conard Novak, MD;  Location: [facility] OR[facility];  Service: Gynecology;  Laterality: Bilateral;   LAPAROSCOPIC UNILATERAL SALPINGECTOMY Left 04/14/2018   Procedure: LAPAROSCOPIC UNILATERAL SALPINGECTOMY;  Surgeon: Natale Milch, MD;  Location: [facility];  Service: Gynecology;  Laterality: Left;   MOUTH SURGERY     TUBAL LIGATION      Medical History: Past Medical History:  Diagnosis Date   Migraine    currently active, uses Excedrin   Pseudotumor cerebri     Family History: Non contributory to the present illness  Social History: Social History   Socioeconomic History   Marital status: Married    Spouse name: Not on file   Number of children: Not on file   Years of education: Not on file   Highest education level: Not on file  Occupational History   Not on file  Tobacco Use   Smoking status: Never   Smokeless tobacco: Never  Vaping Use   Vaping status: Never Used  Substance and Sexual Activity   Alcohol use: Yes    Comment: occ   Drug use: No   Sexual activity: Yes    Birth control/protection: Other-see comments    Comment: tubal ligation  Other Topics Concern   Not on file  Social History Narrative   Not on file   Social Determinants of Health   Financial Resource Strain: Medium Risk (05/10/2023)   Received from [facility]   Overall Financial Resource Strain (CARDIA)    Difficulty of Paying Living Expenses: Somewhat hard  Food Insecurity: No Food Insecurity (05/10/2023)   [facility] from [facility]   Hunger Vital Sign    Worried About Running Out of Food in the Last  Year: Never true    Ran Out of Food in the Last Year: Never true  Transportation Needs: No Transportation Needs (05/10/2023)   Received from [facility]   PRAPARE - Transportation    Lack of Transportation (Medical): No    Lack of Transportation (Non-Medical): No  Physical Activity: Not on file  Stress: Not on file  Social Connections: Not on file  Intimate Partner Violence: Not on file    Vital Signs: Blood pressure 100/81, pulse 87, resp. rate 16, height 5' 6.5" (1.689 m), weight (!) 339 lb (153.8 kg), last menstrual period 05/11/2022, SpO2 99%. Body mass index is 53.9 kg/m.    Examination: General Appearance: The patient is well-developed, well-nourished, and in no distress. Neck Circumference: 46cm Skin: Gross inspection of skin unremarkable. Head: normocephalic, no gross deformities. Eyes: no gross deformities noted. ENT: ears appear grossly normal Neurologic: Alert and oriented. No involuntary movements.  STOP BANG RISK ASSESSMENT S (  snore) Have you been told that you snore?     No   T (tired) Are you often tired, fatigued, or sleepy during the day?   YES  O (obstruction) Do you stop breathing, choke, or gasp during sleep? NO   P (pressure) Do you have or are you being treated for high blood pressure? NO   B (BMI) Is your body index greater than 35 kg/m? YES   A (age) Are you 15 years old or older? NO   N (neck) Do you have a neck circumference greater than 16 inches?   YES   G (gender) Are you a female? NO   TOTAL STOP/BANG "YES" ANSWERS 3       A STOP-Bang score of 2 or less is considered low risk, and a score of 5 or more is high risk for having either moderate or severe OSA. For people who score 3 or 4, doctors may need to perform further assessment to determine how likely they are to have OSA.         EPWORTH SLEEPINESS SCALE:  Scale:  (0)= no chance of dozing; (1)= slight chance of dozing; (2)= moderate chance of dozing; (3)= high chance of  dozing  Chance  Situtation    Sitting and reading: 0    Watching TV: 1    Sitting Inactive in public: 0    As a passenger in car: 0      Lying down to rest: 0    Sitting and talking: 0    Sitting quielty after lunch: 0    In a car, stopped in traffic: 0   TOTAL SCORE:   1 out of 24    SLEEP STUDIES:  PSG (10/2022) AHI 30.1/hr, REM AHI 80/hr, limited supine sleep- Supine AHI 125/hr min SpO2 72% Titration (11/2022) APAP@ 8-15 cmH2O   CPAP COMPLIANCE DATA:  Date Range: 04/09/2023-08/06/2023  Average Daily Use: 5 hours 9 minutes  Median Use: 5 hours 15 minutes  Compliance for > 4 Hours: 82%  AHI: 0.7 respiratory events per hour  Days Used: 107/120 days  Mask Leak: 1.8  95th Percentile Pressure: 12.1         LABS: Recent Results (from the past 2160 hour(s))  Comprehensive metabolic panel     Status: Abnormal   Collection Time: 08/11/23  5:11 PM  Result Value Ref Range   Sodium 136 135 - 145 mmol/L   Potassium 3.5 3.5 - 5.1 mmol/L   Chloride 105 98 - 111 mmol/L   CO2 23 22 - 32 mmol/L   Glucose, Bld 98 70 - 99 mg/dL    Comment: Glucose reference range applies only to samples taken after fasting for at least 8 hours.   BUN 13 6 - 20 mg/dL   Creatinine, Ser 8.29 0.44 - 1.00 mg/dL   Calcium 8.6 (L) 8.9 - 10.3 mg/dL   Total Protein 7.8 6.5 - 8.1 g/dL   Albumin 3.7 3.5 - 5.0 g/dL   AST 22 15 - 41 U/L   ALT 32 0 - 44 U/L   Alkaline Phosphatase 69 38 - 126 U/L   Total Bilirubin 0.7 <1.2 mg/dL   GFR, Estimated >56 >21 mL/min    Comment: (NOTE) Calculated using the CKD-EPI Creatinine Equation (2021)    Anion gap 8 5 - 15    Comment: Performed at [facility], [address], [place], [place] [address]  CBC with Differential     Status: None   Collection Time:  08/11/23  5:11 PM  Result Value Ref Range   WBC 8.5 4.0 - 10.5 K/uL   RBC 4.37 3.87 - 5.11 MIL/uL   Hemoglobin 13.4 12.0 - 15.0 g/dL   HCT 47.8 29.5 - 62.1 %   MCV 90.6 80.0 -  100.0 fL   MCH 30.7 26.0 - 34.0 pg   MCHC 33.8 30.0 - 36.0 g/dL   RDW 30.8 65.7 - 84.6 %   Platelets 211 150 - 400 K/uL   nRBC 0.0 0.0 - 0.2 %   Neutrophils Relative % 65 %   Neutro Abs 5.5 1.7 - 7.7 K/uL   Lymphocytes Relative 25 %   Lymphs Abs 2.2 0.7 - 4.0 K/uL   Monocytes Relative 8 %   Monocytes Absolute 0.6 0.1 - 1.0 K/uL   Eosinophils Relative 2 %   Eosinophils Absolute 0.2 0.0 - 0.5 K/uL   Basophils Relative 0 %   Basophils Absolute 0.0 0.0 - 0.1 K/uL   Immature Granulocytes 0 %   Abs Immature Granulocytes 0.03 0.00 - 0.07 K/uL    Comment: Performed at [facility], [address] [place], [place] [address]  Lipase, blood     Status: None   Collection Time: 08/11/23  5:11 PM  Result Value Ref Range   Lipase 45 11 - 51 U/L    Comment: Performed at [facility], [address] [place], [place] [address]    Radiology: CT Chest Wo Contrast  Result Date: 12/09/2022 CLINICAL DATA:  Respiratory illness EXAM: CT CHEST WITHOUT CONTRAST TECHNIQUE: Multidetector CT imaging of the chest was performed following the standard protocol without IV contrast. RADIATION DOSE REDUCTION: This exam was performed according to the departmental dose-optimization program which includes automated exposure control, adjustment of the mA and/or kV according to patient size and/or use of iterative reconstruction technique. COMPARISON:  None Available. FINDINGS: Cardiovascular: No significant vascular findings. Normal heart size. No pericardial effusion. Mediastinum/Nodes: No enlarged mediastinal or axillary lymph nodes. Thyroid gland, trachea, and esophagus demonstrate no significant findings. Lungs/Pleura: Lungs are clear. No pleural effusion or pneumothorax. Upper Abdomen: Negative Musculoskeletal: No acute or aggressive finding IMPRESSION: Negative chest CT. Electronically Signed   By: Tiburcio Pea M.D.   On: 12/09/2022 04:56    No results found.  No results  found.    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 OSA (obstructive sleep apnea) The patient does tolerate PAP and reports  benefit from PAP use. The patient was reminded how to clean equipment and advised to replace supplies routinely. The patient was also counselled on weight loss. The compliance is good. The AHI is .0.7.   OSA on cpap- controlled. Continue with good compliance with pap. CPAP continues to be medically necessary to treat this patient's OSA. F/u one year.     2. CPAP use counseling Pt reports good compliance with CPAP therapy. Cleaning machine by hand,  and changing filters and tubing as directed. Denies headaches, sinus issues, palpitations, or hemoptysis.     3. Morbid obesity with BMI of 50.0-59.9, adult (HCC) Obesity Counseling: Had a lengthy discussion regarding patients BMI and weight issues. Patient was instructed on portion control as well as increased activity. Also discussed caloric restrictions with trying to maintain intake less than 2000 Kcal. Discussions were made in accordance with the 5As of weight management. Simple actions such as not eating late and if able to, taking a walk is suggested.      General Counseling: I have discussed the findings of the evaluation and examination with Fleet Contras.  I have also discussed any further diagnostic evaluation  thatmay be needed or ordered today. Emileigh verbalizes understanding of the findings of todays visit. We also reviewed her medications today and discussed drug interactions and side effects including but not limited excessive drowsiness and altered mental states. We also discussed that there is always a risk not just to her but also people around her. she has been encouraged to call the office with any questions or concerns that should arise related to todays visit.  No orders of the defined types were placed in this encounter.       I have personally obtained a history, examined the patient, evaluated laboratory and imaging results, formulated the assessment and plan and placed orders. This patient was seen today by Emmaline Kluver, PA-C in collaboration with Dr. Freda Munro.   Yevonne Pax, MD [facility] Diplomate ABMS Pulmonary Critical Care Medicine and Sleep Medicine

## 2023-08-11 ENCOUNTER — Emergency Department
Admission: EM | Admit: 2023-08-11 | Discharge: 2023-08-11 | Disposition: A | Discharge status: Against Medical Advice | Payer: Medicaid Other | Attending: Student | Admitting: Student

## 2023-08-11 ENCOUNTER — Other Ambulatory Visit: Payer: Self-pay

## 2023-08-11 DIAGNOSIS — R112 Nausea with vomiting, unspecified: Secondary | ICD-10-CM | POA: Diagnosis present

## 2023-08-11 DIAGNOSIS — R1011 Right upper quadrant pain: Secondary | ICD-10-CM | POA: Diagnosis not present

## 2023-08-11 DIAGNOSIS — R197 Diarrhea, unspecified: Secondary | ICD-10-CM | POA: Diagnosis not present

## 2023-08-11 DIAGNOSIS — Z5321 Procedure and treatment not carried out due to patient leaving prior to being seen by health care provider: Secondary | ICD-10-CM | POA: Diagnosis not present

## 2023-08-11 LAB — COMPREHENSIVE METABOLIC PANEL
ALT: 32 U/L (ref 0–44)
AST: 22 U/L (ref 15–41)
Albumin: 3.7 g/dL (ref 3.5–5.0)
Alkaline Phosphatase: 69 U/L (ref 38–126)
Anion gap: 8 (ref 5–15)
BUN: 13 mg/dL (ref 6–20)
CO2: 23 mmol/L (ref 22–32)
Calcium: 8.6 mg/dL — ABNORMAL LOW (ref 8.9–10.3)
Chloride: 105 mmol/L (ref 98–111)
Creatinine, Ser: 0.76 mg/dL (ref 0.44–1.00)
GFR, Estimated: >60 mL/min (ref >60)
Glucose, Bld: 98 mg/dL (ref 70–99)
Potassium: 3.5 mmol/L (ref 3.5–5.1)
Sodium: 136 mmol/L (ref 135–145)
Total Bilirubin: 0.7 mg/dL (ref <1.2)
Total Protein: 7.8 g/dL (ref 6.5–8.1)

## 2023-08-11 LAB — CBC WITH DIFFERENTIAL/PLATELET
Abs Immature Granulocytes: 0.03 10*3/uL (ref 0.00–0.07)
Basophils Absolute: 0 10*3/uL (ref 0.0–0.1)
Basophils Relative: 0 %
Eosinophils Absolute: 0.2 10*3/uL (ref 0.0–0.5)
Eosinophils Relative: 2 %
HCT: 39.6 % (ref 36.0–46.0)
Hemoglobin: 13.4 g/dL (ref 12.0–15.0)
Immature Granulocytes: 0 %
Lymphocytes Relative: 25 %
Lymphs Abs: 2.2 10*3/uL (ref 0.7–4.0)
MCH: 30.7 pg (ref 26.0–34.0)
MCHC: 33.8 g/dL (ref 30.0–36.0)
MCV: 90.6 fL (ref 80.0–100.0)
Monocytes Absolute: 0.6 10*3/uL (ref 0.1–1.0)
Monocytes Relative: 8 %
Neutro Abs: 5.5 10*3/uL (ref 1.7–7.7)
Neutrophils Relative %: 65 %
Platelets: 211 10*3/uL (ref 150–400)
RBC: 4.37 MIL/uL (ref 3.87–5.11)
RDW: 12.1 % (ref 11.5–15.5)
WBC: 8.5 10*3/uL (ref 4.0–10.5)
nRBC: 0 % (ref 0.0–0.2)

## 2023-08-11 LAB — LIPASE, BLOOD: Lipase: 45 U/L (ref 11–51)

## 2023-08-11 NOTE — ED Triage Notes (Signed)
Pt to ED via POV from home. Pt reports RUQ pain, N/V/D x2 days. Pt states pain 10/10.

## 2023-08-11 NOTE — ED Provider Triage Note (Signed)
Emergency Medicine Provider Triage Evaluation Note  Kristi Gutierrez , a 42 y.o. female  was evaluated in triage.  Pt complains of vomiting since yesterday. No recent travel, new foods. States she has not been able to keep anything down and feels very weak.  Review of Systems  Positive: Vomiting, epigastric pain, diarrhea, chills Negative: fevers  Physical Exam  Ht 5\' 6"  (1.676 m)   Wt (!) 154.2 kg   LMP 05/11/2022 (Exact Date)   BMI 54.88 kg/m  Gen:   Awake, no distress   Resp:  Normal effort  MSK:   Moves extremities without difficulty  Other:    Medical Decision Making  Medically screening exam initiated at 5:01 PM.  Appropriate orders placed.  Edithe Anschutz was informed that the remainder of the evaluation will be completed by another provider, this initial triage assessment does not replace that evaluation, and the importance of remaining in the ED until their evaluation is complete.    Cameron Ali, PA-C 08/11/23 1703

## 2023-08-12 ENCOUNTER — Ambulatory Visit (INDEPENDENT_AMBULATORY_CARE_PROVIDER_SITE_OTHER): Payer: Medicaid Other | Admitting: Internal Medicine

## 2023-08-12 VITALS — BP 100/81 | HR 87 | Resp 16 | Ht 66.5 in | Wt 339.0 lb

## 2023-08-12 DIAGNOSIS — Z7189 Other specified counseling: Secondary | ICD-10-CM

## 2023-08-12 DIAGNOSIS — Z6841 Body Mass Index (BMI) 40.0 and over, adult: Secondary | ICD-10-CM

## 2023-08-12 DIAGNOSIS — G4733 Obstructive sleep apnea (adult) (pediatric): Secondary | ICD-10-CM

## 2023-08-12 NOTE — Patient Instructions (Signed)

## 2023-11-28 ENCOUNTER — Other Ambulatory Visit: Payer: Self-pay

## 2023-11-28 DIAGNOSIS — R109 Unspecified abdominal pain: Secondary | ICD-10-CM | POA: Diagnosis present

## 2023-11-28 DIAGNOSIS — E860 Dehydration: Secondary | ICD-10-CM | POA: Insufficient documentation

## 2023-11-28 NOTE — ED Triage Notes (Signed)
 Pt to ED via POV c/o abdominal pain. Pt had gastric sleeve surgery about a week ago. Abd pain, nausea, and weakness started about 2 days ago. Denies CP, shob, fevers

## 2023-11-29 ENCOUNTER — Emergency Department
Admission: EM | Admit: 2023-11-29 | Discharge: 2023-11-29 | Disposition: A | Discharge status: Home / Self Care | Attending: Emergency Medicine | Admitting: Emergency Medicine

## 2023-11-29 ENCOUNTER — Emergency Department

## 2023-11-29 DIAGNOSIS — E860 Dehydration: Secondary | ICD-10-CM

## 2023-11-29 DIAGNOSIS — R109 Unspecified abdominal pain: Secondary | ICD-10-CM

## 2023-11-29 DIAGNOSIS — R110 Nausea: Secondary | ICD-10-CM

## 2023-11-29 LAB — URINALYSIS, ROUTINE W REFLEX MICROSCOPIC
Bilirubin Urine: NEGATIVE
Glucose, UA: NEGATIVE mg/dL
Hgb urine dipstick: NEGATIVE
Ketones, ur: 20 mg/dL — AB
Leukocytes,Ua: NEGATIVE
Nitrite: NEGATIVE
Protein, ur: NEGATIVE mg/dL
Specific Gravity, Urine: >1.046 — ABNORMAL HIGH (ref 1.005–1.030)
pH: 6 (ref 5.0–8.0)

## 2023-11-29 LAB — COMPREHENSIVE METABOLIC PANEL
ALT: 22 U/L (ref 0–44)
AST: 18 U/L (ref 15–41)
Albumin: 4.2 g/dL (ref 3.5–5.0)
Alkaline Phosphatase: 59 U/L (ref 38–126)
Anion gap: 13 (ref 5–15)
BUN: 10 mg/dL (ref 6–20)
CO2: 16 mmol/L — ABNORMAL LOW (ref 22–32)
Calcium: 9.8 mg/dL (ref 8.9–10.3)
Chloride: 105 mmol/L (ref 98–111)
Creatinine, Ser: 0.61 mg/dL (ref 0.44–1.00)
GFR, Estimated: >60 mL/min (ref >60)
Glucose, Bld: 88 mg/dL (ref 70–99)
Potassium: 3.6 mmol/L (ref 3.5–5.1)
Sodium: 134 mmol/L — ABNORMAL LOW (ref 135–145)
Total Bilirubin: 1.1 mg/dL (ref 0.0–1.2)
Total Protein: 8 g/dL (ref 6.5–8.1)

## 2023-11-29 LAB — LIPASE, BLOOD: Lipase: 65 U/L — ABNORMAL HIGH (ref 11–51)

## 2023-11-29 LAB — CBC
HCT: 43 % (ref 36.0–46.0)
Hemoglobin: 15.2 g/dL — ABNORMAL HIGH (ref 12.0–15.0)
MCH: 30.6 pg (ref 26.0–34.0)
MCHC: 35.3 g/dL (ref 30.0–36.0)
MCV: 86.7 fL (ref 80.0–100.0)
Platelets: 225 10*3/uL (ref 150–400)
RBC: 4.96 MIL/uL (ref 3.87–5.11)
RDW: 12.6 % (ref 11.5–15.5)
WBC: 9 10*3/uL (ref 4.0–10.5)
nRBC: 0 % (ref 0.0–0.2)

## 2023-11-29 MED ORDER — SODIUM CHLORIDE 0.9 % IV BOLUS
1000.0000 mL | Freq: Once | INTRAVENOUS | Status: AC
  Administered 2023-11-29: 1000 mL via INTRAVENOUS

## 2023-11-29 MED ORDER — DIAZEPAM 5 MG/ML IJ SOLN
2.0000 mg | Freq: Once | INTRAMUSCULAR | Status: AC
  Administered 2023-11-29: 2 mg via INTRAVENOUS
  Filled 2023-11-29: qty 2

## 2023-11-29 MED ORDER — IOHEXOL 350 MG/ML SOLN
100.0000 mL | Freq: Once | INTRAVENOUS | Status: AC | PRN
  Administered 2023-11-29: 100 mL via INTRAVENOUS

## 2023-11-29 MED ORDER — OXYCODONE HCL 5 MG PO TABS
10.0000 mg | ORAL_TABLET | Freq: Once | ORAL | Status: AC
  Administered 2023-11-29: 10 mg via ORAL
  Filled 2023-11-29: qty 2

## 2023-11-29 MED ORDER — HYDROMORPHONE HCL 1 MG/ML IJ SOLN
0.5000 mg | Freq: Once | INTRAMUSCULAR | Status: AC
  Administered 2023-11-29: 0.5 mg via INTRAVENOUS
  Filled 2023-11-29: qty 0.5

## 2023-11-29 MED ORDER — DIAZEPAM 1 MG/ML PO SOLN: 2.0000 mg | Freq: Three times a day (TID) | ORAL | 0 refills | Status: AC | PRN

## 2023-11-29 MED ORDER — MORPHINE SULFATE (PF) 4 MG/ML IV SOLN
4.0000 mg | Freq: Once | INTRAVENOUS | Status: AC
  Administered 2023-11-29: 4 mg via INTRAVENOUS
  Filled 2023-11-29: qty 1

## 2023-11-29 MED ORDER — FAMOTIDINE IN NACL 20-0.9 MG/50ML-% IV SOLN
20.0000 mg | Freq: Once | INTRAVENOUS | Status: AC
  Administered 2023-11-29: 20 mg via INTRAVENOUS
  Filled 2023-11-29: qty 50

## 2023-11-29 MED ORDER — LACTULOSE 10 GM/15ML PO SOLN: 20.0000 g | Freq: Every day | ORAL | 0 refills | Status: DC | PRN

## 2023-11-29 MED ORDER — ONDANSETRON HCL 4 MG/2ML IJ SOLN
4.0000 mg | Freq: Once | INTRAMUSCULAR | Status: AC
  Administered 2023-11-29: 4 mg via INTRAVENOUS
  Filled 2023-11-29: qty 2

## 2023-11-29 NOTE — ED Notes (Signed)
 CCMD called to confirm pt is being monitored

## 2023-11-29 NOTE — ED Provider Notes (Signed)
 [facility] Provider Note    Event Date/Time   First MD Initiated Contact with Patient 11/29/23 [phone]     (approximate)   History   Abdominal Pain   HPI  Kristi Gutierrez is a 43 y.o. female who presents to the ED from home with a chief complaint of postoperative abdominal pain.  Patient had gastric sleeve surgery over 1 week ago at [facility].  States she was admitted for 8 days for postoperative pain and nausea.  Discharged home around 2 days ago on liquid pain medications.  States to force herself to take liquids and complains of persistent pain and nausea.  Last BM 4 days ago.  Denies associated fever/chills, cough, chest pain, shortness of breath, vomiting, dysuria or diarrhea.  States she called her surgeon tonight and was instructed to go to her local ER for IV fluids.     Past Medical History   Past Medical History:  Diagnosis Date   Migraine    currently active, uses Excedrin   Pseudotumor cerebri      Active Problem List   Patient Active Problem List   Diagnosis Date Noted   OSA (obstructive sleep apnea) 10/22/2022   Dental caries 07/18/2022   Morbid obesity with BMI of 50.0-59.9, adult (HCC) 05/15/2022   Herniated intervertebral disc of lumbar spine 05/15/2022   Ambulatory dysfunction 05/15/2022   Intractable pain 05/15/2022   Fall at home, initial encounter 05/15/2022   Left lumbar radiculopathy 05/15/2022   Migraine    Benign ganglioneuroma of abdomen 10/13/2019   Intractable back pain 04/28/2018   Morbid obesity (HCC) 09/30/2017   CRP elevated 03/21/2016   Asymptomatic hyperuricemia 03/21/2016   Admission for sterilization 03/01/2015     Past Surgical History   Past Surgical History:  Procedure Laterality Date   ATTEMPTED BTL-HAD TO ABORT PROCEDURE  10-2014   CYST EXCISION     EXCISION MASS UPPER EXTREMETIES Right 03/14/2018   Procedure: EXCISION ELBOW MASS;  Surgeon: Erin Sons, MD;  Location: [facility] ORS;   Service: Orthopedics;  Laterality: Right;   GANGLION CYST EXCISION Left 12/26/2015   Procedure: REMOVAL GANGLION OF WRIST;  Surgeon: Deeann Saint, MD;  Location: [facility] ORS;  Service: Orthopedics;  Laterality: Left;   GANGLION CYST EXCISION Left 04/20/2016   Procedure: REMOVAL GANGLION OF WRIST;  Surgeon: Erin Sons, MD;  Location: [facility] ORS;  Service: Orthopedics;  Laterality: Left;   IR INJECT/THERA/INC NEEDLE/CATH/PLC EPI/LUMB/SAC W/IMG  05/15/2022   IR INJECT/THERA/INC NEEDLE/CATH/PLC EPI/LUMB/SAC W/IMG  05/15/2022   LAPAROSCOPIC OVARIAN CYSTECTOMY Left 04/14/2018   Procedure: LAPAROSCOPIC OVARIAN CYSTECTOMY;  Surgeon: Natale Milch, MD;  Location: [facility] ORS;  Service: Gynecology;  Laterality: Left;  peritubal cyst   LAPAROSCOPIC TUBAL LIGATION Bilateral 03/01/2015   Procedure: LAPAROSCOPIC TUBAL LIGATION;  Surgeon: Conard Novak, MD;  Location: [facility] OR[facility];  Service: Gynecology;  Laterality: Bilateral;   LAPAROSCOPIC UNILATERAL SALPINGECTOMY Left 04/14/2018   Procedure: LAPAROSCOPIC UNILATERAL SALPINGECTOMY;  Surgeon: Natale Milch, MD;  Location: [facility] OR[facility];  Service: Gynecology;  Laterality: Left;   MOUTH SURGERY     TUBAL LIGATION       Home Medications   Prior to Admission medications   Medication Sig Start Date End Date Taking? Authorizing Provider  diazepam (VALIUM) 1 MG/ML solution Take 2 mLs (2 mg total) by mouth every 8 (eight) hours as needed for anxiety or muscle spasms. 11/29/23  Yes Irean Hong, MD  lactulose (CHRONULAC) 10 GM/15ML solution Take 30 mLs (20 g  total) by mouth daily as needed for mild constipation. 11/29/23  Yes Irean Hong, MD  acetaminophen (TYLENOL) 500 MG tablet Take 2 tablets (1,000 mg total) by mouth 3 (three) times daily. 05/19/22   Pennie Banter, DO  acetaminophen (TYLENOL) 500 MG tablet Take by mouth.    [provider]  albuterol (VENTOLIN HFA) 108 (90 Base) MCG/ACT inhaler Inhale 2-4 puffs by mouth every 4 hours as needed for  wheezing, cough, and/or shortness of breath 12/09/22   Loleta Rose, MD  azithromycin (ZITHROMAX) 250 MG tablet Take 2 tablets PO on day 1, then take 1 tablet PO daily for 4 more days 12/09/22   Loleta Rose, MD  chlorpheniramine-HYDROcodone (TUSSIONEX) 10-8 MG/5ML Take 5 mLs by mouth every 12 (twelve) hours as needed for cough. 12/09/22   Loleta Rose, MD  DULoxetine (CYMBALTA) 30 MG capsule Take by mouth. 07/11/22   [provider]  gabapentin (NEURONTIN) 300 MG capsule Take by mouth. 05/22/22   [provider]  lidocaine (LMX) 4 % cream Apply topically 3 (three) times daily as needed (low back and hip pain). 05/20/22   Pennie Banter, DO  metFORMIN (GLUCOPHAGE-XR) 500 MG 24 hr tablet Take by mouth. 08/13/22 08/13/23  [provider]  methocarbamol (ROBAXIN) 500 MG tablet Take 1 tablet (500 mg total) by mouth 4 (four) times daily. 10/06/22   Cuthriell, Delorise Royals, PA-C  naproxen (NAPROSYN) 500 MG tablet Take 1 tablet (500 mg total) by mouth 2 (two) times daily with a meal. 05/19/22   Esaw Grandchild A, DO  polyethylene glycol (MIRALAX / GLYCOLAX) 17 g packet Take 17 g by mouth daily. 05/20/22   Pennie Banter, DO  predniSONE (DELTASONE) 10 MG tablet Take 4 tabs (40 mg) PO x 3 days, then take 2 tabs (20 mg) PO x 3 days, then take 1 tab (10 mg) PO x 3 days, then take 1/2 tab (5 mg) PO x 4 days. 12/09/22   Loleta Rose, MD  pregabalin (LYRICA) 200 MG capsule Take 1 capsule (200 mg total) by mouth 2 (two) times daily. 05/20/22   Pennie Banter, DO  Semaglutide-Weight Management 1.7 MG/0.75ML SOAJ Inject into the skin. 05/10/23   [provider]  senna-docusate (SENOKOT-S) 8.6-50 MG tablet Take 1 tablet by mouth 2 (two) times daily. 05/19/22   Pennie Banter, DO     Allergies  Patient has no known allergies.   Family History   Family History  Problem Relation Age of Onset   Arthritis Mother    Diabetes Mother        type 2   Breast cancer Sister 65        pat 1/2 sister     Physical Exam  Triage Vital Signs: ED Triage Vitals  Encounter Vitals Group     BP 11/28/23 2313 116/73     Systolic BP Percentile --      Diastolic BP Percentile --      Pulse Rate 11/28/23 2313 87     Resp 11/28/23 2313 18     Temp 11/28/23 2313 (!) 97.5 F (36.4 C)     Temp Source 11/28/23 2313 Oral     SpO2 11/28/23 2313 97 %     Weight --      Height 11/28/23 2311 5\' 6"  (1.676 m)     Head Circumference --      Peak Flow --      Pain Score 11/28/23 2311 10  Pain Loc --      Pain Education --      Exclude from Growth Chart --     Updated Vital Signs: BP (!) 108/49   Pulse 70   Temp 98.1 F (36.7 C) (Oral)   Resp 16   Ht 5\' 6"  (1.676 m)   LMP 05/11/2022 (Exact Date)   SpO2 100%   BMI 54.72 kg/m    General: Awake, mild distress.  CV:  RRR.  Good peripheral perfusion.  Resp:  Normal effort.  CTAB. Abd:  Surgical site C/D/I.  Mild generalized tenderness to palpation without rebound or guarding.  No distention.  Other:  No truncal vesicles.   ED Results / Procedures / Treatments  Labs (all labs ordered are listed, but only abnormal results are displayed) Labs Reviewed  LIPASE, BLOOD - Abnormal; Notable for the following components:      Result Value   Lipase 65 (*)    All other components within normal limits  COMPREHENSIVE METABOLIC PANEL - Abnormal; Notable for the following components:   Sodium 134 (*)    CO2 16 (*)    All other components within normal limits  CBC - Abnormal; Notable for the following components:   Hemoglobin 15.2 (*)    All other components within normal limits  URINALYSIS, ROUTINE W REFLEX MICROSCOPIC - Abnormal; Notable for the following components:   Color, Urine YELLOW (*)    APPearance HAZY (*)    Specific Gravity, Urine >1.046 (*)    Ketones, ur 20 (*)    All other components within normal limits     EKG  None   RADIOLOGY I have independently visualized and interpreted patient's imaging  study as well as noted the radiology interpretation:  CT abdomen/pelvis: No evidence for post operative complication  Official radiology report(s): CT ABDOMEN PELVIS W CONTRAST Result Date: 11/29/2023 CLINICAL DATA:  Postop abdominal pain. 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 EXAM: CT ABDOMEN AND PELVIS WITH CONTRAST TECHNIQUE: Multidetector CT imaging of the abdomen and pelvis was performed using the standard protocol following bolus administration of intravenous contrast. RADIATION DOSE REDUCTION: This exam was performed according to the departmental dose-optimization program which includes automated exposure control, adjustment of the mA and/or kV according to patient size and/or use of iterative reconstruction technique. CONTRAST:  OMNIPAQUE IOHEXOL 350 MG/ML SOLN COMPARISON:  11/16/2018 FINDINGS: Lower chest: Unremarkable. Hepatobiliary: No suspicious focal abnormality within the liver parenchyma. Gallbladder unremarkable although the wall of the gallbladder is obscured by motion artifact. No intrahepatic or extrahepatic biliary dilation. Pancreas: No focal mass lesion. No dilatation of the main duct. No intraparenchymal cyst. No peripancreatic edema. Spleen: No splenomegaly. No suspicious focal mass lesion. Adrenals/Urinary Tract: No adrenal nodule or mass. Kidneys unremarkable. No evidence for hydroureter. The urinary bladder appears normal for the degree of distention. Stomach/Bowel: Surgical changes in the stomach compatible with the reported history of sleeve gastrectomy. There is some minimal stranding around the antral region of the stomach, in keeping with the recent surgery. Duodenum is normally positioned as is the ligament of Treitz. No small bowel wall thickening. No small bowel dilatation. The terminal ileum is normal. The appendix is not well visualized, but there is no edema or inflammation in the region of the cecal tip to suggest appendicitis. No gross colonic mass. No colonic  wall thickening. Vascular/Lymphatic: No abdominal aortic aneurysm. No abdominal aortic atherosclerotic calcification. There is no gastrohepatic or hepatoduodenal ligament lymphadenopathy. No retroperitoneal or mesenteric lymphadenopathy. No pelvic sidewall lymphadenopathy. Reproductive:  Hysterectomy.  There is no adnexal mass. Other: No substantial intraperitoneal free fluid. Musculoskeletal: Gas is identified in the subcutaneous fat of the left paramidline anterior abdominal wall, potentially related to the recent surgery or recent injection. If this is related to surgery, it is not unexpected within 7-10 days of the procedure and is only a very trace/minimal amount of gas in the subcutaneous fat. No worrisome lytic or sclerotic osseous abnormality. IMPRESSION: 1. Surgical changes in the stomach compatible with the reported history of sleeve gastrectomy. No evidence for postoperative complication. 2. Gas in the subcutaneous fat of the left paramidline anterior abdominal wall, potentially related to the recent surgery or recent injection. If this is related to surgery, it is not unexpected within 7-10 days of the procedure and is only a very trace/minimal amount of gas in the subcutaneous fat. 3. Otherwise no acute findings to explain the patient's history of pain. Electronically Signed   By: Kennith Center M.D.   On: 11/29/2023 05:17     PROCEDURES:  Critical Care performed: No  .1-3 Lead EKG Interpretation  Performed by: Irean Hong, MD Authorized by: Irean Hong, MD     Interpretation: normal     ECG rate:  85   ECG rate assessment: normal     Rhythm: sinus rhythm     Ectopy: none     Conduction: normal   Comments:     Patient placed on cardiac monitor to evaluate for arrhythmias    MEDICATIONS ORDERED IN ED: Medications  oxyCODONE (ROXICODONE) 5 MG/5ML solution 10 mg (has no administration in time range)  sodium chloride 0.9 % bolus 1,000 mL (1,000 mLs Intravenous New Bag/Given 11/29/23  0209)  ondansetron (ZOFRAN) injection 4 mg (4 mg Intravenous Given 11/29/23 0205)  HYDROmorphone (DILAUDID) injection 0.5 mg (0.5 mg Intravenous Given 11/29/23 0207)  iohexol (OMNIPAQUE) 350 MG/ML injection 100 mL (100 mLs Intravenous Contrast Given 11/29/23 0224)  morphine (PF) 4 MG/ML injection 4 mg (4 mg Intravenous Given 11/29/23 0250)  diazepam (VALIUM) injection 2 mg (2 mg Intravenous Given 11/29/23 0249)  famotidine (PEPCID) IVPB 20 mg premix (0 mg Intravenous Stopped 11/29/23 0324)     IMPRESSION / MDM / ASSESSMENT AND PLAN / ED COURSE  I reviewed the triage vital signs and the nursing notes.                             43 year old female presenting with postoperative pain and nausea. Differential diagnosis includes, but is not limited to, ovarian cyst, ovarian torsion, acute appendicitis, diverticulitis, urinary tract infection/pyelonephritis, endometriosis, bowel obstruction, colitis, renal colic, gastroenteritis, hernia, etc. I personally reviewed patient's records and note her recent hospitalization from 11/20/2023.  Patient's presentation is most consistent with acute complicated illness / injury requiring diagnostic workup.  The patient is on the cardiac monitor to evaluate for evidence of arrhythmia and/or significant heart rate changes.  Laboratory results unremarkable other than minimally elevated lipase.  Awaiting UA.  Will administer IV fluid hydration, IV Dilaudid for pain paired with IV Zofran for nausea.  Will obtain CT abdomen/pelvis and reassess.  Clinical Course as of 11/29/23 0543  Fri Nov 29, 2023  0301 Additional medications ordered for continued pain. [JS]  0503 Delay due to radiology.  Patient resting in no acute distress. [JS]  0540 Updated patient on CT scan results.  Patient has liquid oxycodone from her surgeon.  Will add liquid Valium as this did add benefit to  her symptomatically for additional pain control.  Strict return precautions given.  Patient verbalizes  understanding and agrees with plan of care. [JS]    Clinical Course User Index [JS] Irean Hong, MD     FINAL CLINICAL IMPRESSION(S) / ED DIAGNOSES   Final diagnoses:  Abdominal pain, unspecified abdominal location  Nausea  Dehydration     Rx / DC Orders   ED Discharge Orders          Ordered    diazepam (VALIUM) 1 MG/ML solution  Every 8 hours PRN        11/29/23 0536    lactulose (CHRONULAC) 10 GM/15ML solution  Daily PRN        11/29/23 0542             Note:  This document was prepared using Dragon voice recognition software and may include unintentional dictation errors.   Irean Hong, MD 11/29/23 [phone]

## 2023-11-29 NOTE — ED Notes (Signed)
 3 mg of valium wasted by this RN and Raquel Onalee Hua as witness. Pt already removed from board and unable to pull up on pyxis. Pharmacy made aware.

## 2023-11-29 NOTE — Discharge Instructions (Addendum)
 Continue to take the pain medicine as prescribed by your doctor.  You may add liquid Valium for additional pain control and muscle relaxation.  Take Lactulose as needed for bowel movements.  Return to the ER for worsening symptoms, persistent vomiting, difficulty breathing or other concerns.

## 2024-03-12 ENCOUNTER — Emergency Department

## 2024-03-12 ENCOUNTER — Other Ambulatory Visit: Payer: Self-pay

## 2024-03-12 ENCOUNTER — Emergency Department
Admission: EM | Admit: 2024-03-12 | Discharge: 2024-03-13 | Disposition: A | Discharge status: Home / Self Care | Attending: Emergency Medicine | Admitting: Emergency Medicine

## 2024-03-12 ENCOUNTER — Encounter: Payer: Self-pay | Admitting: *Deleted

## 2024-03-12 DIAGNOSIS — R1010 Upper abdominal pain, unspecified: Secondary | ICD-10-CM

## 2024-03-12 DIAGNOSIS — K760 Fatty (change of) liver, not elsewhere classified: Secondary | ICD-10-CM | POA: Insufficient documentation

## 2024-03-12 DIAGNOSIS — K644 Residual hemorrhoidal skin tags: Secondary | ICD-10-CM | POA: Diagnosis not present

## 2024-03-12 DIAGNOSIS — R1013 Epigastric pain: Secondary | ICD-10-CM | POA: Diagnosis present

## 2024-03-12 LAB — LIPASE, BLOOD: Lipase: 38 U/L (ref 11–51)

## 2024-03-12 LAB — COMPREHENSIVE METABOLIC PANEL WITH GFR
ALT: 27 U/L (ref 0–44)
AST: 30 U/L (ref 15–41)
Albumin: 4 g/dL (ref 3.5–5.0)
Alkaline Phosphatase: 65 U/L (ref 38–126)
Anion gap: 8 (ref 5–15)
BUN: 16 mg/dL (ref 6–20)
CO2: 23 mmol/L (ref 22–32)
Calcium: 9.6 mg/dL (ref 8.9–10.3)
Chloride: 107 mmol/L (ref 98–111)
Creatinine, Ser: 0.71 mg/dL (ref 0.44–1.00)
GFR, Estimated: >60 mL/min (ref >60)
Glucose, Bld: 111 mg/dL — ABNORMAL HIGH (ref 70–99)
Potassium: 3.8 mmol/L (ref 3.5–5.1)
Sodium: 138 mmol/L (ref 135–145)
Total Bilirubin: 1.1 mg/dL (ref 0.0–1.2)
Total Protein: 7.8 g/dL (ref 6.5–8.1)

## 2024-03-12 LAB — CBC
HCT: 39.4 % (ref 36.0–46.0)
Hemoglobin: 13.6 g/dL (ref 12.0–15.0)
MCH: 31.9 pg (ref 26.0–34.0)
MCHC: 34.5 g/dL (ref 30.0–36.0)
MCV: 92.3 fL (ref 80.0–100.0)
Platelets: 195 10*3/uL (ref 150–400)
RBC: 4.27 MIL/uL (ref 3.87–5.11)
RDW: 12.9 % (ref 11.5–15.5)
WBC: 6.8 10*3/uL (ref 4.0–10.5)
nRBC: 0 % (ref 0.0–0.2)

## 2024-03-12 MED ORDER — MORPHINE SULFATE (PF) 4 MG/ML IV SOLN
4.0000 mg | Freq: Once | INTRAVENOUS | Status: AC
  Administered 2024-03-13: 4 mg via INTRAVENOUS
  Filled 2024-03-12: qty 1

## 2024-03-12 MED ORDER — ONDANSETRON HCL 4 MG/2ML IJ SOLN
4.0000 mg | Freq: Once | INTRAMUSCULAR | Status: AC
  Administered 2024-03-13: 4 mg via INTRAVENOUS
  Filled 2024-03-12: qty 2

## 2024-03-12 MED ORDER — SODIUM CHLORIDE 0.9 % IV BOLUS (SEPSIS)
1000.0000 mL | Freq: Once | INTRAVENOUS | Status: AC
  Administered 2024-03-13: 1000 mL via INTRAVENOUS

## 2024-03-12 MED ORDER — LIDOCAINE-PRILOCAINE 2.5-2.5 % EX CREA
TOPICAL_CREAM | Freq: Once | CUTANEOUS | Status: AC
  Filled 2024-03-12: qty 5

## 2024-03-12 NOTE — ED Provider Notes (Signed)
 [facility] Provider Note    Event Date/Time   First MD Initiated Contact with Patient 03/12/24 2301     (approximate)   History   Abdominal Pain   HPI  Layali Freund is a 43 y.o. female with history of pseudotumor cerebri, obesity status post gastric sleeve at [facility] 11/20/23, hysterectomy unilateral LEFT salpingectomy, tubal ligation, paratubal cystectomy who presents to the emergency department with complaints of epigastric and right upper quadrant abdominal pain, nausea without vomiting.  Also reports she has not had a bowel movement in 2 to 3 days and has been straining causing rectal pain and bright red blood when she wipes.  She states she does not think she has been passing gas normally.  She denies any known fever but has had chills.  Denies dysuria, hematuria, vaginal bleeding or discharge.  States her daughter drove her to the emergency department.   She does report that she has had issues with pain, nausea since the surgery in February.  She states she is only able to eat a few bites of food before becoming symptomatic but states that this feels different today.  History provided by patient.    Past Medical History:  Diagnosis Date   Migraine    currently active, uses Excedrin   Pseudotumor cerebri     Past Surgical History:  Procedure Laterality Date   ATTEMPTED BTL-HAD TO ABORT PROCEDURE  10-2014   CYST EXCISION     EXCISION MASS UPPER EXTREMETIES Right 03/14/2018   Procedure: EXCISION ELBOW MASS;  Surgeon: Josephus Nida, MD;  Location: [facility] ORS;  Service: Orthopedics;  Laterality: Right;   GANGLION CYST EXCISION Left 12/26/2015   Procedure: REMOVAL GANGLION OF WRIST;  Surgeon: Marlynn Singer, MD;  Location: [facility] ORS;  Service: Orthopedics;  Laterality: Left;   GANGLION CYST EXCISION Left 04/20/2016   Procedure: REMOVAL GANGLION OF WRIST;  Surgeon: Josephus Nida, MD;  Location: [facility] ORS;  Service: Orthopedics;  Laterality: Left;   IR  INJECT/THERA/INC NEEDLE/CATH/PLC EPI/LUMB/SAC W/IMG  05/15/2022   IR INJECT/THERA/INC NEEDLE/CATH/PLC EPI/LUMB/SAC W/IMG  05/15/2022   LAPAROSCOPIC OVARIAN CYSTECTOMY Left 04/14/2018   Procedure: LAPAROSCOPIC OVARIAN CYSTECTOMY;  Surgeon: Heron Lord, MD;  Location: [facility] ORS;  Service: Gynecology;  Laterality: Left;  peritubal cyst   LAPAROSCOPIC TUBAL LIGATION Bilateral 03/01/2015   Procedure: LAPAROSCOPIC TUBAL LIGATION;  Surgeon: Kris Pester, MD;  Location: [facility] ORS;  Service: Gynecology;  Laterality: Bilateral;   LAPAROSCOPIC UNILATERAL SALPINGECTOMY Left 04/14/2018   Procedure: LAPAROSCOPIC UNILATERAL SALPINGECTOMY;  Surgeon: Heron Lord, MD;  Location: [facility] ORS;  Service: Gynecology;  Laterality: Left;   MOUTH SURGERY     TUBAL LIGATION      MEDICATIONS:  Prior to Admission medications   Medication Sig Start Date End Date Taking? Authorizing Provider  acetaminophen  (TYLENOL ) 500 MG tablet Take 2 tablets (1,000 mg total) by mouth 3 (three) times daily. 05/19/22   Montey Apa, DO  acetaminophen  (TYLENOL ) 500 MG tablet Take by mouth.    [provider]  albuterol  (VENTOLIN  HFA) 108 (90 Base) MCG/ACT inhaler Inhale 2-4 puffs by mouth every 4 hours as needed for wheezing, cough, and/or shortness of breath 12/09/22   Lynnda Sas, MD  azithromycin  (ZITHROMAX ) 250 MG tablet Take 2 tablets PO on day 1, then take 1 tablet PO daily for 4 more days 12/09/22   Lynnda Sas, MD  chlorpheniramine-HYDROcodone  (TUSSIONEX) 10-8 MG/5ML Take 5 mLs by mouth every 12 (twelve) hours as needed for cough. 12/09/22  Lynnda Sas, MD  diazepam  (VALIUM ) 1 MG/ML solution Take 2 mLs (2 mg total) by mouth every 8 (eight) hours as needed for anxiety or muscle spasms. 11/29/23   Sung, Jade J, MD  DULoxetine  (CYMBALTA ) 30 MG capsule Take by mouth. 07/11/22   [provider]  gabapentin  (NEURONTIN ) 300 MG capsule Take by mouth. 05/22/22   [provider]  lactulose   (CHRONULAC ) 10 GM/15ML solution Take 30 mLs (20 g total) by mouth daily as needed for mild constipation. 11/29/23   Sung, Jade J, MD  lidocaine  (LMX) 4 % cream Apply topically 3 (three) times daily as needed (low back and hip pain). 05/20/22   Montey Apa, DO  metFORMIN (GLUCOPHAGE-XR) 500 MG 24 hr tablet Take by mouth. 08/13/22 08/13/23  [provider]  methocarbamol  (ROBAXIN ) 500 MG tablet Take 1 tablet (500 mg total) by mouth 4 (four) times daily. 10/06/22   Cuthriell, Ardath Bears, PA-C  naproxen  (NAPROSYN ) 500 MG tablet Take 1 tablet (500 mg total) by mouth 2 (two) times daily with a meal. 05/19/22   Darus Engels A, DO  polyethylene glycol (MIRALAX  / GLYCOLAX ) 17 g packet Take 17 g by mouth daily. 05/20/22   Montey Apa, DO  predniSONE  (DELTASONE ) 10 MG tablet Take 4 tabs (40 mg) PO x 3 days, then take 2 tabs (20 mg) PO x 3 days, then take 1 tab (10 mg) PO x 3 days, then take 1/2 tab (5 mg) PO x 4 days. 12/09/22   Lynnda Sas, MD  pregabalin  (LYRICA ) 200 MG capsule Take 1 capsule (200 mg total) by mouth 2 (two) times daily. 05/20/22   Montey Apa, DO  Semaglutide-Weight Management 1.7 MG/0.75ML SOAJ Inject into the skin. 05/10/23   [provider]  senna-docusate (SENOKOT-S) 8.6-50 MG tablet Take 1 tablet by mouth 2 (two) times daily. 05/19/22   Montey Apa, DO    Physical Exam   Triage Vital Signs: ED Triage Vitals  Encounter Vitals Group     BP 03/12/24 2118 (!) 123/93     Girls Systolic BP Percentile --      Girls Diastolic BP Percentile --      Boys Systolic BP Percentile --      Boys Diastolic BP Percentile --      Pulse Rate 03/12/24 2118 80     Resp 03/12/24 2118 18     Temp 03/12/24 2118 97.7 F (36.5 C)     Temp Source 03/12/24 2118 Oral     SpO2 03/12/24 2118 95 %     Weight 03/12/24 2116 272 lb (123.4 kg)     Height 03/12/24 2116 5' 6 (1.676 m)     Head Circumference --      Peak Flow --      Pain Score 03/12/24 2116 10     Pain  Loc --      Pain Education --      Exclude from Growth Chart --     Most recent vital signs: Vitals:   03/12/24 2118 03/13/24 0136  BP: (!) 123/93 (!) 108/59  Pulse: 80 66  Resp: 18 20  Temp: 97.7 F (36.5 C)   SpO2: 95% 100%    CONSTITUTIONAL: Alert, responds appropriately to questions. Well-appearing; well-nourished HEAD: Normocephalic, atraumatic EYES: Conjunctivae clear, pupils appear equal, sclera nonicteric ENT: normal nose; moist mucous membranes NECK: Supple, normal ROM CARD: RRR; S1 and S2 appreciated RESP: Normal chest excursion without splinting or tachypnea; breath sounds clear and  equal bilaterally; no wheezes, no rhonchi, no rales, no hypoxia or respiratory distress, speaking full sentences ABD/GI: Non-distended; soft, tender throughout the right upper quadrant with positive Murphy sign, tender in the epigastric region, mild tenderness in the lower abdomen RECTAL: Patient has 2 small external hemorrhoids that are nonthrombosed and not bleeding.  She is very tender on external rectal exam with scant amount of blood noted.  No melena.  Unable to perform internal exam due to the level of discomfort. BACK: The back appears normal EXT: Normal ROM in all joints; no deformity noted, no edema SKIN: Normal color for age and race; warm; no rash on exposed skin NEURO: Moves all extremities equally, normal speech PSYCH: The patient's mood and manner are appropriate.   ED Results / Procedures / Treatments   LABS: (all labs ordered are listed, but only abnormal results are displayed) Labs Reviewed  COMPREHENSIVE METABOLIC PANEL WITH GFR - Abnormal; Notable for the following components:      Result Value   Glucose, Bld 111 (*)    All other components within normal limits  URINALYSIS, ROUTINE W REFLEX MICROSCOPIC - Abnormal; Notable for the following components:   Color, Urine YELLOW (*)    APPearance HAZY (*)    Specific Gravity, Urine 1.038 (*)    Hgb urine dipstick SMALL  (*)    Bacteria, UA RARE (*)    All other components within normal limits  LIPASE, BLOOD  CBC     EKG:  EKG Interpretation Date/Time:    Ventricular Rate:    PR Interval:    QRS Duration:    QT Interval:    QTC Calculation:   R Axis:      Text Interpretation:           RADIOLOGY: My personal review and interpretation of imaging: CT scan shows no acute abnormality.  Ultrasound shows normal gallbladder.  I have personally reviewed all radiology reports.   CT ABDOMEN PELVIS W CONTRAST Result Date: 03/13/2024 CLINICAL DATA:  Acute nonlocalized abdominal pain EXAM: CT ABDOMEN AND PELVIS WITH CONTRAST TECHNIQUE: Multidetector CT imaging of the abdomen and pelvis was performed using the standard protocol following bolus administration of intravenous contrast. RADIATION DOSE REDUCTION: This exam was performed according to the departmental dose-optimization program which includes automated exposure control, adjustment of the mA and/or kV according to patient size and/or use of iterative reconstruction technique. CONTRAST:  OMNIPAQUE  IOHEXOL  350 MG/ML SOLN COMPARISON:  None Available. FINDINGS: Lower chest: No acute abnormality. Hepatobiliary: No focal liver abnormality is seen. No gallstones, gallbladder wall thickening, or biliary dilatation. Pancreas: Unremarkable Spleen: Unremarkable Adrenals/Urinary Tract: Adrenal glands are unremarkable. Kidneys are normal, without renal calculi, focal lesion, or hydronephrosis. Bladder is unremarkable. Stomach/Bowel: Surgical changes of gastric sleeve resection are identified. Stomach, small bowel, and large bowel are otherwise unremarkable. Status post appendectomy. No free intraperitoneal gas or fluid. Vascular/Lymphatic: No significant vascular findings are present. No enlarged abdominal or pelvic lymph nodes. Reproductive: Status post hysterectomy. No adnexal masses. Other: No abdominal wall hernia or abnormality. No abdominopelvic ascites.  Musculoskeletal: No acute or significant osseous findings. IMPRESSION: 1. No acute intra-abdominal pathology is identified. Status post gastric sleeve resection, appendectomy, and hysterectomy. Electronically Signed   By: Worthy Heads M.D.   On: 03/13/2024 01:51   US  ABDOMEN LIMITED RUQ (LIVER/GB) Result Date: 03/13/2024 EXAM: Right Upper Quadrant Abdominal Ultrasound 03/13/2024 12:54:00 AM TECHNIQUE: Real-time ultrasonography of the right upper quadrant of the abdomen was performed. COMPARISON: CT abdomen / pelvis dated  11/29/2023. CLINICAL HISTORY: RUQ abdominal pain. FINDINGS: LIVER: The liver demonstrates hyperechoic hepatic parenchyma, suggesting hepatic steatosis. No intrahepatic biliary ductal dilatation. No evidence of mass. BILIARY SYSTEM: No pericholecystic fluid or wall thickening. No cholelithiasis. Negative sonographic Murphy's sign. Common bile duct measures 6 mm. OTHER: No right upper quadrant ascites. IMPRESSION: 1. Hepatic steatosis. Electronically signed by: Zadie Herter MD 03/13/2024 01:06 AM EDT RP Workstation: [id number]     PROCEDURES:  Critical Care performed: No     Procedures    IMPRESSION / MDM / ASSESSMENT AND PLAN / ED COURSE  I reviewed the triage vital signs and the nursing notes.    Patient here with upper abdominal pain, nausea, chills.   DIFFERENTIAL DIAGNOSIS (includes but not limited to):   Cholelithiasis, cholecystitis, pancreatitis, gastritis, GERD, bowel obstruction, appendicitis, colitis   Patient's presentation is most consistent with acute presentation with potential threat to life or bodily function.   PLAN: Labs showed normal renal function, LFTs, lipase.  No leukocytosis.  Urine pending.  Will obtain right upper quadrant ultrasound.  Will give IV fluids, pain and nausea medicine.  Discussed with patient if ultrasound is unremarkable, will proceed with CT of the abdomen pelvis.   MEDICATIONS GIVEN IN ED: Medications  sodium  chloride 0.9 % bolus 1,000 mL (0 mLs Intravenous Stopped 03/13/24 0113)  ondansetron  (ZOFRAN ) injection 4 mg (4 mg Intravenous Given 03/13/24 0011)  morphine  (PF) 4 MG/ML injection 4 mg (4 mg Intravenous Given 03/13/24 0012)  lidocaine -prilocaine (EMLA) cream ( Topical Given 03/13/24 0015)  iohexol  (OMNIPAQUE ) 350 MG/ML injection 100 mL (100 mLs Intravenous Contrast Given 03/13/24 0121)  HYDROmorphone  (DILAUDID ) injection 1 mg (1 mg Intravenous Given 03/13/24 0149)  pantoprazole (PROTONIX) injection 40 mg (40 mg Intravenous Given 03/13/24 0149)  dicyclomine  (BENTYL ) capsule 20 mg (20 mg Oral Given 03/13/24 0204)     ED COURSE: 1:10 AM  Ultrasound reviewed and interpreted by myself and the radiologist and shows hepatic steatosis.  No other acute abnormality noted.  Will proceed with CT of the abdomen pelvis.  2:20 AM  Pt's CT scan reviewed and interpreted by myself and the radiologist and shows no acute abnormality.  Radiologist commented that she is status post appendectomy but patient denies having a previous appendectomy.  She does not have any tenderness in the right lower quadrant and there is no inflammatory changes seen around this area on CT imaging.  No significant constipation noted on CT.  No bowel obstruction.  No fecal impaction.  She is requesting to take something to help with bowel movements.  Recommended that she start with increased fluid and fiber intake, MiraLAX , Colace but if this is unsuccessful, will provide with prescription of lactulose  as it appears she has taken this before.  We discussed that she can use lidocaine  cream as needed for her rectal pain.  Recommended we avoid narcotics as this could worsen constipation.  Recommended Tylenol , Bentyl , Zofran  as needed.  Encouraged her to follow-up closely with her outpatient providers.  Patient appears more comfortable.  She is hemodynamically stable, tolerating p.o.  I feel she is safe for outpatient management.   At this time, I do  not feel there is any life-threatening condition present. I reviewed all nursing notes, vitals, pertinent previous records.  All lab and urine results, EKGs, imaging ordered have been independently reviewed and interpreted by myself.  I reviewed all available radiology reports from any imaging ordered this visit.  Based on my assessment, I feel the patient is safe to  be discharged home without further emergent workup and can continue workup as an outpatient as needed. Discussed all findings, treatment plan as well as usual and customary return precautions.  They verbalize understanding and are comfortable with this plan.  Outpatient follow-up has been provided as needed.  All questions have been answered.   CONSULTS:  none   OUTSIDE RECORDS REVIEWED: Reviewed recent surgical and PCP notes at [facility].       FINAL CLINICAL IMPRESSION(S) / ED DIAGNOSES   Final diagnoses:  Upper abdominal pain  Hepatic steatosis     Rx / DC Orders   ED Discharge Orders          Ordered    lactulose  (CHRONULAC ) 10 GM/15ML solution  Daily PRN        03/13/24 0203    dicyclomine  (BENTYL ) 20 MG tablet  Every 8 hours PRN        03/13/24 0203    ondansetron  (ZOFRAN -ODT) 4 MG disintegrating tablet  Every 6 hours PRN        03/13/24 0203    lidocaine -prilocaine (EMLA) cream  As needed        03/13/24 0203             Note:  This document was prepared using Dragon voice recognition software and may include unintentional dictation errors.   Abhiraj Dozal, Clover Dao, DO 03/13/24 0222

## 2024-03-12 NOTE — ED Triage Notes (Signed)
 Pt ambulatory to triage.  Pt has abd pain since this morning. Hx gastric sleeve 2.5 months ago.  Pt states last bm was 2 days ago.  No vomiting  pt has nausea.  Pt alert.

## 2024-03-13 ENCOUNTER — Emergency Department

## 2024-03-13 LAB — URINALYSIS, ROUTINE W REFLEX MICROSCOPIC
Bilirubin Urine: NEGATIVE
Glucose, UA: NEGATIVE mg/dL
Ketones, ur: NEGATIVE mg/dL
Leukocytes,Ua: NEGATIVE
Nitrite: NEGATIVE
Protein, ur: NEGATIVE mg/dL
Specific Gravity, Urine: 1.038 — ABNORMAL HIGH (ref 1.005–1.030)
pH: 5 (ref 5.0–8.0)

## 2024-03-13 MED ORDER — HYDROMORPHONE HCL 1 MG/ML IJ SOLN
1.0000 mg | Freq: Once | INTRAMUSCULAR | Status: AC
  Administered 2024-03-13: 1 mg via INTRAVENOUS
  Filled 2024-03-13: qty 1

## 2024-03-13 MED ORDER — ONDANSETRON 4 MG PO TBDP: 4.0000 mg | ORAL_TABLET | Freq: Four times a day (QID) | ORAL | 0 refills | Status: AC | PRN

## 2024-03-13 MED ORDER — DICYCLOMINE HCL 10 MG PO CAPS
20.0000 mg | ORAL_CAPSULE | Freq: Once | ORAL | Status: AC
  Administered 2024-03-13: 20 mg via ORAL
  Filled 2024-03-13: qty 2

## 2024-03-13 MED ORDER — IOHEXOL 350 MG/ML SOLN
100.0000 mL | Freq: Once | INTRAVENOUS | Status: AC | PRN
Start: 2024-03-13 — End: 2024-03-13
  Administered 2024-03-13: 100 mL via INTRAVENOUS

## 2024-03-13 MED ORDER — LIDOCAINE-PRILOCAINE 2.5-2.5 % EX CREA: 1.0000 | TOPICAL_CREAM | CUTANEOUS | 0 refills | Status: AC | PRN

## 2024-03-13 MED ORDER — DICYCLOMINE HCL 20 MG PO TABS: 20.0000 mg | ORAL_TABLET | Freq: Three times a day (TID) | ORAL | 0 refills | Status: AC | PRN

## 2024-03-13 MED ORDER — LACTULOSE 10 GM/15ML PO SOLN: 20.0000 g | Freq: Every day | ORAL | 0 refills | Status: AC | PRN

## 2024-03-13 MED ORDER — PANTOPRAZOLE SODIUM 40 MG IV SOLR
40.0000 mg | Freq: Once | INTRAVENOUS | Status: AC
  Administered 2024-03-13: 40 mg via INTRAVENOUS
  Filled 2024-03-13: qty 10

## 2024-03-13 NOTE — Discharge Instructions (Addendum)
 I commend close follow-up with your surgeon and primary care doctor.  You may take over-the-counter Tylenol  1000 mg every 6 hours as needed for fever, pain.  I recommend that you increase your water  and fiber intake. If you are not able to eat foods high in fiber, you may use Benefiber or Metamucil over-the-counter.   I also recommend you use MiraLAX  1-2 times a day and Colace 100 mg twice a day to help with bowel movements. These medications are over the counter.  You may also use over-the-counter Fleet enemas daily as needed.  Please note that some of these medications may cause you to have abdominal cramping which is normal. If you develop severe abdominal pain, fever (temperature of 100.4 or higher), persistent vomiting, distention of your abdomen, unable to have a bowel movement for 5 days or are not passing gas, please return to the hospital.  If MiraLAX , Colace are not helping with your constipation, you may use the prescription lactulose .  Your labs, urine, CT of your abdomen pelvis and right upper quadrant ultrasound were reassuring.  Your ultrasound did show signs of fatty liver but this would not cause any pain.

## 2024-08-14 NOTE — Progress Notes (Deleted)
 [facility] [address] [place], [place] [address]  Pulmonary Sleep Medicine   Office Visit Note  Patient Name: Kristi Gutierrez DOB: [birth year] MRN [id number]    Chief Complaint: Obstructive Sleep Apnea visit  Brief History:  Kristi Gutierrez is seen today for an annual follow up visit for APAP@ 8-15 cmH2O. The patient has a 1.5 year history of sleep apnea. Patient is not using PAP nightly.  The patient feels *** after sleeping with PAP.  The patient reports *** from PAP use. Reported sleepiness is  *** and the Epworth Sleepiness Score is *** out of 24. The patient *** take naps. The patient complains of the following: ***  The compliance download shows  compliance with an average use time of *** hours. The AHI is ***  The patient *** of limb movements disrupting sleep. The patient continues to require PAP therapy in order to eliminate sleep apnea.   ROS  General: (-) fever, (-) chills, (-) night sweat Nose and Sinuses: (-) nasal stuffiness or itchiness, (-) postnasal drip, (-) nosebleeds, (-) sinus trouble. Mouth and Throat: (-) sore throat, (-) hoarseness. Neck: (-) swollen glands, (-) enlarged thyroid, (-) neck pain. Respiratory: *** cough, *** shortness of breath, *** wheezing. Neurologic: *** numbness, *** tingling. Psychiatric: *** anxiety, *** depression   Current Medication: Outpatient Encounter Medications as of 08/17/2024  Medication Sig   acetaminophen  (TYLENOL ) 500 MG tablet Take 2 tablets (1,000 mg total) by mouth 3 (three) times daily.   acetaminophen  (TYLENOL ) 500 MG tablet Take by mouth.   albuterol  (VENTOLIN  HFA) 108 (90 Base) MCG/ACT inhaler Inhale 2-4 puffs by mouth every 4 hours as needed for wheezing, cough, and/or shortness of breath   azithromycin  (ZITHROMAX ) 250 MG tablet Take 2 tablets PO on day 1, then take 1 tablet PO daily for 4 more days   chlorpheniramine-HYDROcodone  (TUSSIONEX) 10-8 MG/5ML Take 5 mLs by mouth every 12 (twelve) hours as  needed for cough.   diazepam  (VALIUM ) 1 MG/ML solution Take 2 mLs (2 mg total) by mouth every 8 (eight) hours as needed for anxiety or muscle spasms.   dicyclomine  (BENTYL ) 20 MG tablet Take 1 tablet (20 mg total) by mouth every 8 (eight) hours as needed.   DULoxetine  (CYMBALTA ) 30 MG capsule Take by mouth.   gabapentin  (NEURONTIN ) 300 MG capsule Take by mouth.   lactulose  (CHRONULAC ) 10 GM/15ML solution Take 30 mLs (20 g total) by mouth daily as needed for mild constipation.   lidocaine  (LMX) 4 % cream Apply topically 3 (three) times daily as needed (low back and hip pain).   lidocaine -prilocaine  (EMLA ) cream Apply 1 Application topically as needed.   metFORMIN (GLUCOPHAGE-XR) 500 MG 24 hr tablet Take by mouth.   methocarbamol  (ROBAXIN ) 500 MG tablet Take 1 tablet (500 mg total) by mouth 4 (four) times daily.   naproxen  (NAPROSYN ) 500 MG tablet Take 1 tablet (500 mg total) by mouth 2 (two) times daily with a meal.   ondansetron  (ZOFRAN -ODT) 4 MG disintegrating tablet Take 1 tablet (4 mg total) by mouth every 6 (six) hours as needed for nausea or vomiting.   polyethylene glycol (MIRALAX  / GLYCOLAX ) 17 g packet Take 17 g by mouth daily.   predniSONE  (DELTASONE ) 10 MG tablet Take 4 tabs (40 mg) PO x 3 days, then take 2 tabs (20 mg) PO x 3 days, then take 1 tab (10 mg) PO x 3 days, then take 1/2 tab (5 mg) PO x 4 days.   pregabalin  (LYRICA ) 200  MG capsule Take 1 capsule (200 mg total) by mouth 2 (two) times daily.   Semaglutide-Weight Management 1.7 MG/0.75ML SOAJ Inject into the skin.   senna-docusate (SENOKOT-S) 8.6-50 MG tablet Take 1 tablet by mouth 2 (two) times daily.   No facility-administered encounter medications on file as of 08/17/2024.    Surgical History: Past Surgical History:  Procedure Laterality Date   ATTEMPTED BTL-HAD TO ABORT PROCEDURE  10-2014   CYST EXCISION     EXCISION MASS UPPER EXTREMETIES Right 03/14/2018   Procedure: EXCISION ELBOW MASS;  Surgeon: Maryl Barters,  MD;  Location: [facility];  Service: Orthopedics;  Laterality: Right;   GANGLION CYST EXCISION Left 12/26/2015   Procedure: REMOVAL GANGLION OF WRIST;  Surgeon: Kayla Pinal, MD;  Location: [facility];  Service: Orthopedics;  Laterality: Left;   GANGLION CYST EXCISION Left 04/20/2016   Procedure: REMOVAL GANGLION OF WRIST;  Surgeon: Barters Maryl, MD;  Location: [facility];  Service: Orthopedics;  Laterality: Left;   IR INJECT/THERA/INC NEEDLE/CATH/PLC EPI/LUMB/SAC W/IMG  05/15/2022   IR INJECT/THERA/INC NEEDLE/CATH/PLC EPI/LUMB/SAC W/IMG  05/15/2022   LAPAROSCOPIC OVARIAN CYSTECTOMY Left 04/14/2018   Procedure: LAPAROSCOPIC OVARIAN CYSTECTOMY;  Surgeon: Victor Claudell SAUNDERS, MD;  Location: [facility];  Service: Gynecology;  Laterality: Left;  peritubal cyst   LAPAROSCOPIC TUBAL LIGATION Bilateral 03/01/2015   Procedure: LAPAROSCOPIC TUBAL LIGATION;  Surgeon: Garnette JONETTA Mace, MD;  Location: [facility];  Service: Gynecology;  Laterality: Bilateral;   LAPAROSCOPIC UNILATERAL SALPINGECTOMY Left 04/14/2018   Procedure: LAPAROSCOPIC UNILATERAL SALPINGECTOMY;  Surgeon: Victor Claudell SAUNDERS, MD;  Location: [facility];  Service: Gynecology;  Laterality: Left;   MOUTH SURGERY     TUBAL LIGATION      Medical History: Past Medical History:  Diagnosis Date   Migraine    currently active, uses Excedrin   Pseudotumor cerebri     Family History: Non contributory to the present illness  Social History: Social History   Socioeconomic History   Marital status: Married    Spouse name: Not on file   Number of children: Not on file   Years of education: Not on file   Highest education level: Not on file  Occupational History   Not on file  Tobacco Use   Smoking status: Never   Smokeless tobacco: Never  Vaping Use   Vaping status: Never Used  Substance and Sexual Activity   Alcohol use: Not Currently    Comment: occ   Drug use: No   Sexual activity: Yes    Birth control/protection: Other-see comments     Comment: tubal ligation  Other Topics Concern   Not on file  Social History Narrative   Not on file   Social Drivers of Health   Financial Resource Strain: Low Risk (01/08/2024)   Received from [facility]   Overall Financial Resource Strain (CARDIA)    Difficulty of Paying Living Expenses: Not hard at all  Food Insecurity: No Food Insecurity (01/08/2024)   Received [facility]   Hunger Vital Sign    Within the past 12 months, you worried that your food would run out before you got the money to buy more.: Never true    Within the past 12 months, the food you bought just didn't last and you didn't have money to get more.: Never true  Transportation Needs: No Transportation Needs (01/08/2024)   Received from [facility]   PRAPARE - Transportation    Lack of Transportation (Medical): No    Lack of  Transportation (Non-Medical): No  Physical Activity: Not on file  Stress: Not on file  Social Connections: Not on file  Intimate Partner Violence: Not on file    Vital Signs: Last menstrual period 05/11/2022. There is no height or weight on file to calculate BMI.    Examination: General Appearance: The patient is well-developed, well-nourished, and in no distress. Neck Circumference: 46 cm Skin: Gross inspection of skin unremarkable. Head: normocephalic, no gross deformities. Eyes: no gross deformities noted. ENT: ears appear grossly normal Neurologic: Alert and oriented. No involuntary movements.  STOP BANG RISK ASSESSMENT S (snore) Have you been told that you snore?     YES/N   T (tired) Are you often tired, fatigued, or sleepy during the day?   YES/NO  O (obstruction) Do you stop breathing, choke, or gasp during sleep? YES/NO   P (pressure) Do you have or are you being treated for high blood pressure? YES/NO   B (BMI) Is your body index greater than 35 kg/m? YES   A (age) Are you 57 years old or older? NO   N (neck) Do you have a neck circumference  greater than 16 inches?   YES   G (gender) Are you a female? NO   TOTAL STOP/BANG "YES" ANSWERS        A STOP-Bang score of 2 or less is considered low risk, and a score of 5 or more is high risk for having either moderate or severe OSA. For people who score 3 or 4, doctors may need to perform further assessment to determine how likely they are to have OSA.         EPWORTH SLEEPINESS SCALE:  Scale:  (0)= no chance of dozing; (1)= slight chance of dozing; (2)= moderate chance of dozing; (3)= high chance of dozing  Chance  Situtation    Sitting and reading: ***    Watching TV: ***    Sitting Inactive in public: ***    As a passenger in car: ***      Lying down to rest: ***    Sitting and talking: ***    Sitting quielty after lunch: ***    In a car, stopped in traffic: ***   TOTAL SCORE:   *** out of 24    SLEEP STUDIES:  PSG (10/2022) AHI 30.1/hr, REM AHI 80/hr, limited supine sleep- Supine AHI 125/hr, min SpO2 72%. Titration (11/2022) APAP@ 8-15 cmH2O   CPAP COMPLIANCE DATA:  Date Range: ***  Average Daily Use: *** hours  Median Use: ***  Compliance for > 4 Hours: *** days  AHI: *** respiratory events per hour  Days Used: ***  Mask Leak: ***  95th Percentile Pressure: ***         LABS: No results found for this or any previous visit (from the past 2160 hours).  Radiology: CT ABDOMEN PELVIS W CONTRAST Result Date: 03/13/2024 CLINICAL DATA:  Acute nonlocalized abdominal pain EXAM: CT ABDOMEN AND PELVIS WITH CONTRAST TECHNIQUE: Multidetector CT imaging of the abdomen and pelvis was performed using the standard protocol following bolus administration of intravenous contrast. RADIATION DOSE REDUCTION: This exam was performed according to the departmental dose-optimization program which includes automated exposure control, adjustment of the mA and/or kV according to patient size and/or use of iterative reconstruction technique. CONTRAST:   OMNIPAQUE  IOHEXOL  350 MG/ML SOLN COMPARISON:  None Available. FINDINGS: Lower chest: No acute abnormality. Hepatobiliary: No focal liver abnormality is seen. No gallstones, gallbladder wall thickening, or biliary dilatation. Pancreas:  Unremarkable Spleen: Unremarkable Adrenals/Urinary Tract: Adrenal glands are unremarkable. Kidneys are normal, without renal calculi, focal lesion, or hydronephrosis. Bladder is unremarkable. Stomach/Bowel: Surgical changes of gastric sleeve resection are identified. Stomach, small bowel, and large bowel are otherwise unremarkable. Status post appendectomy. No free intraperitoneal gas or fluid. Vascular/Lymphatic: No significant vascular findings are present. No enlarged abdominal or pelvic lymph nodes. Reproductive: Status post hysterectomy. No adnexal masses. Other: No abdominal wall hernia or abnormality. No abdominopelvic ascites. Musculoskeletal: No acute or significant osseous findings. IMPRESSION: 1. No acute intra-abdominal pathology is identified. Status post gastric sleeve resection, appendectomy, and hysterectomy. Electronically Signed   By: Dorethia Molt M.D.   On: 03/13/2024 01:51   US  ABDOMEN LIMITED RUQ (LIVER/GB) Result Date: 03/13/2024 EXAM: Right Upper Quadrant Abdominal Ultrasound 03/13/2024 12:54:00 AM TECHNIQUE: Real-time ultrasonography of the right upper quadrant of the abdomen was performed. COMPARISON: CT abdomen / pelvis dated 11/29/2023. CLINICAL HISTORY: RUQ abdominal pain. FINDINGS: LIVER: The liver demonstrates hyperechoic hepatic parenchyma, suggesting hepatic steatosis. No intrahepatic biliary ductal dilatation. No evidence of mass. BILIARY SYSTEM: No pericholecystic fluid or wall thickening. No cholelithiasis. Negative sonographic Murphy's sign. Common bile duct measures 6 mm. OTHER: No right upper quadrant ascites. IMPRESSION: 1. Hepatic steatosis. Electronically signed by: Pinkie Pebbles MD 03/13/2024 01:06 AM EDT RP Workstation: [id number]     No results found.  No results found.    Assessment and Plan: Patient Active Problem List   Diagnosis Date Noted   OSA (obstructive sleep apnea) 10/22/2022   Dental caries 07/18/2022   Morbid obesity with BMI of 50.0-59.9, adult (HCC) 05/15/2022   Herniated intervertebral disc of lumbar spine 05/15/2022   Ambulatory dysfunction 05/15/2022   Intractable pain 05/15/2022   Fall at home, initial encounter 05/15/2022   Left lumbar radiculopathy 05/15/2022   Migraine    Benign ganglioneuroma of abdomen 10/13/2019   Intractable back pain 04/28/2018   Morbid obesity (HCC) 09/30/2017   CRP elevated 03/21/2016   Asymptomatic hyperuricemia 03/21/2016   Admission for sterilization 03/01/2015      The patient *** tolerate PAP and reports *** benefit from PAP use. The patient was reminded how to *** and advised to ***. The patient was also counselled on ***. The compliance is ***. The AHI is ***.   ***  General Counseling: I have discussed the findings of the evaluation and examination with Kristi Gutierrez.  I have also discussed any further diagnostic evaluation thatmay be needed or ordered today. Kristi Gutierrez verbalizes understanding of the findings of todays visit. We also reviewed her medications today and discussed drug interactions and side effects including but not limited excessive drowsiness and altered mental states. We also discussed that there is always a risk not just to her but also people around her. she has been encouraged to call the office with any questions or concerns that should arise related to todays visit.  No orders of the defined types were placed in this encounter.       I have personally obtained a history, examined the patient, evaluated laboratory and imaging results, formulated the assessment and plan and placed orders.  Kristi DELENA Bathe, MD [facility] Diplomate ABMS Pulmonary Critical Care Medicine and Sleep Medicine

## 2024-08-17 ENCOUNTER — Ambulatory Visit

## 2024-10-26 ENCOUNTER — Emergency Department: Admission: EM | Admit: 2024-10-26 | Discharge: 2024-10-26 | Disposition: A | Discharge status: Home / Self Care

## 2024-10-26 ENCOUNTER — Emergency Department

## 2024-10-26 ENCOUNTER — Other Ambulatory Visit: Payer: Self-pay

## 2024-10-26 DIAGNOSIS — R1010 Upper abdominal pain, unspecified: Secondary | ICD-10-CM

## 2024-10-26 DIAGNOSIS — L03012 Cellulitis of left finger: Secondary | ICD-10-CM | POA: Insufficient documentation

## 2024-10-26 DIAGNOSIS — R1013 Epigastric pain: Secondary | ICD-10-CM | POA: Insufficient documentation

## 2024-10-26 LAB — COMPREHENSIVE METABOLIC PANEL WITH GFR
ALT: 11 U/L (ref 0–44)
AST: 15 U/L (ref 15–41)
Albumin: 4.1 g/dL (ref 3.5–5.0)
Alkaline Phosphatase: 89 U/L (ref 38–126)
Anion gap: 11 (ref 5–15)
BUN: 14 mg/dL (ref 6–20)
CO2: 25 mmol/L (ref 22–32)
Calcium: 9.4 mg/dL (ref 8.9–10.3)
Chloride: 106 mmol/L (ref 98–111)
Creatinine, Ser: 0.87 mg/dL (ref 0.44–1.00)
GFR, Estimated: >60 mL/min
Glucose, Bld: 93 mg/dL (ref 70–99)
Potassium: 4.3 mmol/L (ref 3.5–5.1)
Sodium: 142 mmol/L (ref 135–145)
Total Bilirubin: 0.3 mg/dL (ref 0.0–1.2)
Total Protein: 7.5 g/dL (ref 6.5–8.1)

## 2024-10-26 LAB — CBC
HCT: 39.5 % (ref 36.0–46.0)
Hemoglobin: 13.3 g/dL (ref 12.0–15.0)
MCH: 30.6 pg (ref 26.0–34.0)
MCHC: 33.7 g/dL (ref 30.0–36.0)
MCV: 91 fL (ref 80.0–100.0)
Platelets: 195 10*3/uL (ref 150–400)
RBC: 4.34 MIL/uL (ref 3.87–5.11)
RDW: 12.3 % (ref 11.5–15.5)
WBC: 8 10*3/uL (ref 4.0–10.5)
nRBC: 0 % (ref 0.0–0.2)

## 2024-10-26 LAB — LIPASE, BLOOD: Lipase: 38 U/L (ref 11–51)

## 2024-10-26 MED ORDER — IOHEXOL 350 MG/ML SOLN
100.0000 mL | Freq: Once | INTRAVENOUS | Status: AC | PRN
  Administered 2024-10-26: 100 mL via INTRAVENOUS

## 2024-10-26 MED ORDER — ONDANSETRON HCL 4 MG/2ML IJ SOLN
4.0000 mg | Freq: Once | INTRAMUSCULAR | Status: AC
  Administered 2024-10-26: 4 mg via INTRAVENOUS
  Filled 2024-10-26: qty 2

## 2024-10-26 MED ORDER — AMOXICILLIN-POT CLAVULANATE 875-125 MG PO TABS: 1.0000 | ORAL_TABLET | Freq: Two times a day (BID) | ORAL | 0 refills | Status: AC

## 2024-10-26 MED ORDER — SODIUM CHLORIDE 0.9 % IV BOLUS
1000.0000 mL | Freq: Once | INTRAVENOUS | Status: AC
  Administered 2024-10-26: 1000 mL via INTRAVENOUS

## 2024-10-26 MED ORDER — MORPHINE SULFATE (PF) 4 MG/ML IV SOLN
4.0000 mg | Freq: Once | INTRAVENOUS | Status: AC
  Administered 2024-10-26: 4 mg via INTRAVENOUS
  Filled 2024-10-26: qty 1

## 2024-10-26 MED ORDER — AMOXICILLIN-POT CLAVULANATE 875-125 MG PO TABS
1.0000 | ORAL_TABLET | Freq: Once | ORAL | Status: AC
  Administered 2024-10-26: 1 via ORAL
  Filled 2024-10-26: qty 1

## 2024-10-26 NOTE — ED Notes (Signed)
 See triage note. Pt unable to hold down food for the last two days. Last bowel movement was a couple days ago. Pt states normal for her is twice a week after gastric sleeve placement. Pt A&Ox4, appears uncomfortable, ambulatory without assistance at time of assessment.

## 2024-10-26 NOTE — ED Triage Notes (Signed)
 Pt comes in via pov with complaints of abdominal pain the past couple of days. Pt states that she hasn't been able to keep anything down. Pt complains of lower abdominal pain 9/10 at this time. Pt had gastric sleeve surgery about 10 months ago, pt unsure if that is what is causing her pain.
